# Patient Record
Sex: Female | Born: 1950
Health system: Southern US, Community
[De-identification: ages and names within clinical notes are randomized; demographics above are authoritative.]

## PROBLEM LIST (undated history)

## (undated) DIAGNOSIS — I10 Essential (primary) hypertension: Secondary | ICD-10-CM

## (undated) DIAGNOSIS — H269 Unspecified cataract: Secondary | ICD-10-CM

## (undated) DIAGNOSIS — E119 Type 2 diabetes mellitus without complications: Secondary | ICD-10-CM

## (undated) DIAGNOSIS — K5790 Diverticulosis of intestine, part unspecified, without perforation or abscess without bleeding: Secondary | ICD-10-CM

## (undated) DIAGNOSIS — G4730 Sleep apnea, unspecified: Secondary | ICD-10-CM

## (undated) DIAGNOSIS — F419 Anxiety disorder, unspecified: Secondary | ICD-10-CM

## (undated) DIAGNOSIS — M1712 Unilateral primary osteoarthritis, left knee: Secondary | ICD-10-CM

## (undated) DIAGNOSIS — F329 Major depressive disorder, single episode, unspecified: Secondary | ICD-10-CM

## (undated) DIAGNOSIS — F32A Depression, unspecified: Secondary | ICD-10-CM

## (undated) DIAGNOSIS — T7840XA Allergy, unspecified, initial encounter: Secondary | ICD-10-CM

## (undated) DIAGNOSIS — IMO0001 Reserved for inherently not codable concepts without codable children: Secondary | ICD-10-CM

## (undated) DIAGNOSIS — K219 Gastro-esophageal reflux disease without esophagitis: Secondary | ICD-10-CM

## (undated) HISTORY — DX: Allergy, unspecified, initial encounter: T78.40XA

## (undated) HISTORY — PX: JOINT REPLACEMENT: SHX530

## (undated) HISTORY — DX: Reserved for inherently not codable concepts without codable children: IMO0001

## (undated) HISTORY — DX: Unspecified cataract: H26.9

## (undated) HISTORY — DX: Anxiety disorder, unspecified: F41.9

## (undated) HISTORY — DX: Essential (primary) hypertension: I10

## (undated) HISTORY — DX: Diverticulosis of intestine, part unspecified, without perforation or abscess without bleeding: K57.90

## (undated) HISTORY — DX: Sleep apnea, unspecified: G47.30

---

## 2003-12-22 HISTORY — PX: BILATERAL SALPINGOOPHORECTOMY: SHX1223

## 2003-12-22 HISTORY — PX: CYSTOCELE REPAIR: SHX163

## 2003-12-22 HISTORY — PX: ABDOMINAL HYSTERECTOMY: SHX81

## 2005-08-27 ENCOUNTER — Ambulatory Visit: Payer: Self-pay | Admitting: Family Medicine

## 2005-10-21 LAB — HM COLONOSCOPY

## 2006-02-03 ENCOUNTER — Ambulatory Visit: Payer: Self-pay | Admitting: Family Medicine

## 2006-09-09 ENCOUNTER — Ambulatory Visit: Payer: Self-pay | Admitting: Family Medicine

## 2006-09-10 ENCOUNTER — Ambulatory Visit (HOSPITAL_COMMUNITY): Admission: RE | Admit: 2006-09-10 | Discharge: 2006-09-10 | Payer: Self-pay | Admitting: Family Medicine

## 2006-09-28 DIAGNOSIS — F339 Major depressive disorder, recurrent, unspecified: Secondary | ICD-10-CM

## 2006-09-28 HISTORY — DX: Major depressive disorder, recurrent, unspecified: F33.9

## 2006-10-06 ENCOUNTER — Ambulatory Visit: Payer: Self-pay | Admitting: Family Medicine

## 2006-12-29 ENCOUNTER — Encounter: Payer: Self-pay | Admitting: Family Medicine

## 2007-01-20 ENCOUNTER — Ambulatory Visit: Payer: Self-pay | Admitting: Family Medicine

## 2007-02-14 ENCOUNTER — Ambulatory Visit: Payer: Self-pay | Admitting: Family Medicine

## 2007-02-28 ENCOUNTER — Telehealth: Payer: Self-pay | Admitting: Family Medicine

## 2007-07-05 ENCOUNTER — Ambulatory Visit: Payer: Self-pay | Admitting: Family Medicine

## 2007-07-05 DIAGNOSIS — R002 Palpitations: Secondary | ICD-10-CM | POA: Insufficient documentation

## 2007-07-05 HISTORY — DX: Palpitations: R00.2

## 2007-07-06 ENCOUNTER — Encounter: Payer: Self-pay | Admitting: Family Medicine

## 2007-07-06 LAB — CONVERTED CEMR LAB
ALT: 18 units/L (ref 0–35)
AST: 16 units/L (ref 0–37)
Albumin: 4.4 g/dL (ref 3.5–5.2)
Alkaline Phosphatase: 61 units/L (ref 39–117)
BUN: 18 mg/dL (ref 6–23)
CO2: 25 meq/L (ref 19–32)
Calcium: 9.6 mg/dL (ref 8.4–10.5)
Chloride: 105 meq/L (ref 96–112)
Cholesterol, target level: 200 mg/dL
Cholesterol: 208 mg/dL — ABNORMAL HIGH (ref 0–200)
Creatinine, Ser: 0.95 mg/dL (ref 0.40–1.20)
Glucose, Bld: 94 mg/dL (ref 70–99)
HCT: 44 % (ref 36.0–46.0)
HDL goal, serum: 40 mg/dL
HDL: 58 mg/dL (ref >39)
Hemoglobin: 14.1 g/dL (ref 12.0–15.0)
LDL Cholesterol: 114 mg/dL — ABNORMAL HIGH (ref 0–99)
LDL Goal: 160 mg/dL
MCHC: 32 g/dL (ref 30.0–36.0)
MCV: 90.9 fL (ref 78.0–100.0)
Platelets: 273 10*3/uL (ref 150–400)
Potassium: 4.6 meq/L (ref 3.5–5.3)
RBC: 4.84 M/uL (ref 3.87–5.11)
RDW: 14.3 % — ABNORMAL HIGH (ref 11.5–14.0)
Sodium: 142 meq/L (ref 135–145)
TSH: 0.906 microintl units/mL (ref 0.350–5.50)
Total Bilirubin: 0.4 mg/dL (ref 0.3–1.2)
Total CHOL/HDL Ratio: 3.6
Total Protein: 7.2 g/dL (ref 6.0–8.3)
Triglycerides: 180 mg/dL — ABNORMAL HIGH (ref <150)
VLDL: 36 mg/dL (ref 0–40)
WBC: 6.7 10*3/uL (ref 4.0–10.5)

## 2007-07-14 ENCOUNTER — Encounter: Admission: RE | Admit: 2007-07-14 | Discharge: 2007-07-14 | Payer: Self-pay | Admitting: Family Medicine

## 2007-08-02 ENCOUNTER — Encounter: Admission: RE | Admit: 2007-08-02 | Discharge: 2007-08-02 | Payer: Self-pay | Admitting: Family Medicine

## 2007-09-19 ENCOUNTER — Ambulatory Visit: Payer: Self-pay | Admitting: Family Medicine

## 2007-10-04 ENCOUNTER — Ambulatory Visit: Payer: Self-pay | Admitting: Family Medicine

## 2008-02-16 ENCOUNTER — Ambulatory Visit: Payer: Self-pay | Admitting: Family Medicine

## 2008-02-16 LAB — CONVERTED CEMR LAB
Bilirubin Urine: NEGATIVE
Glucose, Urine, Semiquant: NEGATIVE
Ketones, urine, test strip: NEGATIVE
Nitrite: NEGATIVE
Protein, U semiquant: NEGATIVE
Specific Gravity, Urine: 1.025
Urobilinogen, UA: NEGATIVE
WBC Urine, dipstick: NEGATIVE
pH: 7

## 2008-02-17 ENCOUNTER — Encounter: Payer: Self-pay | Admitting: Family Medicine

## 2008-03-07 ENCOUNTER — Telehealth: Payer: Self-pay | Admitting: Family Medicine

## 2008-03-08 ENCOUNTER — Ambulatory Visit: Payer: Self-pay | Admitting: Family Medicine

## 2008-05-09 ENCOUNTER — Ambulatory Visit: Payer: Self-pay | Admitting: Family Medicine

## 2008-07-24 ENCOUNTER — Encounter: Payer: Self-pay | Admitting: Family Medicine

## 2008-07-24 ENCOUNTER — Telehealth: Payer: Self-pay | Admitting: Family Medicine

## 2008-07-24 DIAGNOSIS — E785 Hyperlipidemia, unspecified: Secondary | ICD-10-CM

## 2008-07-24 HISTORY — DX: Hyperlipidemia, unspecified: E78.5

## 2008-07-25 LAB — CONVERTED CEMR LAB
Cholesterol: 242 mg/dL — ABNORMAL HIGH (ref 0–200)
HDL: 61 mg/dL (ref >39)
LDL Cholesterol: 142 mg/dL — ABNORMAL HIGH (ref 0–99)
Total CHOL/HDL Ratio: 4
Triglycerides: 195 mg/dL — ABNORMAL HIGH (ref <150)
VLDL: 39 mg/dL (ref 0–40)

## 2008-08-06 ENCOUNTER — Ambulatory Visit: Payer: Self-pay | Admitting: Family Medicine

## 2008-08-20 ENCOUNTER — Encounter: Admission: RE | Admit: 2008-08-20 | Discharge: 2008-08-20 | Payer: Self-pay | Admitting: Family Medicine

## 2008-08-24 ENCOUNTER — Encounter: Admission: RE | Admit: 2008-08-24 | Discharge: 2008-08-24 | Payer: Self-pay | Admitting: Family Medicine

## 2008-09-25 ENCOUNTER — Ambulatory Visit: Payer: Self-pay | Admitting: Family Medicine

## 2008-10-01 ENCOUNTER — Telehealth: Payer: Self-pay | Admitting: Family Medicine

## 2008-10-11 ENCOUNTER — Telehealth: Payer: Self-pay | Admitting: Family Medicine

## 2009-01-03 ENCOUNTER — Ambulatory Visit: Payer: Self-pay | Admitting: Family Medicine

## 2009-02-04 ENCOUNTER — Ambulatory Visit: Payer: Self-pay | Admitting: Family Medicine

## 2009-02-12 ENCOUNTER — Telehealth: Payer: Self-pay | Admitting: Family Medicine

## 2009-06-25 ENCOUNTER — Ambulatory Visit: Payer: Self-pay | Admitting: Family Medicine

## 2009-06-26 ENCOUNTER — Telehealth: Payer: Self-pay | Admitting: Family Medicine

## 2009-06-27 ENCOUNTER — Encounter: Payer: Self-pay | Admitting: Family Medicine

## 2009-06-27 LAB — CONVERTED CEMR LAB
Clue Cells Wet Prep HPF POC: NONE SEEN
Trich, Wet Prep: NONE SEEN
Yeast Wet Prep HPF POC: NONE SEEN

## 2009-08-05 ENCOUNTER — Ambulatory Visit: Payer: Self-pay | Admitting: Family Medicine

## 2009-08-05 LAB — CONVERTED CEMR LAB
Bilirubin Urine: NEGATIVE
Glucose, Urine, Semiquant: NEGATIVE
Ketones, urine, test strip: NEGATIVE
Nitrite: NEGATIVE
Protein, U semiquant: NEGATIVE
Specific Gravity, Urine: 1.02
Urobilinogen, UA: 0.2
WBC Urine, dipstick: NEGATIVE
pH: 7

## 2009-08-06 ENCOUNTER — Encounter: Payer: Self-pay | Admitting: Family Medicine

## 2009-11-01 ENCOUNTER — Encounter: Admission: RE | Admit: 2009-11-01 | Discharge: 2009-11-01 | Payer: Self-pay | Admitting: Family Medicine

## 2009-12-11 ENCOUNTER — Ambulatory Visit: Payer: Self-pay | Admitting: Family Medicine

## 2009-12-11 ENCOUNTER — Encounter: Admission: RE | Admit: 2009-12-11 | Discharge: 2009-12-11 | Payer: Self-pay | Admitting: Family Medicine

## 2009-12-17 ENCOUNTER — Encounter: Payer: Self-pay | Admitting: Family Medicine

## 2010-01-12 ENCOUNTER — Encounter: Payer: Self-pay | Admitting: Family Medicine

## 2010-01-21 HISTORY — PX: SPINE SURGERY: SHX786

## 2010-01-24 ENCOUNTER — Telehealth: Payer: Self-pay | Admitting: Family Medicine

## 2010-05-29 ENCOUNTER — Ambulatory Visit: Payer: Self-pay | Admitting: Family

## 2010-06-24 ENCOUNTER — Telehealth: Payer: Self-pay

## 2010-06-27 ENCOUNTER — Telehealth (INDEPENDENT_AMBULATORY_CARE_PROVIDER_SITE_OTHER): Payer: Self-pay | Admitting: *Deleted

## 2010-11-05 ENCOUNTER — Ambulatory Visit: Payer: Self-pay | Admitting: Family Medicine

## 2010-11-05 DIAGNOSIS — N390 Urinary tract infection, site not specified: Secondary | ICD-10-CM | POA: Insufficient documentation

## 2010-11-14 ENCOUNTER — Encounter: Admission: RE | Admit: 2010-11-14 | Discharge: 2010-11-14 | Payer: Self-pay | Admitting: Family Medicine

## 2010-11-14 LAB — HM MAMMOGRAPHY: HM Mammogram: NEGATIVE

## 2010-12-23 ENCOUNTER — Ambulatory Visit
Admission: RE | Admit: 2010-12-23 | Discharge: 2010-12-23 | Payer: Self-pay | Source: Home / Self Care | Attending: Family Medicine | Admitting: Family Medicine

## 2010-12-23 DIAGNOSIS — J019 Acute sinusitis, unspecified: Secondary | ICD-10-CM | POA: Insufficient documentation

## 2011-01-11 ENCOUNTER — Encounter: Payer: Self-pay | Admitting: Family Medicine

## 2011-01-12 ENCOUNTER — Encounter: Payer: Self-pay | Admitting: Family Medicine

## 2011-01-20 NOTE — Assessment & Plan Note (Signed)
Summary: UTI, HRT   Vital Signs:  Patient profile:   60 year old female Height:      66 inches Weight:      210 pounds Pulse rate:   82 / minute BP sitting:   125 / 72  (right arm) Cuff size:   large  Vitals Entered By: Avon Gully CMA, Duncan Dull) (November 05, 2010 8:22 AM) CC: left kidney hurts x several days   Primary Care Provider:  Nani Gasser MD  CC:  left kidney hurts x several days.  History of Present Illness: left kidney hurts x several days. on the left side. Pain is intermittant. Feels better after drinks a pain.  Pain is a dull ache. yesterday pain was a 6/10. Today pain is 2/10.  Sometimes happens when gets urinary tract infection. No history of stones.  No blood in her urine. Change in positions don't bother her.l  Heal makes it feelt better. +urinary frequency and urgency.  No fever.    INterested in HRT. No family hx of breast cancer.   GEts her mammogram. Having hot flashes, hair changes, and vaginal drynesss. She has tried vaginal treatment alone but says it really doesn't treat her other sxs and would like to try oral tx.    Current Medications (verified): 1)  Paroxetine Hcl 20 Mg Tabs (Paroxetine Hcl) .... One and 1/2 By Mouth Daily 2)  Multivitamins  Tabs (Multiple Vitamin) .... Take 1 Tablet By Mouth Once A Day 3)  Caltrate 600+d 600-400 Mg-Unit Tabs (Calcium Carbonate-Vitamin D) .... One By Mouth Two Times A Day 4)  Flonase 50 Mcg/act  Susp (Fluticasone Propionate) .... 2 Sprays Per Nostril Daily 5)  Fish Oil 1000 Mg Caps (Omega-3 Fatty Acids) .... Take One Tablet By Mouth Once A Day 6)  Glucosamine-Chondroitin  Caps (Glucosamine-Chondroit-Vit C-Mn) .... Take One Capsule By Mouth Once A Day  Allergies (verified): 1)  ! Codeine  Comments:  Nurse/Medical Assistant: The patient's medications and allergies were reviewed with the patient and were updated in the Medication and Allergy Lists. Avon Gully CMA, Duncan Dull) (November 05, 2010 8:25  AM)  Past History:  Past Surgical History: Last updated: 05/29/2010 Cystocele bladder tack -2005  TAH/BSO -2005 by Dr. Kevin Fenton Lumbar laminectomy 2/11 (Dr. Manson Passey)  Family History: Last updated: 02/16/2008 Father - Alcoholism, depression, PAD Grandmother - Brain Ca, Mother - HTN,  Uncle-stroke Father died from blood clot from thrombosis of the leg.   GF died MI age 89.    Physical Exam  General:  Well-developed,well-nourished,in no acute distress; alert,appropriate and cooperative throughout examination Msk:  Lumbar flexion, extension and rotation right and left.  Nontender over the left flank or low back.  No CVA tenderness.    Impression & Recommendations:  Problem # 1:  UTI (ICD-599.0)  The following medications were removed from the medication list:    Ceftin 500 Mg Tabs (Cefuroxime axetil) ..... One tablet by mouth two times a day x 10 days Her updated medication list for this problem includes:    Bactrim Ds 800-160 Mg Tabs (Sulfamethoxazole-trimethoprim) .Marland Kitchen... Take 1 tablet by mouth two times a day for 3 days.  Encouraged to push clear liquids, get enough rest, and take acetaminophen as needed. To be seen in 10 days if no improvement, sooner if worse.  Orders: UA Dipstick w/o Micro (automated)  (81003)  Problem # 2:  HRT (ICD-V07.4) We will start with estrogen replacement since she has had a hystrectomy.  F/U in 2 months. We also discussed  bioidenticals which she is somewhat interested in.   Discussed inc risk of BrCa and will need to get her yearly mammogram.    Complete Medication List: 1)  Paroxetine Hcl 20 Mg Tabs (Paroxetine hcl) .... One and 1/2 by mouth daily 2)  Multivitamins Tabs (Multiple vitamin) .... Take 1 tablet by mouth once a day 3)  Caltrate 600+d 600-400 Mg-unit Tabs (Calcium carbonate-vitamin d) .... One by mouth two times a day 4)  Flonase 50 Mcg/act Susp (Fluticasone propionate) .... 2 sprays per nostril daily 5)  Fish Oil 1000 Mg Caps  (Omega-3 fatty acids) .... Take one tablet by mouth once a day 6)  Glucosamine-chondroitin Caps (Glucosamine-chondroit-vit c-mn) .... Take one capsule by mouth once a day 7)  Estrace 1 Mg Tabs (Estradiol) .... Take 1 tablet by mouth once a day 8)  Bactrim Ds 800-160 Mg Tabs (Sulfamethoxazole-trimethoprim) .... Take 1 tablet by mouth two times a day for 3 days.  Patient Instructions: 1)  Follow in about 2 months for HRT.   2)  Can read about bioidenticals.  Prescriptions: BACTRIM DS 800-160 MG TABS (SULFAMETHOXAZOLE-TRIMETHOPRIM) Take 1 tablet by mouth two times a day for 3 days.  #6 x 0   Entered and Authorized by:   Nani Gasser MD   Signed by:   Nani Gasser MD on 11/05/2010   Method used:   [facility]        [facility] [phone][facility] (retail[facility]       [address] [place], [place]  [address]       Ph: [phone] or [phone]       Fax: [phone]   RxID:   [id number] ESTRACE 1 MG TABS (ESTRADIOL) Take 1 tablet by mouth once a day  #30 x 2   Entered and Authorized by:   Nani Gasser MD   Signed by:   Nani Gasser MD on 11/05/2010   Method used:   [facility] to        [facility] [phone]* (retail)       [address] [place], [place]  [address]       Ph: [phone] or [phone]       Fax: [phone]   RxID:   [id number]    Orders Added: 1)  UA Dipstick w/o Micro (automated)  [81003] 2)  Est. Patient Level IV [61607]   Immunization History:  Influenza Immunization History:    Influenza:  historical (09/22/2010)   Immunization History:  Influenza Immunization History:    Influenza:  Historical (09/22/2010)  Laboratory Results   Urine Tests  Date/Time Received: 11/05/10 Date/Time Reported: 11/05/10         Immunization History:  Influenza Immunization History:    Influenza:  historical (09/22/2010)  Appended Document: UTI, HRT    Clinical Lists Changes  Observations: Added new  observation of WBC DIPSTK U: negative (11/05/2010 8:45) Added new observation of NITRITE URN: negative (11/05/2010 8:45) Added new observation of UROBILINOGEN: 0.2  (11/05/2010 8:45) Added new observation of PROTEIN, URN: negative  (11/05/2010 8:45) Added new observation of PH URINE: 7.0  (11/05/2010 8:45) Added new observation of BLOOD UR DIP: trace-intact  (11/05/2010 8:45) Added new observation of SPEC GR URIN: 1.020  (11/05/2010 8:45) Added new observation of KETONES URN: negative  (11/05/2010 8:45) Added new observation of BILIRUBIN UR: negative  (11/05/2010 8:45) Added new observation of GLUCOSE, URN: negative  (11/05/2010 8:45)  Added new observation of APPEARANCE U: Clear  (11/05/2010 8:45) Added new observation of UA COLOR: yellow  (11/05/2010 8:45)      Laboratory Results   Urine Tests  Date/Time Received: 11/05/10 Date/Time Reported: 11/05/10  Routine Urinalysis   Color: yellow Appearance: Clear Glucose: negative   (Normal Range: Negative) Bilirubin: negative   (Normal Range: Negative) Ketone: negative   (Normal Range: Negative) Spec. Gravity: 1.020   (Normal Range: 1.003-1.035) Blood: trace-intact   (Normal Range: Negative) pH: 7.0   (Normal Range: 5.0-8.0) Protein: negative   (Normal Range: Negative) Urobilinogen: 0.2   (Normal Range: 0-1) Nitrite: negative   (Normal Range: Negative) Leukocyte Esterace: negative   (Normal Range: Negative)

## 2011-01-20 NOTE — Assessment & Plan Note (Signed)
Summary: sinus pain/cough   Vital Signs:  Patient Profile:   60 Years Old Female Height:     66 inches Weight:      211 pounds O2 Sat:      95 % Temp:     98.0 degrees F oral Pulse rate:   73 / minute BP sitting:   127 / 81  (left arm) Cuff size:   large  Vitals Entered By: Harlene Salts (February 14, 2007 10:34 AM)               PCP:  Cipriano Bunker  Chief Complaint:  Cold & URI symptoms.  History of Present Illness: 1 wk ago head congestion, runny nose cough, dry- Vicks at night no SOB, no CP dizzy maxillary sinus pain- Dayquil, fevers and chills good by mouth intake cough keeps her up at night.  Acute Visit History:      The patient complains of chest pain, cough, fever, headache, nasal discharge, and sinus problems.  These symptoms began 6 days ago.  She denies abdominal pain, diarrhea, earache, musculoskeletal symptoms, nausea, sore throat, and vomiting.  Other comments include: CP with cough only, Dayquil not helping much, hx of tobacco abuse, SOB with cough at night, sinus pressure in both cheeks.        Her highest temperature has been subj.  The fever has been off and on.        The cough interferes with her sleep.  The character of the cough is described as nonproductive.  She has no history of COPD.  There is no history of wheezing, shortness of breath, or interference with oral intake associated with her cough.        She complains of sinus pressure and nasal congestion.  The patient has had a past history of sinusitis.  She denies previous sinus surgery or sinusitis in the last 2 months.        Prior Medications: PAROXETINE HCL 20 MG TABS (PAROXETINE HCL) one and 1/2 by mouth daily MULTIVITAMINS  TABS (MULTIPLE VITAMIN) Take 1 tablet by mouth once a day Current Allergies: ! CODEINE      Review of Systems      See HPI   Physical Exam  General:     alert and well-hydrated.  in mild distress Head:     normocephalic and atraumatic.  maxillary sinuses  are TTP Eyes:     conjunctiva clear, EOMI and non-painful Ears:     TMs appear normal Nose:     clear rhinorrhea Mouth:     good dentition and pharynx pink and moist.  o/p injected, no exudates Neck:     no masses.   Lungs:     dry hacking cough, spliting, no W/R/R; no accessory muscle use.   Heart:     Normal rate and regular rhythm. S1 and S2 normal without gallop, murmur, click, rub or other extra sounds. Skin:     color normal and no rashes.   Cervical Nodes:     anterior cervical LA    Impression & Recommendations:  Problem # 1:  SINUSITIS, ACUTE NOS (ICD-461.9) Likely viral.  Disucssed supportive care.  Will give Zithromax Rx to start later this wk if needed, after 10 days of illness to take if sinus pain and congestion does not improve.   Her updated medication list for this problem includes:    Zithromax Z-pak 250 Mg Tabs (Azithromycin) .Marland Kitchen... Take as directed    Tussionex Pennkinetic Er 8-10  Mg/70ml Lqcr (Chlorpheniramine-hydrocodone) .Marland Kitchen... 1 tsp by mouth q 12 hr as needed cough   Problem # 2:  SYMPTOM, COUGH (ICD-786.2) Pulse ox 95% with hx of smoking.  Normal lung exam.  Likely viral as per #1.   Orders: Pulse Oximetry (94760)   Medications Added to Medication List This Visit: 1)  Caltrate 600+d 600-400 Mg-unit Tabs (Calcium carbonate-vitamin d) .... One by mouth two times a day 2)  Zithromax Z-pak 250 Mg Tabs (Azithromycin) .... Take as directed 3)  Tussionex Pennkinetic Er 8-10 Mg/28ml Lqcr (Chlorpheniramine-hydrocodone) .Marland Kitchen.. 1 tsp by mouth q 12 hr as needed cough 4)  Ventolin Hfa 108 (90 Base) Mcg/act Aers (Albuterol sulfate) .... 2 puffs q 4 hrs prn   Patient Instructions: 1)  Use RX cough medicine -- may make you drowsy.  do not combine with Dayquil. 2)  Can use Tylenol or Ibuprofen for headache as needed. 3)  Use inhaler every 4 hrs during the day for 10 days. 4)  Fill Zithromax on Wed if not getting better. 5)  F/u with Dr Linford Arnold in not better by 2  wks.

## 2011-01-20 NOTE — Progress Notes (Signed)
Summary: REFERRAL  Phone Note Call from Patient Call back at [phone]   Reason for Call: Referral Summary of Call: PATIENT WOULD LIKE A REFERRAL TO AN ENT FOR HER EAR PROBLEMS. Initial call taken by: Drinda Butts,  February 28, 2007 10:50 AM  Follow-up for Phone Call        Unaware of ear problem. Please get details.  Follow-up by: Nani Gasser MD,  February 28, 2007 11:06 AM  Additional Follow-up for Phone Call Additional follow up Details #1::        Called pt. she states feels like her right ear won't open-pressure in it. Says she can hear fluid in it when she blows her nose. Left ear hurts also bu not as bad as the right ear. Has never had ear problems before, using Advil for the pain. [facility] LPN @ 29:56OZ Additional Follow-up by: Kathlene November,  February 28, 2007 11:14 AM   Additional Follow-up for Phone Call Additional follow up Details #2::    Recommend try Claritin D to help dry up fluid, if feels confident there is not infection. If after 1 week, no better will refer to ENT, but it sounds like eustachain tube dysfucntion and this can take 2-4 weeks to get better.  This can often happen after a cold or virus.   Follow-up by: Nani Gasser MD,  February 28, 2007 11:23 AM  Additional Follow-up for Phone Call Additional follow up Details #3:: Details for Additional Follow-up Action Taken: Called pt. gave MD instructions to her. Will try the Claritin D for a week and will call back in 1 week if no better. [facility] LPN @ 30:86VH Additional Follow-up by: Kathlene November,  February 28, 2007 11:37 AM

## 2011-01-20 NOTE — Assessment & Plan Note (Signed)
Summary: Gastritis   Vital Signs:  Patient Profile:   60 Years Old Female Height:     66 inches Weight:      210 pounds BMI:     34.02 Temp:     97.1 degrees F oral Pulse rate:   63 / minute BP sitting:   122 / 77  (left arm) Cuff size:   large  Vitals Entered By: Harlene Salts (January 20, 2007 2:58 PM)                  PCP:  Cipriano Bunker  Chief Complaint:  diarrhea and N/V.  History of Present Illness:       This is a 60 years old female who presents with Diarrhea.  The symptoms began 4 days ago.  Started with nausea and vomiting. Both improved bu tnow increased stomach acid.  Took something years ago.  Still eating bland food.  Now increasing fluids.  Want something for the acid.    Prior Medications: PAROXETINE HCL 20 MG TABS (PAROXETINE HCL) one and 1/2 by mouth daily MULTIVITAMINS  TABS (MULTIPLE VITAMIN) Take 1 tablet by mouth once a day Current Allergies: ! CODEINE        Impression & Recommendations:  Problem # 1:  GASTRITIS, ACUTE (ICD-535.00) Will Use PPI for 3 weeks if not improved call. Increase fluids.

## 2011-01-20 NOTE — Progress Notes (Signed)
Summary: Yeast infection  Phone Note Call from Patient Call back at Home Phone [phone]   Caller: Patient Call For: Nani Gasser MD Summary of Call: Pt calls and has been on antibiotic by you and has a "raging" yeast infection. Has been using OTC Monistat but no help actually getting worse. Would like to know if you would call in [facility] [address] [facility] [place] Initial call taken by: Kathlene November,  February 12, 2009 10:16 AM  Follow-up for Phone Call        Rx sent.  REmind her take 2-3 days to work after takes the dose of diflucan Follow-up by: Nani Gasser MD,  February 12, 2009 10:46 AM    New/Updated Medications: FLUCONAZOLE 150 MG TABS (FLUCONAZOLE) Take 1 tablet by mouth once a day x 1   Prescriptions: FLUCONAZOLE 150 MG TABS (FLUCONAZOLE) Take 1 tablet by mouth once a day x 1  #1 x 1   Entered and Authorized by:   Nani Gasser MD   Signed by:   Nani Gasser MD on 02/12/2009   Method used:   Electronically to        [facility] [phone]* (retail)       [address] [place], [place]  [address]       Ph: [phone] or [phone]       Fax: [phone]   RxID:   [id number]

## 2011-01-20 NOTE — Assessment & Plan Note (Signed)
Summary: FU ED Visit for Palpitations   Vital Signs:  Patient Profile:   60 Years Old Female Height:     66 inches Weight:      213 pounds Temp:     98.0 degrees F oral Pulse rate:   79 / minute BP sitting:   125 / 61  (left arm) Cuff size:   regular  Vitals Entered By: Kathlene November (September 19, 2007 1:39 PM)                 PCP:  Cipriano Bunker  Chief Complaint:  Followup from ED on Saturday. Had palpitations. States MD said it was firing but the muscle wasn't reacting and told to follow up with you on Monday.  History of Present Illness: Felt bad for about 3 days, but couldn't "put her finger on it". Then 2 days woke up and had a small cup of coffee.  Felt likd heart was beating so hard felt like it was in her throat.  Felt pulse and noticed occassional skipping. No pain, but bothersome. Called RN thorugh her insurance who recommend go to ED.  Went to ED and they placed her on a monitor and says could see occassional skips.  Nurse was Galen Daft.  Called them PVCs.  Yesterday felt better but still some symptoms.  Today feels much better.  Did take Vioxx for a year so is concerned about her heart. Reviewed ED records. Will scan in.   has noticed them before with caffeine intake but usually only lasted shortly.    Current Allergies: ! CODEINE   Family History:    Father - Alcoholism, depression    Grandmother - Brain Ca, Mother - HTN,     Uncle-stroke    Father died from blood clot from thrombosis of the leg.      GF died MI age 8.       Physical Exam  General:     Well-developed,well-nourished,in no acute distress; alert,appropriate and cooperative throughout examination Head:     Normocephalic and atraumatic without obvious abnormalities. No apparent alopecia or balding. Eyes:     No corneal or conjunctival inflammation noted. EOMI. Perrla. Lungs:     Normal respiratory effort, chest expands symmetrically. Lungs are clear to auscultation, no crackles or  wheezes. Heart:     Normal rate and regular rhythm. S1 and S2 normal without gallop, murmur, click, rub or other extra sounds. Pulses:     Radial 2+ Extremities:     No LE edema    Impression & Recommendations:  Problem # 1:  PALPITATIONS (ICD-785.1) Will set up for arrythmia monitor. When get results back will set up for stress test as well.   Encouraged her to stop all caffeine.   Can try vasovagel manuver if happens again.   Complete Medication List: 1)  Paroxetine Hcl 20 Mg Tabs (Paroxetine hcl) .... One and 1/2 by mouth daily 2)  Multivitamins Tabs (Multiple vitamin) .... Take 1 tablet by mouth once a day 3)  Caltrate 600+d 600-400 Mg-unit Tabs (Calcium carbonate-vitamin d) .... One by mouth two times a day     ]

## 2011-01-20 NOTE — Progress Notes (Signed)
Summary: Fever Blisters  Phone Note Call from Patient   Summary of Call: Pt has fever blisters on her lips and would like something called in. She has had them on/off since a child.  Initial call taken by: Nani Gasser MD,  March 07, 2008 10:22 AM    New/Updated Medications: ACYCLOVIR 400 MG  TABS (ACYCLOVIR) Take 1 tablet by mouth three times a day for 5 days.   Prescriptions: ACYCLOVIR 400 MG  TABS (ACYCLOVIR) Take 1 tablet by mouth three times a day for 5 days.  #15 x 2   Entered and Authorized by:   Nani Gasser MD   Signed by:   Nani Gasser MD on 03/07/2008   Method used:   Elect[facility] to ...       [facility]  [address]       [address] [place], [place]  [address]       Ph: [phone] or [phone]       Fax: [phone]   RxID:   [id number]

## 2011-01-20 NOTE — Assessment & Plan Note (Signed)
Summary: Allergic Conjunctivitis   Vital Signs:  Patient Profile:   60 Years Old Female Height:     66 inches Weight:      216 pounds Pulse rate:   58 / minute BP sitting:   127 / 63  (left arm) Cuff size:   regular  Vitals Entered By: Kathlene November (September 25, 2008 9:54 AM)                 PCP:  Nani Gasser MD  Chief Complaint:  red, itchy, watery eyes, and draining clear fluid.  History of Present Illness: Eye have been water, itcy. No pain. Says eyes feel sore now as she has been rubbing them so much. No vision change. NO fever or URI sxs. Has tried several OTC oral antihistamine medication. Tried the allergy eye medication by claritin. Tried changing makeup to see if helped. Wears glasses but not constant.     Current Allergies: ! CODEINE      Physical Exam  General:     Well-developed,well-nourished,in no acute distress; alert,appropriate and cooperative throughout examination Head:     Normocephalic and atraumatic without obvious abnormalities. No apparent alopecia or balding. Eyes:     Lids with mild edema. Sclera is injected. Watering.  PEERLA.     Impression & Recommendations:  Problem # 1:  ALLERGIC CONJUNCTIVITIS (ICD-372.14) Discussed dx.  If not getting better with Patanol then call the office and will refer to optho for further eval.   Complete Medication List: 1)  Paroxetine Hcl 20 Mg Tabs (Paroxetine hcl) .... One and 1/2 by mouth daily 2)  Multivitamins Tabs (Multiple vitamin) .... Take 1 tablet by mouth once a day 3)  Caltrate 600+d 600-400 Mg-unit Tabs (Calcium carbonate-vitamin d) .... One by mouth two times a day 4)  Optivar 0.05 % Soln (Azelastine hcl) .Marland Kitchen.. 1 drop in both eyes twice daily 5)  Flonase 50 Mcg/act Susp (Fluticasone propionate) .... 2 sprays per nostril daily 6)  Acyclovir 400 Mg Tabs (Acyclovir) .... Take 1 tablet by mouth three times a day for 5 days. 7)  Patanol 0.1 % Soln (Olopatadine hcl) .Marland Kitchen.. 1 gtt in each eye  two times a day    Prescriptions: PATANOL 0.1 % SOLN (OLOPATADINE HCL) 1 gtt in each eye two times a day  #1 bottle x 0   Entered and Authorized by:   Nani Gasser MD   Signed by:   Nani Gasser MD on 09/25/2008   Method used:   Electronically to        [facility] [phone]* (retail)       [address] [place], [place]  [address]       Ph: [phone] or [phone]       Fax: [phone]   RxID:   [id number]  ]  Influenza Immunization History:    Influenza # 1:  Historical (09/11/2008)

## 2011-01-20 NOTE — Assessment & Plan Note (Signed)
Summary: ? Bronchitis- jr--Rm 5   Vital Signs:  Patient profile:   60 year old female Height:      66 inches Weight:      209 pounds BMI:     33.86 Temp:     98.0 degrees F oral Pulse rate:   66 / minute Pulse rhythm:   regular Resp:     16 per minute BP sitting:   146 / 80  (right arm) Cuff size:   large  Vitals Entered By: Mervin Kung CMA (May 29, 2010 9:46 AM) CC: Room 5  Productive cough and head congestion x 1 week. Face and teeth hurt, reports fever at home 100. Is Patient Diabetic? No   Primary Care Provider:  Nani Gasser MD  CC:  Room 5  Productive cough and head congestion x 1 week. Face and teeth hurt and reports fever at home 100.Marland Kitchen  History of Present Illness: Ms Yvonne Long is a 60 year old female who presents today with complaint of chest congestion/cough productive of yellow sputum.  Symptoms started last saturday.  She had fever on Saturday night (100).  Also notes + sinus pressure, tooth pain.  White nasal discharge- started Wednesday.  Has + sick contacts at work.  She has tried Daquil/Nyquil with temporary improvement.    Allergies: 1)  ! Codeine  Past History:  Past Medical History: Last updated: 12/29/2006 G3 P2012  Hx Diverticulum  Family History: Last updated: 02/16/2008 Father - Alcoholism, depression, PAD Grandmother - Brain Ca, Mother - HTN,  Uncle-stroke Father died from blood clot from thrombosis of the leg.   GF died MI age 21.    Social History: Last updated: 09/28/2006 Lives with her husband, Ardine Bjork, who is in [employer].  They have moved frequently but she is originally from [place].  Quit smoking 1996.  Drinks one alchoholic drink per day. 1 caffeinated drink/day.   Denies drug use.  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  Enjoys yardwork & yoga.  Risk Factors: Smoking Status: quit (12/29/2006)  Past Surgical History: Cystocele bladder tack -2005  TAH/BSO -2005 by Dr.  Kevin Fenton Lumbar laminectomy 2/11 (Dr. Manson Passey)  Physical Exam  General:  Well-developed,well-nourished, tired appearing white female, alert,appropriate and cooperative throughout examination Head:  Normocephalic and atraumatic without obvious abnormalities. No apparent alopecia or balding.+ maxillary tenderness to palpation L>R Eyes:  PERRLA Ears:  Bilateral TM's pink with mild bulging noted Mouth:  Oral mucosa and oropharynx without lesions or exudates.  Teeth in good repair. Neck:  No deformities, masses, or tenderness noted. Lungs:  Normal respiratory effort, chest expands symmetrically. Lungs are clear to auscultation, no crackles or wheezes. Heart:  Normal rate and regular rhythm. S1 and S2 normal without gallop, murmur, click, rub or other extra sounds.   Impression & Recommendations:  Problem # 1:  SINUSITIS (ICD-473.9) Assessment New Suspect + sinusitis, mild bilateral OM and Bronchitis.  Will plan to treat with Ceftin x 10 days Her updated medication list for this problem includes:    Flonase 50 Mcg/act Susp (Fluticasone propionate) .Marland Kitchen... 2 sprays per nostril daily    Vicks Dayquil Cough 15 Mg/68ml Liqd (Dextromethorphan hbr) .Marland Kitchen... As needed.    Ceftin 500 Mg Tabs (Cefuroxime axetil) ..... One tablet by mouth two times a day x 10 days  Problem # 2:  BRONCHITIS (ICD-490) Assessment: New  Her updated medication list for this problem includes:    Vicks Dayquil Cough 15 Mg/55ml  Liqd (Dextromethorphan hbr) .Marland Kitchen... As needed.    Ceftin 500 Mg Tabs (Cefuroxime axetil) ..... One tablet by mouth two times a day x 10 days  Complete Medication List: 1)  Paroxetine Hcl 20 Mg Tabs (Paroxetine hcl) .... One and 1/2 by mouth daily 2)  Multivitamins Tabs (Multiple vitamin) .... Take 1 tablet by mouth once a day 3)  Caltrate 600+d 600-400 Mg-unit Tabs (Calcium carbonate-vitamin d) .... One by mouth two times a day 4)  Flonase 50 Mcg/act Susp (Fluticasone propionate) .... 2 sprays per nostril  daily 5)  Fish Oil 1000 Mg Caps (Omega-3 fatty acids) .... Take one tablet by mouth once a day 6)  Glucosamine-chondroitin Caps (Glucosamine-chondroit-vit c-mn) .... Take one capsule by mouth once a day 7)  Fluconazole 150 Mg Tabs (Fluconazole) .... Take 1 tablet by mouth once a day 8)  Vicks Dayquil Cough 15 Mg/45ml Liqd (Dextromethorphan hbr) .... As needed. 9)  Ceftin 500 Mg Tabs (Cefuroxime axetil) .... One tablet by mouth two times a day x 10 days  Patient Instructions: 1)   Call if you develop fever over 101, increasing sinus pressure, pain with eye movement, increased facial tenderness of swelling, or if you develop visual changes. 2)  Please follow up in 1 week. Prescriptions: CEFTIN 500 MG TABS (CEFUROXIME AXETIL) one tablet by mouth two times a day x 10 days  #20 x 0   Entered and Authorized by:   Lemont Fillers FNP   Signed by:   Lemont Fillers FNP on 05/29/2010   Method used:   [facility]        [facility] [phone][facility] (retail)       [address] [place], [place]  [address]       Ph: [phone] or [phone]       Fax: [phone]   RxID:   [id number]   Current Allergies (reviewed today): ! CODEINE

## 2011-01-20 NOTE — Assessment & Plan Note (Signed)
Summary: UTI   Vital Signs:  Patient profile:   60 year old female Height:      66 inches Weight:      219 pounds Pulse rate:   72 / minute BP sitting:   128 / 68  (left arm) Cuff size:   large  Vitals Entered By: Kathlene November (August 05, 2009 12:58 PM) CC: left sided back pain, frequency and urgency, oliguria   Primary Care Provider:  Nani Gasser MD  CC:  left sided back pain, frequency and urgency, and oliguria.  History of Present Illness: left sided back pain, frequency and urgency, oliguria.  Sxs started about 4 days ago.  No fever or nausea. Did have a UTI about 1 month ago that grew out e coli and enterococcus. She was worried it is back. No worsening or alleviatng signs. Does feel a little better today.   Current Medications (verified): 1)  Paroxetine Hcl 20 Mg Tabs (Paroxetine Hcl) .... One and 1/2 By Mouth Daily 2)  Multivitamins  Tabs (Multiple Vitamin) .... Take 1 Tablet By Mouth Once A Day 3)  Caltrate 600+d 600-400 Mg-Unit Tabs (Calcium Carbonate-Vitamin D) .... One By Mouth Two Times A Day 4)  Optivar 0.05 %  Soln (Azelastine Hcl) .Marland Kitchen.. 1 Drop in Both Eyes Twice Daily 5)  Flonase 50 Mcg/act  Susp (Fluticasone Propionate) .... 2 Sprays Per Nostril Daily 6)  Acyclovir 400 Mg  Tabs (Acyclovir) .... Take 1 Tablet By Mouth Three Times A Day For 5 Days.  Allergies (verified): 1)  ! Codeine  Comments:  Nurse/Medical Assistant: The patient's medications and allergies were reviewed with the patient and were updated in the Medication and Allergy Lists. Kathlene November (August 05, 2009 1:04 PM)   Impression & Recommendations:  Problem # 1:  UTI (ICD-599.0) Will send a cx since just had an injection less than 1 mo ago.  The following medications were removed from the medication list:    Macrodantin 50 Mg Caps (Nitrofurantoin macrocrystal) .Marland Kitchen... Take 1 tablet by mouth four times a day for 7 days Her updated medication list for this problem includes:    Cipro 500 Mg  Tabs (Ciprofloxacin hcl) .Marland Kitchen... Take 1 tablet by mouth two times a day for 3 days  Orders: UA Dipstick w/o Micro (automated)  (81003) T-Culture, Urine ([address])  Encouraged to push clear liquids, get enough rest, and take acetaminophen as needed. To be seen in 10 days if no improvement, sooner if worse.  Complete Medication List: 1)  Paroxetine Hcl 20 Mg Tabs (Paroxetine hcl) .... One and 1/2 by mouth daily 2)  Multivitamins Tabs (Multiple vitamin) .... Take 1 tablet by mouth once a day 3)  Caltrate 600+d 600-400 Mg-unit Tabs (Calcium carbonate-vitamin d) .... One by mouth two times a day 4)  Optivar 0.05 % Soln (Azelastine hcl) .Marland Kitchen.. 1 drop in both eyes twice daily 5)  Flonase 50 Mcg/act Susp (Fluticasone propionate) .... 2 sprays per nostril daily 6)  Acyclovir 400 Mg Tabs (Acyclovir) .... Take 1 tablet by mouth three times a day for 5 days. 7)  Cipro 500 Mg Tabs (Ciprofloxacin hcl) .... Take 1 tablet by mouth two times a day for 3 days Prescriptions: CIPRO 500 MG TABS (CIPROFLOXACIN HCL) Take 1 tablet by mouth two times a day for 3 days  #6 x 0   Entered and Authorized by:   Nani Gasser MD   Signed by:   Nani Gasser MD on 08/05/2009   Method used:   [facility]  [facility]        [facility]  [address] [phone]* (retail)       [address] [place], [place]  [address]       Ph: [phone] or [phone]       Fax: [phone]   RxID:   [id number]   Laboratory Results   Urine Tests  Date/Time Received: 08/05/2009 Date/Time Reported: 08/05/2009  Routine Urinalysis   Color: yellow Appearance: Clear Glucose: negative   (Normal Range: Negative) Bilirubin: negative   (Normal Range: Negative) Ketone: negative   (Normal Range: Negative) Spec. Gravity: 1.020   (Normal Range: 1.003-1.035) Blood: trace-intact   (Normal Range: Negative) pH: 7.0   (Normal Range: 5.0-8.0) Protein: negative   (Normal Range: Negative) Urobilinogen: 0.2   (Normal Range: 0-1) Nitrite:  negative   (Normal Range: Negative) Leukocyte Esterace: negative   (Normal Range: Negative)

## 2011-01-20 NOTE — Assessment & Plan Note (Signed)
Summary: allergy eyes   Vital Signs:  Patient Profile:   60 Years Old Female Height:     66 inches Weight:      210 pounds Temp:     97.4 degrees F oral Pulse rate:   70 / minute BP sitting:   118 / 67  (left arm) Cuff size:   regular  Vitals Entered By: Harlene Salts (October 04, 2007 8:04 AM)              Vision Screening: Left eye w/o correction: 20 / 25 Right Eye w/o correction: 20 / 25 Both eyes w/o correction:  20/ 20  Color vision testing: normal      Vision Entered By: Harlene Salts (October 04, 2007 8:25 AM)   Visit Type:  acute PCP:  Nani Gasser MD  Chief Complaint:  left eye red and draining.  History of Present Illness: 60 yo WF seen for itchy red L>R eye x 2 wks.  Watery and a little itchy, but not painful.  Clear drainage only.  Has concurrent allergic rhinitis, taking Claritin D.  Using Cortisone cream on her upper eyelid which burns.  Visine for allergies helps.  A little blurry vision from L eye.  Denies hx of glaucoma or foreign body.  No scratchy sensation with blinking.    Current Allergies: ! CODEINE     Review of Systems      See HPI   Physical Exam  General:     alert, well-developed, well-nourished, and well-hydrated.   Head:     normocephalic and atraumatic.   Eyes:     L upper and lower eyelid a little red, no edema.  conjunctiva mildly injected with cobblestoning.  Eyes both a little watery.  PERRLA, EOMI without pain.   Nose:     clear rhinorrhea with boggy turbinates Mouth:     good dentition and pharynx pink and moist.   Neck:     no masses.   Lungs:     Normal respiratory effort, chest expands symmetrically. Lungs are clear to auscultation, no crackles or wheezes. Heart:     Normal rate and regular rhythm. S1 and S2 normal without gallop, murmur, click, rub or other extra sounds. Skin:     color normal and no rashes.   Cervical Nodes:     No lymphadenopathy noted    Impression &  Recommendations:  Problem # 1:  CONJUNCTIVITIS, ALLERGIC (ICD-372.14) Treat with Optivar.  Cool compresses.  Equal bilat vision.  Claritin D for allergic rhinitis.  Complete Medication List: 1)  Paroxetine Hcl 20 Mg Tabs (Paroxetine hcl) .... One and 1/2 by mouth daily 2)  Multivitamins Tabs (Multiple vitamin) .... Take 1 tablet by mouth once a day 3)  Caltrate 600+d 600-400 Mg-unit Tabs (Calcium carbonate-vitamin d) .... One by mouth two times a day 4)  Optivar 0.05 % Soln (Azelastine hcl) .Marland Kitchen.. 1 drop in both eyes twice daily  Other Orders: Flu Vaccine 69yrs + (30865) Admin 1st Vaccine (78469)   Patient Instructions: 1)  Begin using Optivar 1 drop in each eye twice daily until allergy symptoms improve. 2)  Cool compresses. 3)  Claritin D as needed.    Prescriptions: OPTIVAR 0.05 %  SOLN (AZELASTINE HCL) 1 drop in both eyes twice daily  #1 bottle x 1   Entered and Authorized by:   Seymour Bars DO   Signed by:   Seymour Bars DO on 10/04/2007   Method used:   Electronically sent to .[facility]land Kitchen[facility]land Kitchen  [facility] [address]       [address] [place], [place]  [address]       Ph: [phone] or [phone]       Fax: [phone]   RxID:   [id number]  ]  Influenza Vaccine    Vaccine Type: Fluvax 3+    Site: left deltoid    Mfr: NOVARTIS    Dose: 0.5 ml    Route: IM    Given by: Harlene Salts    Exp. Date: 06/19/2008    Lot #: [id number]    VIS given: 07/14/07  Flu Vaccine Consent Questions    Do you have a history of severe allergic reactions to this vaccine? no    Any prior history of allergic reactions to egg and/or gelatin? no    Do you have a sensitivity to the preservative Thimersol? no    Do you have a past history of Guillan-Barre Syndrome? no    Do you currently have an acute febrile illness? no    Have you ever had a severe reaction to latex? no    Vaccine information given and explained to patient? yes    Are you currently pregnant? no  Appended  Document: allergy eyes Prescriptions: VERAMYST 27.5 MCG/SPRAY  SUSP (FLUTICASONE FUROATE) 2 sprays per nostril daily  #1 x 2   Entered by:   Harlene Salts   Authorized by:   Seymour Bars DO   Signed by:   Harlene Salts on 10/04/2007   Method used:   Electronically sent to ...       [facility] [address]       [address] [place], [place]  [address]       Ph: [phone] or [phone]       Fax: [phone]   RxID:   [id number]

## 2011-01-20 NOTE — Progress Notes (Signed)
Summary: yeast infection  Phone Note Call from Patient   Caller: Patient Summary of Call: Dr.Metheney    Call Back  [phone]  Patient left a message, she said she has a yeast inf and want to know if Monistat can be called in for her.     [facility] Initial call taken by: Vanessa Swaz[place],  January 24, 2010 9:23 AM  Follow-up for Phone Call        Monistat is OTC.  Doesn' t need a rx.  Follow-up by: Nani Gasser MD,  January 24, 2010 9:56 AM  Additional Follow-up for Phone Call Additional follow up Details #1::        pt would like a rx for diflucan not monistat.pls advise ok to fill med...........Marland KitchenFelecia Deloach CMA  January 24, 2010 3:46 PM     Additional Follow-up for Phone Call Additional follow up Details #2::    OK, sent rx.  Follow-up by: Nani Gasser MD,  January 24, 2010 4:00 PM  Additional Follow-up for Phone Call Additional follow up Details #3:: Details for Additional Follow-up Action Taken: left pt detail message rx sent to pharmacy...................Marland KitchenFelecia Deloach CMA  January 24, 2010 4:05 PM   New/Updated Medications: FLUCONAZOLE 150 MG TABS (FLUCONAZOLE) Take 1 tablet by mouth once a day Prescriptions: FLUCONAZOLE 150 MG TABS (FLUCONAZOLE) Take 1 tablet by mouth once a day  #1 x 0   Entered and Authorized by:   Nani Gasser MD   Signed by:   Nani Gasser MD on 01/24/2010   Method used:   [facility]ronically to        [facility] [phone]* (retail)       [address] [place], [place]  [address]       Ph: [phone] or [phone]       Fax: [phone]   RxID:   [id number]

## 2011-01-20 NOTE — Assessment & Plan Note (Signed)
Summary: Heel Pain, Sciatica   Vital Signs:  Patient profile:   60 year old female Height:      66 inches Weight:      222 pounds Pulse rate:   69 / minute BP sitting:   129 / 64  (left arm) Cuff size:   large  Vitals Entered By: Kathlene November (December 11, 2009 2:30 PM) CC: sciatic nerve pain in right leg. Also has heel pain bilaterally   Primary Care Provider:  Nani Gasser MD  CC:  sciatic nerve pain in right leg. Also has heel pain bilaterally.  History of Present Illness: sciatic nerve pain in right leg. Also has heel pain bilaterally.  Pain started about 2 weeks ago.  Has quit walking because her heels are hurting.  Nome low back pain as well. Has happened before and went to a chiropracter and it helped. Doing her yoga does make it feel better.  Heel pain is worse with walking.  Feels like a bruise on the back of her heel. Was walking up to 3 miles a day. Taking Tyenol and IBU for pain relief. Also has tried ice packs and heat.    Current Medications (verified): 1)  Paroxetine Hcl 20 Mg Tabs (Paroxetine Hcl) .... One and 1/2 By Mouth Daily 2)  Multivitamins  Tabs (Multiple Vitamin) .... Take 1 Tablet By Mouth Once A Day 3)  Caltrate 600+d 600-400 Mg-Unit Tabs (Calcium Carbonate-Vitamin D) .... One By Mouth Two Times A Day 4)  Flonase 50 Mcg/act  Susp (Fluticasone Propionate) .... 2 Sprays Per Nostril Daily 5)  Fish Oil 1000 Mg Caps (Omega-3 Fatty Acids) .... Take One Tablet By Mouth Once A Day 6)  Glucosamine-Chondroitin  Caps (Glucosamine-Chondroit-Vit C-Mn) .... Take One Capsule By Mouth Once A Day  Allergies (verified): 1)  ! Codeine  Comments:  Nurse/Medical Assistant: The patient's medications and allergies were reviewed with the patient and were updated in the Medication and Allergy Lists. Kathlene November (December 11, 2009 2:31 PM)  Physical Exam  General:  Well-developed,well-nourished,in no acute distress; alert,appropriate and cooperative throughout  examination Msk:  Normal flexion and extension. Tender over the lumbar spine and the right SI joint.  Hip, knee and ankle strength  5/5.  Ankle with NROM.  Nontender along hte achilles and normal calf squeeze. She is really tender beside the achilles near where attaches to the heel medially and laterally on both ankles.  Pulses:  DP and post tib 2+ bilat.  Extremities:  No LE edema.  Neurologic:  Patellar and achilles reflex 2+   Skin:  no rashes.   Psych:  Cognition and judgment appear intact. Alert and cooperative with normal attention span and concentration. No apparent delusions, illusions, hallucinations   Impression & Recommendations:  Problem # 1:  HEEL PAIN, BILATERAL (ICD-729.5) Assessment New Suspect spurs based on exam or possible neuroma.  Will get xray of the worst foot which is the right to see if spur, etc.  If so then will refer to podiatry for possible treatment and injections. In meantime continue to wear supportive shoe wear.  Orders: T-DG Heel Min 2 Views [phone])  Problem # 2:  SCIATICA (ICD-724.3) Assessment: New H.O. given on exercises for scitaitc. Can continue her NSAID, Heat will help as well. Avoid sitting or standing for prolonged periods. Call if not better in 1-2 weeks.  IN meantime call if gets worse. Encouraged her to see a chiropractor since this really seemed to help last time. If not better in  a couple of weeks then refer to PT for further treatment.   Complete Medication List: 1)  Paroxetine Hcl 20 Mg Tabs (Paroxetine hcl) .... One and 1/2 by mouth daily 2)  Multivitamins Tabs (Multiple vitamin) .... Take 1 tablet by mouth once a day 3)  Caltrate 600+d 600-400 Mg-unit Tabs (Calcium carbonate-vitamin d) .... One by mouth two times a day 4)  Flonase 50 Mcg/act Susp (Fluticasone propionate) .... 2 sprays per nostril daily 5)  Fish Oil 1000 Mg Caps (Omega-3 fatty acids) .... Take one tablet by mouth once a day 6)  Glucosamine-chondroitin Caps  (Glucosamine-chondroit-vit c-mn) .... Take one capsule by mouth once a day  Last Flu Vaccine:  Historical (09/11/2008 10:08:33 AM) Flu Vaccine Result Date:  11/25/2009 Flu Vaccine Result:  given Flu Vaccine Next Due:  1 yr Flex Sig Next Due:  Not Indicated Hemoccult Next Due:  Not Indicated

## 2011-01-20 NOTE — Assessment & Plan Note (Signed)
Summary: Sinusitis   Vital Signs:  Patient Profile:   60 Years Old Female Height:     66 inches Weight:      214 pounds Temp:     97.3 degrees F Pulse rate:   75 / minute BP sitting:   119 / 67  (left arm) Cuff size:   regular  Vitals Entered By: Kathlene November (January 03, 2009 1:05 PM)                 PCP:  Nani Gasser MD  Chief Complaint:  H/A x 3 days, teeth hurt, and sinus pressure.  History of Present Illness: Sinus pressure and HA.  Intermittant congestant.  HA for 3 day.  Pressure on theleft side. Raddiating into her teeth.  No nausea. No fever.  No sneezing or ST.  Happenes 2-3 times a year.  Pain is a 6/10. No light sensitivity.     Current Allergies: ! CODEINE      Physical Exam  General:     Well-developed,well-nourished,in no acute distress; alert,appropriate and cooperative throughout examination.  Squinting.  Head:     Normocephalic and atraumatic without obvious abnormalities. No apparent alopecia or balding. Eyes:     No corneal or conjunctival inflammation noted. EOMI. Perrla. Ears:     External ear exam shows no significant lesions or deformities.  Otoscopic examination reveals clear canals, tympanic membranes are intact bilaterally without bulging, retraction, inflammation or discharge. Hearing is grossly normal bilaterally. Nose:     External nasal examination shows no deformity or inflammation. Nasal mucosa are pink and moist without lesions or exudates. Mouth:     Oral mucosa and oropharynx without lesions or exudates.  Teeth in good repair. Neck:     No deformities, masses, or tenderness noted. Lungs:     Normal respiratory effort, chest expands symmetrically. Lungs are clear to auscultation, no crackles or wheezes. Heart:     Normal rate and regular rhythm. S1 and S2 normal without gallop, murmur, click, rub or other extra sounds.    Impression & Recommendations:  Problem # 1:  SINUSITIS- ACUTE-NOS (ICD-461.9)  Her updated  medication list for this problem includes:    Flonase 50 Mcg/act Susp (Fluticasone propionate) .Marland Kitchen... 2 sprays per nostril daily    Amoxicillin 500 Mg Cap (Amoxicillin) .Marland Kitchen... Take 1 capsule by mouth three times a day x 10 days Instructed on treatment. Call if symptoms persist or worsen.   Complete Medication List: 1)  Paroxetine Hcl 20 Mg Tabs (Paroxetine hcl) .... One and 1/2 by mouth daily 2)  Multivitamins Tabs (Multiple vitamin) .... Take 1 tablet by mouth once a day 3)  Caltrate 600+d 600-400 Mg-unit Tabs (Calcium carbonate-vitamin d) .... One by mouth two times a day 4)  Optivar 0.05 % Soln (Azelastine hcl) .Marland Kitchen.. 1 drop in both eyes twice daily 5)  Flonase 50 Mcg/act Susp (Fluticasone propionate) .... 2 sprays per nostril daily 6)  Acyclovir 400 Mg Tabs (Acyclovir) .... Take 1 tablet by mouth three times a day for 5 days. 7)  Patanol 0.1 % Soln (Olopatadine hcl) .Marland Kitchen.. 1 gtt in each eye two times a day 8)  Amoxicillin 500 Mg Cap (Amoxicillin) .... Take 1 capsule by mouth three times a day x 10 days    Prescriptions: AMOXICILLIN 500 MG CAP (AMOXICILLIN) Take 1 capsule by mouth three times a day X 10 days  #30 x 0   Entered and Authorized by:   Nani Gasser MD   Signed by:  Nani Gasser MD on 01/03/2009   Method used:   Electronically to        [facility] [phone]* (retail)       [address] [place], [place]  [address]       Ph: [phone] or [phone]       Fax: [phone]   RxID:   [id number]

## 2011-01-20 NOTE — Assessment & Plan Note (Signed)
Summary: UTI, Bilat Leg Pain.    Vital Signs:  Patient Profile:   60 Years Old Female Height:     66 inches Weight:      216.75 pounds Temp:     97.9 degrees F oral Pulse rate:   69 / minute Resp:     18 per minute BP sitting:   110 / 68  (left arm)  Pt. in pain?   yes    Location:   kidney    Intensity:   4    Type:       dull  Vitals Entered By: Harlene Salts (February 16, 2008 4:28 PM)                  PCP:  Nani Gasser MD  Chief Complaint:  pain in kidney .  History of Present Illness: Feels like not emptying bladder well for 3-4 days  .Then woke up with pain in right flank last night.  Put a hot pack on it and it felt better.  Then later today started day.  No dysuria.  Feels like bladder is full. no fever. No hematuria.  No known hx of stones.   Thinks she has PAD.  Legs ache in shins with walking.  Feels her muscles cramping.  Father had PAD.  Feels better when gets in hot bath.  Massage is better.  Not relieved with rest.  No varicose veins.  Does yoga.     Current Allergies: ! CODEINE   Family History:    Father - Alcoholism, depression, PAD    Grandmother - Brain Ca, Mother - HTN,     Uncle-stroke    Father died from blood clot from thrombosis of the leg.      GF died MI age 52.    Social History:    Reviewed history from 09/28/2006 and no changes required:       Lives with her husband, Ardine Bjork, who is in [employer].  They have moved frequently but she is originally from [place].  Quit smoking 1996.  Drinks one alchoholic drink per day. 1 caffeinated drink/day.   Denies drug use.  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  Enjoys yardwork & yoga.     Physical Exam  General:     Well-developed,well-nourished,in no acute distress; alert,appropriate and cooperative throughout examination Head:     Normocephalic and atraumatic without obvious abnormalities. No apparent alopecia or balding. Lungs:   Normal respiratory effort, chest expands symmetrically. Lungs are clear to auscultation, no crackles or wheezes. Heart:     Normal rate and regular rhythm. S1 and S2 normal without gallop, murmur, click, rub or other extra sounds. Extremities:     Legs with normal ROM at hips, knee, and ankles.  No tenderness in calves or upper thighs. Strenght 5/5 in hips, knees, and ankles. No LE edema.  Psych:     Cognition and judgment appear intact. Alert and cooperative with normal attention span and concentration. No apparent delusions, illusions, hallucinations    Impression & Recommendations:  Problem # 1:  DYSURIA (ICD-788.1) Will tx for UTI and sent culture.  Orders: Urinalysis-dipstick w/o micro (81003) T-Urine Culture, Bacteria ([address])  Her updated medication list for this problem includes:    Bactrim Ds 800-160 Mg Tabs (Sulfamethoxazole-trimethoprim) .Marland Kitchen... Take one (1) by mouth twice a day  Encouraged to push clear liquids, get enough rest, and take acetaminophen as needed. To be seen in 10  days if no improvement, sooner if worse.   Problem # 2:  LEG PAIN, BILATERAL (ICD-729.5) History not consistant with PAD.  Will rule out other causes of leg pain and cramping. No known back problems.  Pain is moslty over anterior shins.  Orders: T-CBC No Diff ([address]) T-Vitamin B12 (09811-91478) T-Folic Acid, Serum (29562-13086) T-Potassium  (57846-96295)   Complete Medication List: 1)  Paroxetine Hcl 20 Mg Tabs (Paroxetine hcl) .... One and 1/2 by mouth daily 2)  Multivitamins Tabs (Multiple vitamin) .... Take 1 tablet by mouth once a day 3)  Caltrate 600+d 600-400 Mg-unit Tabs (Calcium carbonate-vitamin d) .... One by mouth two times a day 4)  Optivar 0.05 % Soln (Azelastine hcl) .Marland Kitchen.. 1 drop in both eyes twice daily 5)  Veramyst 27.5 Mcg/spray Susp (Fluticasone furoate) .... 2 sprays per nostril daily 6)  Bactrim Ds 800-160 Mg Tabs (Sulfamethoxazole-trimethoprim) .... Take one (1) by  mouth twice a day     Prescriptions: BACTRIM DS 800-160 MG TABS (SULFAMETHOXAZOLE-TRIMETHOPRIM) Take one (1) by mouth twice a day  #6 x 0   Entered and Authorized by:   Nani Gasser MD   Signed by:   Nani Gasser MD on 02/16/2008   Method used:   [facility] ...       [facility]  [address] [id number]       [address] [place], [place]  [address]       Ph: [phone] or [phone]       Fax: [phone]   RxID:   [id number]  ] Laboratory Results   Urine Tests  Date/Time Recieved: February 16, 2008 4:39 PM  Date/Time Reported: February 16, 2008 4:39 PM   Routine Urinalysis   Color: yellow Appearance: Clear Glucose: negative   (Normal Range: Negative) Bilirubin: negative   (Normal Range: Negative) Ketone: negative   (Normal Range: Negative) Spec. Gravity: 1.025   (Normal Range: 1.003-1.035) Blood: trace-intact   (Normal Range: Negative) pH: 7.0   (Normal Range: 5.0-8.0) Protein: negative   (Normal Range: Negative) Urobilinogen: negative   (Normal Range: 0-1) Nitrite: negative   (Normal Range: Negative) Leukocyte Esterace: negative   (Normal Range: Negative)

## 2011-01-20 NOTE — Consult Note (Signed)
Summary: [facility]  [facility]   Imported By: Lanelle Bal 12/27/2009 08:33:05  _____________________________________________________________________  External Attachment:    Type:   Image     Comment:   External Document

## 2011-01-20 NOTE — Letter (Signed)
Summary: Meds & Instructions/[facility]  Meds & Instructions/[facility]   Imported By: Lanelle Bal 02/05/2010 10:19:58  _____________________________________________________________________  External Attachment:    Type:   Image     Comment:   External Document

## 2011-01-20 NOTE — Progress Notes (Signed)
Summary: Wanted lab order  Phone Note Call from Patient Call back at Work Phone [phone]   Caller: Patient Call For: Nani Gasser MD Summary of Call: Scheduled for an appointment today at 1pm but she is feeling better. She wanted her cholesterol checked and was wondering if you can just order the labs and fax it to [facility] without her having to come see you.  Initial call taken by: Jolyne Loa RN,  July 24, 2008 9:44 AM  Follow-up for Phone Call        OK, needs to be fasting.  Follow-up by: Nani Gasser MD,  July 24, 2008 10:19 AM  Additional Follow-up for Phone Call Additional follow up Details #1::        Patient informed. Lab requisition sent to [facility]. Additional Follow-up by: Jolyne Loa RN,  July 24, 2008 10:35 AM  New Problems: OTHER AND UNSPECIFIED HYPERLIPIDEMIA (ICD-272.4)   New Problems: OTHER AND UNSPECIFIED HYPERLIPIDEMIA (ICD-272.4)

## 2011-01-20 NOTE — Progress Notes (Signed)
  Phone Note From Other Clinic   Caller: Dr.McKinley at [facility] Call For: [facility] Summary of Call: Wanted to let you know he was treating pt for pretty bad case of allergic conjunctivitis and that pt is doing well. Just an FYI Initial call taken by: Kathlene November,  October 11, 2008 4:50 PM

## 2011-01-20 NOTE — Progress Notes (Signed)
Summary: Med. Authorization needed  Phone Note Refill Request Message from:  Patient on June 24, 2010 2:11 PM  Refills Requested: Medication #1:  PAROXETINE HCL 20 MG TABS one and 1/2 by mouth daily   Dosage confirmed as above?Dosage Confirmed patient states she needs a authorization for her pharmacy to refill this... [facility] [address] [facility] [place]   Initial call taken by: Michaelle Copas,  June 24, 2010 2:11 PM  Follow-up for Phone Call        was efilled today - KIK Follow-up by: Duard Brady LPN,  June 24, 1609 2:32 PM

## 2011-01-20 NOTE — Letter (Signed)
Summary: Lipid Letter  [facility]  [address], [address]   [place], [place] [address]   Phone: [phone]  Fax: [phone]    07/06/2007  Yvonne Long [address] [place], [place]  [address]  Dear Yvonne Long:  We have carefully reviewed your last lipid profile from 07/05/2007 and the results are noted below with a summary of recommendations for lipid management.    Cholesterol:      208     Goal: <200   HDL "good" Cholesterol:   58     Goal: >40   LDL "bad" Cholesterol:     114     Goal: <160   Triglycerides:      180     Goal: <150    Your thyroid, liver, kidneys, and blood salts are normal.  Your hemoglobin is normal, so you have no signs of anemia or low blood count.  Your lipid goals have not been met; we recommend improving on your TLC diet and Adjunctive Measures (see below) and make the following changes to your regimen.  To lower your triglyerdies I recommend starting a fish oil supplement (100mg  2 tabs two times a day).  WE should recheck your levles in 6 months to make sure they are improved.  Aslo diet and exercise will improve you cholesterol.     TLC Diet (Therapeutic Lifestyle Change): Total Fat should be no greater than 25-35% of total calories Carbohydrates should be 50-60% of total calories Protein should be approximately 15% of total calories Fiber should be at least 20-30 grams a day ***Increased fiber may help lower LDL Total Cholesterol should be < 200mg /day Consider adding plant stanol/sterols to diet (example: Benacol spread) ***A higher intake of unsaturated fat may reduce Triglycerides and Increase HDL    Adjunctive Measures (may lower LIPIDS and reduce risk of Heart Attack) include: Aerobic Exercise (20-30 minutes 3-4 times a week) Limit Alcohol Consumption Weight Reduction Folic Acid 1mg  a day by mouth Dietary Fiber 20-30 grams a day by mouth     Current Medications: 1)    Paroxetine Hcl 20 Mg Tabs (Paroxetine  hcl) .... One and 1/2 by mouth daily 2)    Multivitamins  Tabs (Multiple vitamin) .... Take 1 tablet by mouth once a day 3)    Caltrate 600+d 600-400 Mg-unit Tabs (Calcium carbonate-vitamin d) .... One by mouth two times a day  If you have any questions, please call. We appreciate being able to work with you.   Sincerely,    [facility] Nani Gasser MD

## 2011-01-20 NOTE — Progress Notes (Signed)
  Phone Note Outgoing Call   Action Taken: Phone Call Completed Details for Reason: Follow up call re refill request Summary of Call: Called patient to follow up with her regarding her email requesting refill on medication.  Left message on answering machine for patient to return my call.  Spoke with [facility] and confirmed that refill auth was sent on 06/22/10.   Initial call taken by: Fabienne Bruns,  June 27, 2010 9:38 AM

## 2011-01-20 NOTE — Progress Notes (Signed)
Summary: Lab results  Phone Note Call from Patient   Summary of Call: Pt called requesting lab results. LMOM Informing Pt that we have not yet recieved lab results and as soon as we get them we will call her.  Initial call taken by: Payton Spark CMA,  June 26, 2009 3:09 PM

## 2011-01-20 NOTE — Assessment & Plan Note (Signed)
Summary: Left Otalgia, Watery eye   Vital Signs:  Patient Profile:   60 Years Old Female Height:     66 inches Weight:      212 pounds Temp:     97.9 degrees F oral Pulse rate:   73 / minute BP sitting:   118 / 69  (left arm) Cuff size:   regular  Vitals Entered By: Kathlene November (August 06, 2008 9:35 AM)                 PCP:  Nani Gasser MD  Chief Complaint:  left ear pain, feels full, and hurting behind the ear- hurting since Saturday.Marland Kitchen  History of Present Illness: Pain and puffiness behind the left ear for 2 weeks. no fever. No drainage. No ST, feels a little scratchy.  Left eye waters alot. Has been using Optivar for allergy adn helps some. Has tried changing make-up, etc.  Left side of jaw is aching as well. Did start Claritin -D last night and thought this may have helped some. Did wash her ear with Peroxide and then put olive oil in it and says this soothed it as well but only temporarily. No worsening sxs.      Current Allergies: ! CODEINE      Physical Exam  General:     Well-developed,well-nourished,in no acute distress; alert,appropriate and cooperative throughout examination Head:     Normocephalic and atraumatic without obvious abnormalities. No apparent alopecia or balding. Eyes:     No corneal or conjunctival inflammation noted. EOMI. Perrla.  Ears:     External ear exam shows no significant lesions or deformities.  Otoscopic examination reveals clear canals, tympanic membranes are intact bilaterally without bulging, retraction, inflammation or discharge. Hearing is grossly normal bilaterally. Nose:     External nasal examination shows no deformity or inflammation. Nasal mucosa are pink and moist without lesions or exudates. Mouth:     Oral mucosa and oropharynx without lesions or exudates.  Teeth in good repair. Neck:     No deformities, masses, or tenderness noted. Mildy tender behind the left ear, no LN.  Lungs:     Normal respiratory  effort, chest expands symmetrically. Lungs are clear to auscultation, no crackles or wheezes. Heart:     Normal rate and regular rhythm. S1 and S2 normal without gallop, murmur, click, rub or other extra sounds. Skin:     no rashes.   Cervical Nodes:     No lymphadenopathy noted Psych:     Cognition and judgment appear intact. Alert and cooperative with normal attention span and concentration. No apparent delusions, illusions, hallucinations    Impression & Recommendations:  Problem # 1:  LOM (ICD-382.9) Since sxs greater than 2 weeks will tx. If not better then refer to ENT or CT scan for further eval of pain. Continue the antihistamine and decongestant that she started last night.  Her updated medication list for this problem includes:    Amoxicillin 500 Mg Cap (Amoxicillin) .Marland Kitchen... Take 1 capsule by mouth three times a day x 10 days   Problem # 2:  NASOLACRIMAL DUCT OBSTRUCTION (ICD-375.56) Doesn't sound like contact dermatitis. Discussed warm compresses and massage of the tearduct. If not helping can refer to ophtho.   Complete Medication List: 1)  Paroxetine Hcl 20 Mg Tabs (Paroxetine hcl) .... One and 1/2 by mouth daily 2)  Multivitamins Tabs (Multiple vitamin) .... Take 1 tablet by mouth once a day 3)  Caltrate 600+d 600-400 Mg-unit Tabs (Calcium carbonate-vitamin  d) .... One by mouth two times a day 4)  Optivar 0.05 % Soln (Azelastine hcl) .Marland Kitchen.. 1 drop in both eyes twice daily 5)  Flonase 50 Mcg/act Susp (Fluticasone propionate) .... 2 sprays per nostril daily 6)  Acyclovir 400 Mg Tabs (Acyclovir) .... Take 1 tablet by mouth three times a day for 5 days. 7)  Amoxicillin 500 Mg Cap (Amoxicillin) .... Take 1 capsule by mouth three times a day x 10 days   Complete Medication List: 1)  Paroxetine Hcl 20 Mg Tabs (Paroxetine hcl) .... One and 1/2 by mouth daily 2)  Multivitamins Tabs (Multiple vitamin) .... Take 1 tablet by mouth once a day 3)  Caltrate 600+d 600-400 Mg-unit Tabs  (Calcium carbonate-vitamin d) .... One by mouth two times a day 4)  Optivar 0.05 % Soln (Azelastine hcl) .Marland Kitchen.. 1 drop in both eyes twice daily 5)  Flonase 50 Mcg/act Susp (Fluticasone propionate) .... 2 sprays per nostril daily 6)  Acyclovir 400 Mg Tabs (Acyclovir) .... Take 1 tablet by mouth three times a day for 5 days. 7)  Amoxicillin 500 Mg Cap (Amoxicillin) .... Take 1 capsule by mouth three times a day x 10 days    Prescriptions: AMOXICILLIN 500 MG CAP (AMOXICILLIN) Take 1 capsule by mouth three times a day X 10 days  #30 x 0   Entered and Authorized by:   Nani Gasser MD   Signed by:   Nani Gasser MD on 08/06/2008   Method used:   [facility] ...       [facility]  [address]       [address] [place], [place]  [address]       Ph: [phone] or [phone]       Fax: [phone]   RxID:   [id number]  ]

## 2011-01-20 NOTE — Assessment & Plan Note (Signed)
Summary: Sinusitis   Vital Signs:  Patient Profile:   60 Years Old Female Height:     66 inches Weight:      216 pounds Temp:     97.2 degrees F oral Pulse rate:   64 / minute BP sitting:   124 / 71  (left arm) Cuff size:   regular  Vitals Entered By: Kathlene November (March 08, 2008 1:34 PM)                 PCP:  Nani Gasser MD  Chief Complaint:  H/A, ears hurt, head congestion, sweat and chills at night, dry cough x 1 week, and URI symptoms.  History of Present Illness:  URI Symptoms      This is a 60 year old woman who presents with URI symptoms.  The symptoms began 10 days ago.  The patient reports nasal congestion, dry cough, earache, and sick contacts, but denies sore throat.  The patient denies fever, dyspnea, and wheezing.  The patient also reports headache.  The patient denies the following risk factors for Strep sinusitis: unilateral facial pain.  Using nasal saline and it does seem to help soe of the rawness in her nose.     Current Allergies: ! CODEINE      Physical Exam  General:     Well-developed,well-nourished,in no acute distress; alert,appropriate and cooperative throughout examination Head:     Normocephalic and atraumatic without obvious abnormalities. No apparent alopecia or balding. Eyes:     No corneal or conjunctival inflammation noted. EOMI. Perrla.  Ears:     External ear exam shows no significant lesions or deformities.  Otoscopic examination reveals clear canals, tympanic membranes are intact bilaterally without bulging, retraction, inflammation or discharge. Hearing is grossly normal bilaterally. Nose:     External nasal examination shows no deformity or inflammation. Nasal mucosa are pink and moist without lesions or exudates. Mouth:     Oral mucosa and oropharynx without lesions or exudates.  Teeth in good repair. Neck:     No deformities, masses, or tenderness noted. Lungs:     Normal respiratory effort, chest expands  symmetrically. Lungs are clear to auscultation, no crackles or wheezes. Heart:     Normal rate and regular rhythm. S1 and S2 normal without gallop, murmur, click, rub or other extra sounds. Skin:     no rashes.   Cervical Nodes:     no anterior cervical adenopathy.   Psych:     Cognition and judgment appear intact. Alert and cooperative with normal attention span and concentration. No apparent delusions, illusions, hallucinations    Impression & Recommendations:  Problem # 1:  SINUSITIS (ICD-473.9)  Her updated medication list for this problem includes:    Veramyst 27.5 Mcg/spray Susp (Fluticasone furoate) .Marland Kitchen... 2 sprays per nostril daily    Amoxicillin 500 Mg Cap (Amoxicillin) .Marland Kitchen... Take 1 capsule by mouth three times a day x 10 days Take antibiotics for full duration. Discussed treatment options including indications for coronal CT scan of sinuses and ENT referral.   Complete Medication List: 1)  Paroxetine Hcl 20 Mg Tabs (Paroxetine hcl) .... One and 1/2 by mouth daily 2)  Multivitamins Tabs (Multiple vitamin) .... Take 1 tablet by mouth once a day 3)  Caltrate 600+d 600-400 Mg-unit Tabs (Calcium carbonate-vitamin d) .... One by mouth two times a day 4)  Optivar 0.05 % Soln (Azelastine hcl) .Marland Kitchen.. 1 drop in both eyes twice daily 5)  Veramyst 27.5 Mcg/spray Susp (Fluticasone furoate) .Marland KitchenMarland KitchenMarland Kitchen  2 sprays per nostril daily 6)  Acyclovir 400 Mg Tabs (Acyclovir) .... Take 1 tablet by mouth three times a day for 5 days. 7)  Amoxicillin 500 Mg Cap (Amoxicillin) .... Take 1 capsule by mouth three times a day x 10 days     Prescriptions: AMOXICILLIN 500 MG CAP (AMOXICILLIN) Take 1 capsule by mouth three times a day X 10 days  #30 x 0   Entered and Authorized by:   Nani Gasser MD   Signed by:   Nani Gasser MD on 03/08/2008   Method used:   [facility] ...       [facility]  [address]       [address] [place], [place]  [address]       Ph: [phone] or  [phone]       Fax: [phone]   RxID:   [id number]  ]

## 2011-01-20 NOTE — Assessment & Plan Note (Signed)
Summary: sinusitis   Vital Signs:  Patient Profile:   60 Years Old Female Height:     66 inches Weight:      214 pounds BMI:     34.67 O2 Sat:      97 % Temp:     97.7 degrees F oral Pulse rate:   75 / minute BP sitting:   132 / 65  (left arm) Cuff size:   regular  Vitals Entered By: Harlene Salts (May 09, 2008 1:32 PM)                 PCP:  Nani Gasser MD  Chief Complaint:  c/o left ear, face, and neck pain times ten days.  History of Present Illness: 60 yo WF presents 10 days of URI symptoms with head congestion and cough.  Started having increased L sided throat pain, pain behind L eye, L ear.  Hx of sinus infections.  It has been about 2 mos since last one (treated with Amoxicillin and got better).  Denies fever or chills.  Feels tired.  Cough has resolved.  Taking Tylenol and Sudafed which helps some.  Using Salt Water gargles and use sweet oil in ear.    Current Allergies: ! CODEINE  Past Medical History:    Reviewed history from 12/29/2006 and no changes required:       G3 P2012        Hx Diverticulum   Social History:    Reviewed history from 09/28/2006 and no changes required:       Lives with her husband, Ardine Bjork, who is in [employer].  They have moved frequently but she is originally from [place].  Quit smoking 1996.  Drinks one alchoholic drink per day. 1 caffeinated drink/day.   Denies drug use.  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  Enjoys yardwork & yoga.    Review of Systems      See HPI   Physical Exam  General:     alert, well-developed, well-nourished, and well-hydrated.   Head:     normocephalic and atraumatic.  L maxillary sinus is very TTP Eyes:     eyes lightly watery bilat, conjunctiva clear, EOMI Ears:     clear effusion both ears, no redness, good bony landmarks Nose:     clear/ yelllow nasal congestion Mouth:     good dentition and pharynx pink and moist.  throat  injected.  no exudates or vesicles. Neck:     supple and no masses.   Lungs:     Normal respiratory effort, chest expands symmetrically. Lungs are clear to auscultation, no crackles or wheezes. Heart:     Normal rate and regular rhythm. S1 and S2 normal without gallop, murmur, click, rub or other extra sounds. Skin:     color normal and no rashes.   Cervical Nodes:     L>R anterior cervical chain LA with tenderness    Impression & Recommendations:  Problem # 1:  SINUSITIS (ICD-473.9) Treat with 10 days of Augmentin in addition to supportive care and Flonase for prevention of recurrent sinus infections.  OK to use Tylenol, Mucinex and Sudafed in additon.  Call if not improved in 10 days. Her updated medication list for this problem includes:    Flonase 50 Mcg/act Susp (Fluticasone propionate) .Marland Kitchen... 2 sprays per nostril daily    Augmentin 875-125 Mg Tabs (Amoxicillin-pot clavulanate) .Marland Kitchen... 1 tab by mouth q 12 hrs x 10  days   Complete Medication List: 1)  Paroxetine Hcl 20 Mg Tabs (Paroxetine hcl) .... One and 1/2 by mouth daily 2)  Multivitamins Tabs (Multiple vitamin) .... Take 1 tablet by mouth once a day 3)  Caltrate 600+d 600-400 Mg-unit Tabs (Calcium carbonate-vitamin d) .... One by mouth two times a day 4)  Optivar 0.05 % Soln (Azelastine hcl) .Marland Kitchen.. 1 drop in both eyes twice daily 5)  Flonase 50 Mcg/act Susp (Fluticasone propionate) .... 2 sprays per nostril daily 6)  Acyclovir 400 Mg Tabs (Acyclovir) .... Take 1 tablet by mouth three times a day for 5 days. 7)  Augmentin 875-125 Mg Tabs (Amoxicillin-pot clavulanate) .Marland Kitchen.. 1 tab by mouth q 12 hrs x 10 days   Patient Instructions: 1)  Take 10 days of Augmentin for sinus infection. 2)  OK to use Tylenol or Advil for sore throat or sinus pain. 3)  Plenty of clear liquids, rest. 4)  Use Mucinex OTC as needed with Sudafed. 5)  Rx for Flonase to use for maintenance given.     Prescriptions: AUGMENTIN 875-125 MG  TABS  (AMOXICILLIN-POT CLAVULANATE) 1 tab by mouth q 12 hrs x 10 days  #20 x 0   Entered and Authorized by:   Seymour Bars DO   Signed by:   Seymour Bars DO on 05/09/2008   Method used:   [facility] ...       [facility]  [address] [phone][facility]       [address] [place], [place]  [address]       Ph: [phone] or [phone]       Fax: [phone]   RxID:   [id number] FLONASE 50 MCG/ACT  SUSP (FLUTICASONE PROPIONATE) 2 sprays per nostril daily  #1 bottle x 3   Entered and Authorized by:   Seymour Bars DO   Signed by:   Seymour Bars DO on 05/09/2008   Method used:   [facility] to ...       [facility]  [address]       [address] [place], [place]  [address]       Ph: [phone] or [phone]       Fax: [phone]   RxID:   [id number]  ]

## 2011-01-20 NOTE — Assessment & Plan Note (Signed)
Summary: Sinusitis, Vaginitis   Vital Signs:  Patient profile:   60 year old female Height:      66 inches Weight:      214 pounds BMI:     34.67 O2 Sat:      96 % on Room air Temp:     99.0 degrees F oral Pulse rate:   73 / minute BP sitting:   131 / 69  (left arm) Cuff size:   large  Vitals Entered By: Kathlene November (June 25, 2009 11:05 AM)  O2 Flow:  Room air CC: sinus congestion, teeth hurt, H/A, right earache. Also thinks she has a yeast infection Is Patient Diabetic? No   Primary Care Provider:  Nani Gasser MD  CC:  sinus congestion, teeth hurt, H/A, and right earache. Also thinks she has a yeast infection.  History of Present Illness: Sinus congestion, teeth hurt on the left, H/A, left earache. Fever to 101.  Has had sxs about 4 days ago. + sick contacts.  No N/V?D. Some loose stools.  Typically gets sinusitis on the left.  Has been using her nasal steroid spray and nasal saline spray.   Also thinks she has a yeast infection.  Vaginal and suprapubic pain. No itching or chane in discharge. Syas feel like a typical yeast infection. Has tried monistat and helped some but hasn't completely cleared up. Started about 3 days ago.  Denies any urinary sxs.   Current Medications (verified): 1)  Paroxetine Hcl 20 Mg Tabs (Paroxetine Hcl) .... One and 1/2 By Mouth Daily 2)  Multivitamins  Tabs (Multiple Vitamin) .... Take 1 Tablet By Mouth Once A Day 3)  Caltrate 600+d 600-400 Mg-Unit Tabs (Calcium Carbonate-Vitamin D) .... One By Mouth Two Times A Day 4)  Optivar 0.05 %  Soln (Azelastine Hcl) .Marland Kitchen.. 1 Drop in Both Eyes Twice Daily 5)  Flonase 50 Mcg/act  Susp (Fluticasone Propionate) .... 2 Sprays Per Nostril Daily 6)  Acyclovir 400 Mg  Tabs (Acyclovir) .... Take 1 Tablet By Mouth Three Times A Day For 5 Days. 7)  Patanol 0.1 % Soln (Olopatadine Hcl) .Marland Kitchen.. 1 Gtt in Each Eye Two Times A Day 8)  Veramyst 27.5 Mcg/spray Susp (Fluticasone Furoate) .... Two Sprays/nostrils Daily 9)   Levaquin 500 Mg Tabs (Levofloxacin) .... Take 1 Tablet By Mouth Once A Day For 10 Days 10)  Fluconazole 150 Mg Tabs (Fluconazole) .... Take 1 Tablet By Mouth Once A Day X 1  Allergies (verified): 1)  ! Codeine  Comments:  Nurse/Medical Assistant: The patient's medications and allergies were reviewed with the patient and were updated in the Medication and Allergy Lists. Kathlene November (June 25, 2009 11:06 AM)  Physical Exam  General:  Well-developed,well-nourished,in no acute distress; alert,appropriate and cooperative throughout examination Head:  Normocephalic and atraumatic without obvious abnormalities. No apparent alopecia or balding. Eyes:  No corneal or conjunctival inflammation noted. EOMI. Perrla. Ears:  External ear exam shows no significant lesions or deformities.  Otoscopic examination reveals clear canals, tympanic membranes are intact bilaterally without bulging, retraction, inflammation or discharge. Hearing is grossly normal bilaterally. Nose:  External nasal examination shows no deformity or inflammation. Nasal mucosa are pink and moist without lesions or exudates. Mouth:  Oral mucosa and oropharynx without lesions or exudates.  Teeth in good repair. Neck:  No deformities, masses, or tenderness noted. Chest Wall:  No deformities, masses, or tenderness noted. Lungs:  Normal respiratory effort, chest expands symmetrically. Lungs are clear to auscultation, no crackles  or wheezes. Heart:  Normal rate and regular rhythm. S1 and S2 normal without gallop, murmur, click, rub or other extra sounds. Skin:  no rashes.   Cervical Nodes:  No lymphadenopathy noted Psych:  Cognition and judgment appear intact. Alert and cooperative with normal attention span and concentration. No apparent delusions, illusions, hallucinations   Impression & Recommendations:  Problem # 1:  SINUSITIS- ACUTE-NOS (ICD-461.9)  The following medications were removed from the medication list:    Veramyst 27.5  Mcg/spray Susp (Fluticasone furoate) .Marland Kitchen..Marland Kitchen Two sprays/nostrils daily    Levaquin 500 Mg Tabs (Levofloxacin) .Marland Kitchen... Take 1 tablet by mouth once a day for 10 days Her updated medication list for this problem includes:    Flonase 50 Mcg/act Susp (Fluticasone propionate) .Marland Kitchen... 2 sprays per nostril daily    Zithromax Z-pak 250 Mg Tabs (Azithromycin) .Marland Kitchen... Take  as directed.  Instructed on treatment. Call if symptoms persist or worsen.   Problem # 2:  VAGINITIS (ICD-616.10) UA + for blood. Will send a culture. In meantime will treat for yeast infection. Will send wet prep. Will call with results as soon as gets back.  The following medications were removed from the medication list:    Levaquin 500 Mg Tabs (Levofloxacin) .Marland Kitchen... Take 1 tablet by mouth once a day for 10 days Her updated medication list for this problem includes:    Zithromax Z-pak 250 Mg Tabs (Azithromycin) .Marland Kitchen... Take  as directed.  Complete Medication List: 1)  Paroxetine Hcl 20 Mg Tabs (Paroxetine hcl) .... One and 1/2 by mouth daily 2)  Multivitamins Tabs (Multiple vitamin) .... Take 1 tablet by mouth once a day 3)  Caltrate 600+d 600-400 Mg-unit Tabs (Calcium carbonate-vitamin d) .... One by mouth two times a day 4)  Optivar 0.05 % Soln (Azelastine hcl) .Marland Kitchen.. 1 drop in both eyes twice daily 5)  Flonase 50 Mcg/act Susp (Fluticasone propionate) .... 2 sprays per nostril daily 6)  Acyclovir 400 Mg Tabs (Acyclovir) .... Take 1 tablet by mouth three times a day for 5 days. 7)  Patanol 0.1 % Soln (Olopatadine hcl) .Marland Kitchen.. 1 gtt in each eye two times a day 8)  Fluconazole 150 Mg Tabs (Fluconazole) .... Take 1 tablet by mouth once a day x 1 9)  Zithromax Z-pak 250 Mg Tabs (Azithromycin) .... Take  as directed.  Other Orders: UA Dipstick w/o Micro (automated)  (81003) T-Wet Prep for Christoper Allegra, Clue Cells ([id number]) T-Urine Culture (Spectrum Order) [id number]) Prescriptions: FLUCONAZOLE 150 MG TABS (FLUCONAZOLE) Take 1 tablet by  mouth once a day x 1  #1 x 0   Entered and Authorized by:   Nani Gasser MD   Signed by:   Nani Gasser MD on 06/25/2009   Method used:   [facility] to        [facility] [phone]* (retail)       [address] [place], [place]  [address]       Ph: [phone] or [phone]       Fax: [phone]   RxID:   [id number] PAROXETINE HCL 20 MG TABS (PAROXETINE HCL) one and 1/2 by mouth daily  #45 Tablet x 3   Entered and Authorized by:   Nani Gasser MD   Signed by:   Nani Gasser MD on 06/25/2009   Method used:   [facility]ically to        [facility] [phone]* (retail)       [phone]  [address] [place]  [address]       Ph: [phone] or [phone]       Fax: [phone]   RxID:   [id number] ZITHROMAX Z-PAK 250 MG TABS (AZITHROMYCIN) Take  as directed.  #1 x 0   Entered and Authorized by:   Nani Gasser MD   Signed by:   Nani Gasser MD on 06/25/2009   Method used:   Electronically to        [facility] [phone]* (retail)       [address] [place], [place]  [address]       Ph: [phone] or [phone]       Fax: [phone]   RxID:   [id number]

## 2011-01-20 NOTE — Progress Notes (Signed)
Summary: NURSE CALL RE EYES  Phone Note Call from Patient Call back at Work Phone [phone]   Caller: Patient Call For: Nani Gasser MD Reason for Call: Talk to Nurse Summary of Call: PT LEFT MSG ON VM FOR A RETURN CALL RE HER EYES. Initial call taken by: Sarina Ill,  October 01, 2008 1:09 PM  Follow-up for Phone Call        Spoke with pt and she says her eyes are some better after the drops but still watery, itchy, and feels like something in them. [facility] [address] [facility] [place]. If needs to see Opthamology would like to see Dr. Verdis Prime in [place] Follow-up by: Kathlene November,  October 01, 2008 1:16 PM  Additional Follow-up for Phone Call Additional follow up Details #1::        Refer ophtho Additional Follow-up by: Nani Gasser MD,  October 01, 2008 1:24 PM    Additional Follow-up for Phone Call Additional follow up Details #2::    Will refer to Optho Follow-up by: Kathlene November,  October 01, 2008 1:37 PM

## 2011-01-20 NOTE — Assessment & Plan Note (Signed)
Summary: sinusitis   Vital Signs:  Patient Profile:   60 Years Old Female Height:     66 inches Weight:      215 pounds Temp:     97.1 degrees F oral Pulse rate:   76 / minute BP sitting:   126 / 70  (left arm) Cuff size:   regular  Vitals Entered By: Kathlene November (February 04, 2009 1:55 PM)                 PCP:  Nani Gasser MD  Chief Complaint:  sinus pressure, nausea, off balance, left earache, denies cough, and feels"lousy".  History of Present Illness: sinus pressure, nausea, off balance, left earache, denies cough, feels"lousy".  Sxs for 8 days.  Never really went away. Got some better with amoxicilin in mid January for sinusitis,  but sxs  never really went away.  No fever.  [place] OTC decongestants, Claritin-D, salt water.     Current Allergies: ! CODEINE      Physical Exam  General:     Well-developed,well-nourished,in no acute distress; alert,appropriate and cooperative throughout examination Head:     Normocephalic and atraumatic without obvious abnormalities. No apparent alopecia or balding. Eyes:     No corneal or conjunctival inflammation noted. EOMI. Perrla.  Ears:     External ear exam shows no significant lesions or deformities.  Otoscopic examination reveals clear canals, tympanic membranes are intact bilaterally without bulging, retraction, inflammation or discharge. Hearing is grossly normal bilaterally. Nose:     External nasal examination shows no deformity or inflammation. Mouth:     Oral mucosa and oropharynx without lesions or exudates.  Teeth in good repair. Neck:     No deformities, masses, or tenderness noted. Lungs:     Normal respiratory effort, chest expands symmetrically. Lungs are clear to auscultation, no crackles or wheezes. Heart:     Normal rate and regular rhythm. S1 and S2 normal without gallop, murmur, click, rub or other extra sounds. Skin:     no rashes.   Cervical Nodes:     No lymphadenopathy noted Psych:    Cognition and judgment appear intact. Alert and cooperative with normal attention span and concentration. No apparent delusions, illusions, hallucinations    Impression & Recommendations:  Problem # 1:  SINUSITIS- ACUTE-NOS (ICD-461.9)  Her updated medication list for this problem includes:    Flonase 50 Mcg/act Susp (Fluticasone propionate) .Marland Kitchen... 2 sprays per nostril daily    Veramyst 27.5 Mcg/spray Susp (Fluticasone furoate) .Marland Kitchen..Marland Kitchen Two sprays/nostrils daily    Levaquin 500 Mg Tabs (Levofloxacin) .Marland Kitchen... Take 1 tablet by mouth once a day for 10 days Instructed on treatment. Call if symptoms persist or worsen.   Complete Medication List: 1)  Paroxetine Hcl 20 Mg Tabs (Paroxetine hcl) .... One and 1/2 by mouth daily 2)  Multivitamins Tabs (Multiple vitamin) .... Take 1 tablet by mouth once a day 3)  Caltrate 600+d 600-400 Mg-unit Tabs (Calcium carbonate-vitamin d) .... One by mouth two times a day 4)  Optivar 0.05 % Soln (Azelastine hcl) .Marland Kitchen.. 1 drop in both eyes twice daily 5)  Flonase 50 Mcg/act Susp (Fluticasone propionate) .... 2 sprays per nostril daily 6)  Acyclovir 400 Mg Tabs (Acyclovir) .... Take 1 tablet by mouth three times a day for 5 days. 7)  Patanol 0.1 % Soln (Olopatadine hcl) .Marland Kitchen.. 1 gtt in each eye two times a day 8)  Veramyst 27.5 Mcg/spray Susp (Fluticasone furoate) .... Two sprays/nostrils daily 9)  Levaquin  500 Mg Tabs (Levofloxacin) .... Take 1 tablet by mouth once a day for 10 days    Prescriptions: LEVAQUIN 500 MG TABS (LEVOFLOXACIN) Take 1 tablet by mouth once a day for 10 days  #10 x 0   Entered and Authorized by:   Nani Gasser MD   Signed by:   Nani Gasser MD on 02/04/2009   Method used:   Electronically to        [facility] [phone]* (retail)       [address] [place], [place]  [address]       Ph: [phone] or [phone]       Fax: [phone]   RxID:   [id number]

## 2011-01-20 NOTE — Assessment & Plan Note (Signed)
Summary: PALPATATIONS   Vital Signs:  Patient Profile:   60 Years Old Female Height:     66 inches Weight:      215 pounds Pulse rate:   58 / minute BP sitting:   122 / 70  (left arm) Cuff size:   regular  Vitals Entered By: Kathlene November (July 05, 2007 9:24 AM)               PCP:  Cipriano Bunker  Chief Complaint:  having heart palpitations for a few months. Also needs cholesterol checked.  History of Present Illness: 2 months of heart palps.  Last 30 seconds.  Happens every other day.  Feels like heart "skips a beat".  Usually notices at night before bedtime (when relaxing).  Occ SOB. No chilks, sweats, or cough.  Some sternal CP and Back pain.  CP is described at sharp muscle cramping.  Had similar feeling when on Vioxx several years ago.  Stressed on some family members (mother ill).  Works from home.  Overall though feels her works stress has decreased (since starting to work form home).  Grandparents with heart dz.  10-15 pound weight gain in the last 6 months.  Last cholesterol check in 2005.   Current Allergies: ! CODEINE   Family History:    Father - Alcoholism, depression     Grandmother - Brain Ca, Mother - HTN,     Uncle-stroke    Father died from blood clot?  Social History:    Reviewed history from 09/28/2006 and no changes required:       Lives with her husband, Ardine Bjork, who is in [employer].  They have moved frequently but she is originally from [place].  Quit smoking 1996.  Drinks one alchoholic drink per day. 1 caffeinated drink/day.   Denies drug use.  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  Enjoys yardwork & yoga.     Physical Exam  General:     Well-developed,well-nourished,in no acute distress; alert,appropriate and cooperative throughout examination Head:     Normocephalic and atraumatic without obvious abnormalities. No apparent alopecia or balding. Eyes:     No corneal or conjunctival inflammation  noted. EOMI. Perrla.  Neck:     No deformities, masses, or tenderness noted.No TM Chest Wall:     No deformities, masses, or tenderness noted. Lungs:     Normal respiratory effort, chest expands symmetrically. Lungs are clear to auscultation, no crackles or wheezes. Heart:     Normal rate and regular rhythm. S1 and S2 normal without gallop, murmur, click, rub or other extra sounds sitting or lying.  Abdomen:     soft and normal bowel sounds.   Pulses:     Radial 2+ Extremities:     No LE edema    Impression & Recommendations:  Problem # 1:  PALPITATIONS (ICD-785.1) Unclear etiology.  Most likely PVCs.  EKg is normal excpet for bradycardia and inverted p wave in Lead III.  NO ST-T waves changes. Will get screening labs to rule out anemia and endocrine causes. If all normal consider setting up for event monitor from [facility] here in [place].   Orders: T-Comprehensive Metabolic Panel [id number]) T-Lipid Profile [id number] T-TSH [id number]) T-CBC No Diff ([id number]) EKG w/ Interpretation (93000)   Other Orders: T-Mammography, Diagnostic (bilateral) ([id number])

## 2011-01-21 ENCOUNTER — Telehealth: Payer: Self-pay | Admitting: Family Medicine

## 2011-01-22 NOTE — Assessment & Plan Note (Signed)
Summary: Sinusitis, vertigo   Vital Signs:  Patient profile:   60 year old female Height:      66 inches Weight:      216 pounds Pulse rate:   91 / minute BP sitting:   113 / 67  (right arm) Cuff size:   large  Vitals Entered By: Avon Gully CMA, (AAMA) (December 23, 2010 3:47 PM) CC: HA,dizzines,sinus pressure x several days   Primary Care Provider:  Nani Gasser MD  CC:  HA, dizzines, and sinus pressure x several days.  History of Present Illness: HA,dizzines,sinus pressure x several days. Sore in her nose. Sx sfor 3-4 days. No fever. Feels like her eyes hurt and losing her balanc.e Her ears feel full but not painful.  PUtting heat behind her ears and it feels good.  No Gi S.E. Using nasal saline. Fronatl HA is intense.  OTC pain relievers.    Current Medications (verified): 1)  Paroxetine Hcl 20 Mg Tabs (Paroxetine Hcl) .... One and 1/2 By Mouth Daily 2)  Multivitamins  Tabs (Multiple Vitamin) .... Take 1 Tablet By Mouth Once A Day 3)  Caltrate 600+d 600-400 Mg-Unit Tabs (Calcium Carbonate-Vitamin D) .... One By Mouth Two Times A Day 4)  Fish Oil 1000 Mg Caps (Omega-3 Fatty Acids) .... Take One Tablet By Mouth Once A Day 5)  Glucosamine-Chondroitin  Caps (Glucosamine-Chondroit-Vit C-Mn) .... Take One Capsule By Mouth Once A Day 6)  Estrace 1 Mg Tabs (Estradiol) .... Take 1 Tablet By Mouth Once A Day  Allergies (verified): 1)  ! Codeine  Comments:  Nurse/Medical Assistant: The patient's medications and allergies were reviewed with the patient and were updated in the Medication and Allergy Lists. Avon Gully CMA, Duncan Dull) (December 23, 2010 3:48 PM)  Physical Exam  General:  Well-developed,well-nourished,in no acute distress; alert,appropriate and cooperative throughout examination Head:  Normocephalic and atraumatic without obvious abnormalities. No apparent alopecia or balding. Eyes:  No corneal or conjunctival inflammation noted. EOMI. Perrla.  Ears:   External ear exam shows no significant lesions or deformities.  Otoscopic examination reveals clear canals, tympanic membranes are intact bilaterally without bulging, retraction, inflammation or discharge. Hearing is grossly normal bilaterally. Nose:  External nasal examination shows no deformity or inflammation. Nasal mucosa are pink and moist without lesions or exudates. Mouth:  Oral mucosa and oropharynx without lesions or exudates.  Teeth in good repair. Neck:  No deformities, masses, or tenderness noted. Lungs:  Normal respiratory effort, chest expands symmetrically. Lungs are clear to auscultation, no crackles or wheezes. Heart:  Normal rate and regular rhythm. S1 and S2 normal without gallop, murmur, click, rub or other extra sounds. Skin:  no rashes.   Cervical Nodes:  No lymphadenopathy noted Psych:  Cognition and judgment appear intact. Alert and cooperative with normal attention span and concentration. No apparent delusions, illusions, hallucinations   Impression & Recommendations:  Problem # 1:  SINUSITIS - ACUTE-NOS (ICD-461.9) I suspect her vertigo is related to her ears. Consider viral vs bacterial. Explained the difference to pt. Will go ahead and treat. I fnotr better in one week need to f/u in the office or if sinuese resolved but vertigo is still present needs to f/u.  The following medications were removed from the medication list:    Flonase 50 Mcg/act Susp (Fluticasone propionate) .Marland Kitchen... 2 sprays per nostril daily    Bactrim Ds 800-160 Mg Tabs (Sulfamethoxazole-trimethoprim) .Marland Kitchen... Take 1 tablet by mouth two times a day for 3 days. Her updated medication  list for this problem includes:    Amoxicillin 875 Mg Tabs (Amoxicillin) .Marland Kitchen... Take 1 tablet by mouth two times a day for 10 days  Complete Medication List: 1)  Paroxetine Hcl 20 Mg Tabs (Paroxetine hcl) .... One and 1/2 by mouth daily 2)  Multivitamins Tabs (Multiple vitamin) .... Take 1 tablet by mouth once a day 3)   Caltrate 600+d 600-400 Mg-unit Tabs (Calcium carbonate-vitamin d) .... One by mouth two times a day 4)  Fish Oil 1000 Mg Caps (Omega-3 fatty acids) .... Take one tablet by mouth once a day 5)  Glucosamine-chondroitin Caps (Glucosamine-chondroit-vit c-mn) .... Take one capsule by mouth once a day 6)  Estrace 1 Mg Tabs (Estradiol) .... Take 1 tablet by mouth once a day 7)  Amoxicillin 875 Mg Tabs (Amoxicillin) .... Take 1 tablet by mouth two times a day for 10 days  Patient Instructions: 1)  Call if not better in one week.  Prescriptions: AMOXICILLIN 875 MG TABS (AMOXICILLIN) Take 1 tablet by mouth two times a day for 10 days  #20 x 0   Entered and Authorized by:   Nani Gasser MD   Signed by:   Nani Gasser MD on 12/23/2010   Method used:   [facility]ically to        [facility] [phone][facility] (retail)       [address] [place], [place]  [address]       Ph: [phone] or [phone]       Fax: [phone]   RxID:   [id number]    Orders Added: 1)  Est. Patient Level III [36644]

## 2011-01-28 NOTE — Progress Notes (Signed)
Summary: [facility]-Diflucan rx  Phone Note Call from Patient Call back at [phone]   Caller: Patient Call For: Nani Gasser MD Summary of Call: pt has yeast infection and would like rx for diflucan sent to pharm.  Review of her chart shows she has received before without OV.  Pt was seen on 1/3 and given 10 day rx for Amoxicillin. Please advise if pt needs OV adn I will call her. Initial call taken by: Francee Piccolo CMA Duncan Dull),  January 21, 2011 2:56 PM  Follow-up for Phone Call        That OK will will send in rx. if no resolution of sxs then needs OV.   Additional Follow-up for Phone Call Additional follow up Details #1::        spoke to pt and notified of above.  pt states she has follow up appt in about two weeks. Additional Follow-up by: Francee Piccolo CMA Duncan Dull),  January 21, 2011 3:32 PM    New/Updated Medications: DIFLUCAN 150 MG TABS (FLUCONAZOLE) Take 1 tablet by mouth once a day x 1 Prescriptions: DIFLUCAN 150 MG TABS (FLUCONAZOLE) Take 1 tablet by mouth once a day x 1  #2 x 0   Entered and Authorized by:   Nani Gasser MD   Signed by:   Nani Gasser MD on 01/21/2011   Method used:   Electronically to        [facility] [phone]* (retail)       [address] [place], [place]  [address]       Ph: [phone] or [phone]       Fax: [phone]   RxID:   [id number]

## 2011-02-04 ENCOUNTER — Telehealth: Payer: Self-pay | Admitting: Family Medicine

## 2011-02-11 NOTE — Progress Notes (Signed)
  Phone Note Refill Request Message from:  Patient on February 04, 2011 11:38 AM  Refills Requested: Medication #1:  ESTRACE 1 MG TABS Take 1 tablet by mouth once a day Initial call taken by: Avon Gully CMA, Duncan Dull),  February 04, 2011 11:39 AM    Prescriptions: ESTRACE 1 MG TABS (ESTRADIOL) Take 1 tablet by mouth once a day  #30 x 2   Entered by:   Avon Gully CMA, (AAMA)   Authorized by:   Nani Gasser MD   Signed by:   Avon Gully CMA, (AAMA) on 02/04/2011   Method used:   Electronically to        [facility] [phone]* (retail)       [address] [place], [place]  [address]       Ph: [phone] or [phone]       Fax: [phone]   RxID:   [id number]

## 2011-04-09 ENCOUNTER — Other Ambulatory Visit: Payer: Self-pay | Admitting: *Deleted

## 2011-04-09 MED ORDER — ESTRADIOL 1 MG PO TABS: 1.0000 mg | ORAL_TABLET | Freq: Every day | ORAL | Status: DC

## 2011-04-16 ENCOUNTER — Other Ambulatory Visit: Payer: Self-pay | Admitting: Family Medicine

## 2011-04-16 ENCOUNTER — Encounter: Payer: Self-pay | Admitting: Family Medicine

## 2011-04-16 ENCOUNTER — Ambulatory Visit (INDEPENDENT_AMBULATORY_CARE_PROVIDER_SITE_OTHER): Payer: Private Health Insurance - Indemnity | Admitting: Family Medicine

## 2011-04-16 ENCOUNTER — Telehealth: Payer: Self-pay | Admitting: *Deleted

## 2011-04-16 DIAGNOSIS — D485 Neoplasm of uncertain behavior of skin: Secondary | ICD-10-CM

## 2011-04-16 DIAGNOSIS — L858 Other specified epidermal thickening: Secondary | ICD-10-CM

## 2011-04-16 MED ORDER — PAROXETINE HCL 20 MG PO TABS: ORAL_TABLET | ORAL | Status: DC

## 2011-04-16 MED ORDER — ESTRADIOL 2 MG PO TABS: 2.0000 mg | ORAL_TABLET | Freq: Every day | ORAL | Status: DC

## 2011-04-16 NOTE — Telephone Encounter (Signed)
Call from pt asking if Dr. Linford Arnold refilled her Estradiol.  Pt is taking 2mg  daily instead of 1mg .  I spoke to Dr. Linford Arnold and this was discussed in her visit, but not the need of a refill.  Per Dr. Linford Arnold OK to refill at 2mg .  RX sent to pharm.  I spoke to pharmacist and he will delete the 1mg  rx from pt profile.  Advised pt to check with pharmacy later for medications.

## 2011-04-16 NOTE — Progress Notes (Signed)
  Subjective:    Patient ID: Yvonne Long, female    DOB: 1951/12/08, 60 y.o.   MRN: [id number]  HPI Ran out of paxil about 3 days ago an has been tearful. Seh would like to restart it.  She didn't even go to work today because she is so teaful. She orginally scheduled appt for tomorrow.     Review of Systems     Objective:   Physical Exam   0.6  X 0.6 cm center raised pink lesion with a white center core.  On the dorsum of the  right hand.    Assessment & Plan:  Skin lesion - suspect Keratocanthoma.  We will call with the bx reults.   Area cleaned with with iodine and alcohol. Area anesthetized with lidocaine 1% with epi.  Shave bx performed with a double edge blade. Aluminum chloride use to achieve hemostasis.  Bandaid with triple abx ointment placed.

## 2011-04-17 ENCOUNTER — Ambulatory Visit: Payer: Self-pay | Admitting: Family Medicine

## 2011-04-20 ENCOUNTER — Telehealth: Payer: Self-pay | Admitting: Family Medicine

## 2011-04-20 DIAGNOSIS — C4492 Squamous cell carcinoma of skin, unspecified: Secondary | ICD-10-CM

## 2011-04-20 NOTE — Telephone Encounter (Signed)
Call pt: Bx shows squamous cll skin cancer. Keratocanthoma like lesion. Will refer to derm for further tx.

## 2011-04-21 NOTE — Telephone Encounter (Signed)
Pt.notified

## 2011-04-30 ENCOUNTER — Other Ambulatory Visit: Payer: Self-pay | Admitting: Family Medicine

## 2011-04-30 DIAGNOSIS — B373 Candidiasis of vulva and vagina: Secondary | ICD-10-CM

## 2011-04-30 DIAGNOSIS — B3731 Acute candidiasis of vulva and vagina: Secondary | ICD-10-CM

## 2011-04-30 MED ORDER — FLUCONAZOLE 100 MG PO TABS: 100.0000 mg | ORAL_TABLET | Freq: Once | ORAL | Status: AC

## 2011-04-30 NOTE — Telephone Encounter (Signed)
OK to send in dfilucan 150mg  x 1. No RF. If sxs not resolved then will need appt for wet prep.

## 2011-04-30 NOTE — Telephone Encounter (Signed)
Notified patient that  Will call Diflucan 150mg  but next occurrence will need wet prep. Jarvis Newcomer, LPN [facility]

## 2011-04-30 NOTE — Telephone Encounter (Signed)
Pt would like diflucan.  Pt complaining of yeast.  Itching, pain and discharge.  Has used Monistat X 4 days without relief.  NKDA.  [facility]  Complete hyst and no recent pap dates.   Last OV 04-16-11.  Please advise. Plan:  Routed to Dr. Marlyne Beards, LPN [facility]

## 2011-06-10 ENCOUNTER — Other Ambulatory Visit: Payer: Self-pay | Admitting: Family Medicine

## 2011-08-10 ENCOUNTER — Other Ambulatory Visit: Payer: Self-pay | Admitting: Family Medicine

## 2011-11-15 ENCOUNTER — Other Ambulatory Visit: Payer: Self-pay | Admitting: Family Medicine

## 2011-12-10 ENCOUNTER — Other Ambulatory Visit: Payer: Self-pay | Admitting: *Deleted

## 2011-12-10 MED ORDER — ESTRADIOL 2 MG PO TABS: 2.0000 mg | ORAL_TABLET | Freq: Every day | ORAL | Status: DC

## 2011-12-18 ENCOUNTER — Other Ambulatory Visit: Payer: Self-pay | Admitting: *Deleted

## 2011-12-18 MED ORDER — PAROXETINE HCL 20 MG PO TABS: 20.0000 mg | ORAL_TABLET | Freq: Every day | ORAL | Status: DC

## 2011-12-22 HISTORY — PX: CHOLECYSTECTOMY: SHX55

## 2012-01-30 ENCOUNTER — Other Ambulatory Visit: Payer: Self-pay | Admitting: Family Medicine

## 2012-02-01 NOTE — Telephone Encounter (Signed)
Needs appointment

## 2012-02-29 ENCOUNTER — Other Ambulatory Visit: Payer: Self-pay | Admitting: Family Medicine

## 2012-02-29 NOTE — Telephone Encounter (Signed)
Must schedule appoinment

## 2012-03-03 ENCOUNTER — Other Ambulatory Visit: Payer: Self-pay | Admitting: Family Medicine

## 2012-08-13 ENCOUNTER — Other Ambulatory Visit: Payer: Self-pay | Admitting: Family Medicine

## 2012-08-26 ENCOUNTER — Ambulatory Visit (INDEPENDENT_AMBULATORY_CARE_PROVIDER_SITE_OTHER): Payer: BC Managed Care – PPO | Admitting: Family Medicine

## 2012-08-26 ENCOUNTER — Encounter: Payer: Self-pay | Admitting: Family Medicine

## 2012-08-26 VITALS — BP 126/71 | HR 64 | Wt 206.0 lb

## 2012-08-26 DIAGNOSIS — N390 Urinary tract infection, site not specified: Secondary | ICD-10-CM

## 2012-08-26 DIAGNOSIS — F3289 Other specified depressive episodes: Secondary | ICD-10-CM

## 2012-08-26 DIAGNOSIS — F329 Major depressive disorder, single episode, unspecified: Secondary | ICD-10-CM

## 2012-08-26 DIAGNOSIS — Z1231 Encounter for screening mammogram for malignant neoplasm of breast: Secondary | ICD-10-CM

## 2012-08-26 DIAGNOSIS — R319 Hematuria, unspecified: Secondary | ICD-10-CM

## 2012-08-26 DIAGNOSIS — F32A Depression, unspecified: Secondary | ICD-10-CM

## 2012-08-26 DIAGNOSIS — Z1211 Encounter for screening for malignant neoplasm of colon: Secondary | ICD-10-CM

## 2012-08-26 DIAGNOSIS — E785 Hyperlipidemia, unspecified: Secondary | ICD-10-CM

## 2012-08-26 DIAGNOSIS — Z79890 Hormone replacement therapy (postmenopausal): Secondary | ICD-10-CM

## 2012-08-26 LAB — POCT URINALYSIS DIPSTICK
Bilirubin, UA: NEGATIVE
Glucose, UA: NEGATIVE
Ketones, UA: NEGATIVE
Leukocytes, UA: NEGATIVE
Nitrite, UA: NEGATIVE
Protein, UA: NEGATIVE
Spec Grav, UA: >=1.03
Urobilinogen, UA: 0.2
pH, UA: 5.5

## 2012-08-26 MED ORDER — PAROXETINE HCL 40 MG PO TABS: 40.0000 mg | ORAL_TABLET | Freq: Every day | ORAL | Status: DC

## 2012-08-26 MED ORDER — ESTRADIOL 2 MG PO TABS: 2.0000 mg | ORAL_TABLET | Freq: Every day | ORAL | Status: DC

## 2012-08-26 NOTE — Progress Notes (Signed)
  Subjective:    Patient ID: Tomeeka Plaugher, female    DOB: February 03, 1951, 61 y.o.   MRN: [id number]  HPI Urinary sxs since June on and off.  More urgency but then only goes a small amout. No blood or fever or back pain.  HRT - needs refill on her estradiol. Doing well on this.  She primarily uses it for hot flashes.  Depression - Needs to incrase her paxil. Sleeping well.  Went back to work and hasn't really been exercising.    Review of Systems     Objective:   Physical Exam  Constitutional: She is oriented to person, place, and time. She appears well-developed and well-nourished.  HENT:  Head: Normocephalic and atraumatic.  Cardiovascular: Normal rate, regular rhythm and normal heart sounds.   Pulmonary/Chest: Effort normal and breath sounds normal.  Neurological: She is alert and oriented to person, place, and time.  Skin: Skin is warm and dry.  Psychiatric: She has a normal mood and affect. Her behavior is normal.          Assessment & Plan:  Urinary frequency - will check urinalysis for possible UTI. Urinalysis was positive for blood. We will send a culture. She's had symptoms on and off for the last 2 months. If the culture is negative then consider repeating urinalysis to make sure that the hematuria has cleared.  HRT-I. did refill her estradiol but discussed putting the tabs in half and trying to go to 1 mg a day.  Depression - PHQ-9 score of 7.We'll increase her Paxil 40 mg. When she feels like she is more stable then I did encourage her to try going back on her dose. She's been more careful. I did encourage more regular exercise as well.  Hyperlipidemia-due for CMP and fasting lipid panel.  Due for screening colonoscopy as well as screening mammogram. Orders placed for both.

## 2012-08-28 LAB — URINE CULTURE: Colony Count: 5000

## 2012-08-30 ENCOUNTER — Other Ambulatory Visit (INDEPENDENT_AMBULATORY_CARE_PROVIDER_SITE_OTHER): Payer: BC Managed Care – PPO | Admitting: Family Medicine

## 2012-08-30 ENCOUNTER — Other Ambulatory Visit: Payer: Self-pay | Admitting: Family Medicine

## 2012-08-30 DIAGNOSIS — R319 Hematuria, unspecified: Secondary | ICD-10-CM

## 2012-08-30 DIAGNOSIS — Z1231 Encounter for screening mammogram for malignant neoplasm of breast: Secondary | ICD-10-CM

## 2012-08-30 LAB — POCT URINALYSIS DIPSTICK
Bilirubin, UA: NEGATIVE
Glucose, UA: NEGATIVE
Ketones, UA: NEGATIVE
Leukocytes, UA: NEGATIVE
Nitrite, UA: NEGATIVE
Protein, UA: NEGATIVE
Spec Grav, UA: >=1.03
Urobilinogen, UA: 0.2
pH, UA: 5.5

## 2012-08-30 NOTE — Progress Notes (Signed)
  Subjective:    Patient ID: Yvonne Long, female    DOB: September 06, 1951, 61 y.o.   MRN: [id number]  HPI She is here for repeat urinalysis. She was having some urinary frequency so we did urinalysis that showed moderate blood and send it for culture. The culture was negative so asked to come back to make sure that the hematuria had cleared.   Review of Systems     Objective:   Physical Exam        Assessment & Plan:  Hematuria-her urinalysis is still positive for moderate blood today. Will start further workup for hematuria.

## 2012-08-30 NOTE — Progress Notes (Signed)
Patient is here for a recollection for a U/A. Urine sample is in the refrigerator if needed.

## 2012-08-31 ENCOUNTER — Telehealth: Payer: Self-pay | Admitting: *Deleted

## 2012-09-01 LAB — URINALYSIS, MICROSCOPIC ONLY: Casts: NONE SEEN

## 2012-09-01 LAB — URINALYSIS, ROUTINE W REFLEX MICROSCOPIC
Bilirubin Urine: NEGATIVE
Glucose, UA: NEGATIVE mg/dL
Ketones, ur: NEGATIVE mg/dL
Leukocytes, UA: NEGATIVE
Nitrite: NEGATIVE
Protein, ur: NEGATIVE mg/dL
Specific Gravity, Urine: 1.025 (ref 1.005–1.030)
Urobilinogen, UA: 0.2 mg/dL (ref 0.0–1.0)
pH: 5 (ref 5.0–8.0)

## 2012-09-01 NOTE — Telephone Encounter (Signed)
error 

## 2012-09-02 ENCOUNTER — Encounter: Payer: Self-pay | Admitting: Gastroenterology

## 2012-09-13 ENCOUNTER — Ambulatory Visit (INDEPENDENT_AMBULATORY_CARE_PROVIDER_SITE_OTHER): Payer: BC Managed Care – PPO

## 2012-09-13 DIAGNOSIS — R928 Other abnormal and inconclusive findings on diagnostic imaging of breast: Secondary | ICD-10-CM

## 2012-09-13 DIAGNOSIS — Z1231 Encounter for screening mammogram for malignant neoplasm of breast: Secondary | ICD-10-CM

## 2012-09-15 ENCOUNTER — Other Ambulatory Visit: Payer: Self-pay | Admitting: Family Medicine

## 2012-09-15 DIAGNOSIS — R928 Other abnormal and inconclusive findings on diagnostic imaging of breast: Secondary | ICD-10-CM

## 2012-09-16 ENCOUNTER — Ambulatory Visit
Admission: RE | Admit: 2012-09-16 | Discharge: 2012-09-16 | Disposition: A | Discharge status: Home / Self Care | Payer: BC Managed Care – PPO | Source: Ambulatory Visit | Attending: Family Medicine | Admitting: Family Medicine

## 2012-09-16 DIAGNOSIS — R928 Other abnormal and inconclusive findings on diagnostic imaging of breast: Secondary | ICD-10-CM

## 2012-10-10 ENCOUNTER — Encounter: Payer: BC Managed Care – PPO | Admitting: Gastroenterology

## 2012-10-14 LAB — LIPID PANEL
Cholesterol: 200 mg/dL (ref 0–200)
HDL: 63 mg/dL (ref 35–70)
LDL Cholesterol: 88 mg/dL
Triglycerides: 247 mg/dL — AB (ref 40–160)

## 2012-10-14 LAB — TSH: TSH: 0.94 u[IU]/mL (ref 0.41–5.90)

## 2012-10-14 LAB — CBC AND DIFFERENTIAL: Hemoglobin: 12.9 g/dL (ref 12.0–16.0)

## 2012-12-08 ENCOUNTER — Encounter: Payer: Self-pay | Admitting: Family Medicine

## 2012-12-08 ENCOUNTER — Ambulatory Visit (INDEPENDENT_AMBULATORY_CARE_PROVIDER_SITE_OTHER): Payer: BC Managed Care – PPO | Admitting: Family Medicine

## 2012-12-08 VITALS — BP 132/82 | HR 74 | Ht 66.0 in | Wt 212.0 lb

## 2012-12-08 DIAGNOSIS — R002 Palpitations: Secondary | ICD-10-CM

## 2012-12-08 DIAGNOSIS — E781 Pure hyperglyceridemia: Secondary | ICD-10-CM

## 2012-12-08 DIAGNOSIS — R0789 Other chest pain: Secondary | ICD-10-CM

## 2012-12-08 NOTE — Patient Instructions (Addendum)
Will call with lab results. If symptoms persist esp when off of work next week then let me know and we will schedule for heart monitor.

## 2012-12-08 NOTE — Progress Notes (Signed)
Subjective:    Patient ID: Yvonne Long, female    DOB: August 04, 1951, 61 y.o.   MRN: [id number]  HPI Some intermittant pressure over her upper chest for 3-4 days.  Feels like her heart is skipping. Last a few seconds. Happens when she feels stressed.  No change in caffeine intake.  About 1 cup of coffee a day.  No diaphoresis or SOB.  Has felt more fatigued. No fever or cough. Exercise does not aggravate or cause her symptoms. No alleviating symptoms. Discussed away on its own after a few seconds. She has no prior history of cardiac disease. She does have some family history of heart disease. Mostly in uncles.  Brought in labs from work.  Told TG are high.  Does do yoga. Says was doing well with exercise until her new job. She does take 1 fish oil tab daily and   Review of Systems BP 132/82  Pulse 74  Ht 5\' 6"  (1.676 m)  Wt 212 lb (96.163 kg)  BMI 34.22 kg/m2    Allergies  Allergen Reactions  . Codeine     Past Medical History  Diagnosis Date  . Diverticulum     Past Surgical History  Procedure Date  . Cystocele repair 2005    with bladder tack  . Abdominal hysterectomy 2005    Dr. Waylan Rocher  . Bilateral salpingoophorectomy 2005    Dr. Kevin Fenton  . Spine surgery 2/11    Lumbar Laminectomy-Dr. Manson Passey  . Cholecystectomy 2013    History   Social History  . Marital Status: Married    Spouse Name: Bruce    Number of Children: 2  . Years of Education: N/A   Occupational History  . PROGRAM MANAGER    Social History Main Topics  . Smoking status: Former Smoker    Quit date: 12/21/1994  . Smokeless tobacco: Not on file  . Alcohol Use: Yes     Comment: 1 alcoholic drink per day  . Drug Use: No  . Sexually Active:    Other Topics Concern  . Not on file   Social History Narrative   1 caffeinated drink/dayEnjoys yard work and yoga    Family History  Problem Relation Age of Onset  . Hypertension Mother   . Alcohol abuse Father   . Depression Father   .  Peripheral vascular disease Father   . Thrombosis Father     deceased from thrombosis of leg.  . Cancer Other     Brain cancer  . Stroke Other   . Heart attack Other     Outpatient Encounter Prescriptions as of 12/08/2012  Medication Sig Dispense Refill  . aspirin 81 MG tablet Take 81 mg by mouth daily.      . Calcium Carbonate-Vitamin D (CALTRATE 600+D) 600-400 MG-UNIT per tablet Take 1 tablet by mouth daily.        Marland Kitchen estradiol (ESTRACE) 2 MG tablet Take 1 tablet (2 mg total) by mouth daily.  90 tablet  1  . Omega-3 Fatty Acids (FISH OIL PO) Take 1 capsule by mouth daily.        Marland Kitchen PARoxetine (PAXIL) 40 MG tablet Take 1 tablet (40 mg total) by mouth daily.  90 tablet  1          Objective:   Physical Exam  Constitutional: She is oriented to person, place, and time. She appears well-developed and well-nourished.  HENT:  Head: Normocephalic and atraumatic.  Right Ear: External ear normal.  Left Ear: External  ear normal.  Nose: Nose normal.  Mouth/Throat: Oropharynx is clear and moist.       TMs and canals are clear.   Eyes: Conjunctivae normal and EOM are normal. Pupils are equal, round, and reactive to light.  Neck: Neck supple. No thyromegaly present.  Cardiovascular: Normal rate, regular rhythm and normal heart sounds.   Pulmonary/Chest: Effort normal and breath sounds normal. She has no wheezes.  Lymphadenopathy:    She has no cervical adenopathy.  Neurological: She is alert and oriented to person, place, and time.  Skin: Skin is warm and dry.  Psychiatric: She has a normal mood and affect.          Assessment & Plan:  Atypical chest pain.- Taking a baby asa daily. No prior hx of heart problems.  + fam hx of heart disease.  Will get EKG today.  EKG shows rate of 64 beats per minute, normal sinus rhythm, no acute changes, normal axis. She does have low voltage QRS in the lateral leads. Will also add d-dimer to blood work. I strong suspect this may be stress related  because it does happen when she feels anxious or stressed at work. 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 She still having symptoms even while on vacation and asked her please let me know and we will consider setting her up for a cardiac monitor and maybe even a treadmill stress test.  Hypertriglyceridemia - Recommend weight loss, exercise andlow fat dit. Increase fish oil to 4 a day.

## 2012-12-09 LAB — COMPLETE METABOLIC PANEL WITH GFR
ALT: 10 U/L (ref 0–35)
AST: 11 U/L (ref 0–37)
Albumin: 3.7 g/dL (ref 3.5–5.2)
Alkaline Phosphatase: 42 U/L (ref 39–117)
BUN: 12 mg/dL (ref 6–23)
CO2: 25 mEq/L (ref 19–32)
Calcium: 9.2 mg/dL (ref 8.4–10.5)
Chloride: 102 mEq/L (ref 96–112)
Creat: 0.86 mg/dL (ref 0.50–1.10)
GFR, Est African American: 85 mL/min
GFR, Est Non African American: 74 mL/min
Glucose, Bld: 85 mg/dL (ref 70–99)
Potassium: 4.4 mEq/L (ref 3.5–5.3)
Sodium: 139 mEq/L (ref 135–145)
Total Bilirubin: 0.3 mg/dL (ref 0.3–1.2)
Total Protein: 6.5 g/dL (ref 6.0–8.3)

## 2012-12-09 LAB — TROPONIN I: Troponin I: 0.01 ng/mL (ref <0.06)

## 2012-12-09 LAB — CBC WITH DIFFERENTIAL/PLATELET
Basophils Absolute: 0 10*3/uL (ref 0.0–0.1)
Basophils Relative: 1 % (ref 0–1)
Eosinophils Absolute: 0.2 10*3/uL (ref 0.0–0.7)
Eosinophils Relative: 2 % (ref 0–5)
HCT: 39 % (ref 36.0–46.0)
Hemoglobin: 13.1 g/dL (ref 12.0–15.0)
Lymphocytes Relative: 35 % (ref 12–46)
Lymphs Abs: 3 10*3/uL (ref 0.7–4.0)
MCH: 29 pg (ref 26.0–34.0)
MCHC: 33.6 g/dL (ref 30.0–36.0)
MCV: 86.3 fL (ref 78.0–100.0)
Monocytes Absolute: 0.5 10*3/uL (ref 0.1–1.0)
Monocytes Relative: 5 % (ref 3–12)
Neutro Abs: 4.8 10*3/uL (ref 1.7–7.7)
Neutrophils Relative %: 57 % (ref 43–77)
Platelets: 294 10*3/uL (ref 150–400)
RBC: 4.52 MIL/uL (ref 3.87–5.11)
RDW: 13.9 % (ref 11.5–15.5)
WBC: 8.5 10*3/uL (ref 4.0–10.5)

## 2012-12-09 LAB — TSH: TSH: 1.127 u[IU]/mL (ref 0.350–4.500)

## 2012-12-09 LAB — D-DIMER, QUANTITATIVE: D-Dimer, Quant: 0.27 ug/mL-FEU (ref 0.00–0.48)

## 2012-12-09 LAB — CK TOTAL AND CKMB (NOT AT ARMC)
CK, MB: 1.8 ng/mL (ref 0.3–4.0)
Total CK: 46 U/L (ref 7–177)

## 2012-12-22 ENCOUNTER — Encounter: Payer: Self-pay | Admitting: *Deleted

## 2013-02-21 ENCOUNTER — Ambulatory Visit (INDEPENDENT_AMBULATORY_CARE_PROVIDER_SITE_OTHER): Payer: Managed Care, Other (non HMO) | Admitting: Family Medicine

## 2013-02-21 ENCOUNTER — Encounter: Payer: Self-pay | Admitting: Family Medicine

## 2013-02-21 VITALS — BP 125/56 | HR 61 | Temp 97.8°F | Wt 213.0 lb

## 2013-02-21 DIAGNOSIS — B9689 Other specified bacterial agents as the cause of diseases classified elsewhere: Secondary | ICD-10-CM

## 2013-02-21 DIAGNOSIS — J329 Chronic sinusitis, unspecified: Secondary | ICD-10-CM

## 2013-02-21 DIAGNOSIS — A499 Bacterial infection, unspecified: Secondary | ICD-10-CM

## 2013-02-21 MED ORDER — AMOXICILLIN-POT CLAVULANATE 500-125 MG PO TABS: ORAL_TABLET | ORAL | Status: AC

## 2013-02-21 NOTE — Progress Notes (Signed)
CC: Yvonne Long is a 62 y.o. female is here for Sinusitis and check ears   Subjective: HPI:  Patient complains of facial pressure, ear pressure, and dizziness for one week. Pressure symptoms are described as moderate in severity, dizziness is mild in severity. Dizziness is worsened with leaning forward or with sudden movements of the head. Facial pressures worse with leaning forward. Ear pressures relieved almost 100% with applying heat to the lateral sides of her neck. Nothing else makes symptoms better or worse. She has been trying nasal saline washes without much improvement of her symptoms. Symptoms seem to be getting worse on a daily basis. She feels mild fatigue but denies fevers or chills. She denies headaches other than above, motor or sensory disturbances, hearing loss, roaring of the ears, double vision, cough, shortness of breath, nor dysphagia. She does mention occasional ringing in ears at night And a thick white nasal discharge since the above symptoms started.   Review Of Systems Outlined In HPI  Past Medical History  Diagnosis Date  . Diverticulum      Family History  Problem Relation Age of Onset  . Hypertension Mother   . Alcohol abuse Father   . Depression Father   . Peripheral vascular disease Father   . Thrombosis Father     deceased from thrombosis of leg.  . Cancer Other     Brain cancer  . Stroke Other   . Heart attack Other      History  Substance Use Topics  . Smoking status: Former Smoker    Quit date: 12/21/1994  . Smokeless tobacco: Not on file  . Alcohol Use: Yes     Comment: 1 alcoholic drink per day     Objective: Filed Vitals:   02/21/13 1631  BP: 125/56  Pulse: 61  Temp: 97.8 F (36.6 C)    General: Alert and Oriented, No Acute Distress HEENT: Pupils equal, round, reactive to light. Conjunctivae clear.  External ears unremarkable, canals clear with intact TMs with appropriate landmarks. Both middle ears appear to have mild  condensation. Pink inferior turbinates.  Moist mucous membranes, pharynx without inflammation nor lesions.  Neck supple without palpable lymphadenopathy nor abnormal masses. Maxillary sinus tenderness to percussion bilaterally. Neuro: Air conduction is greater than bone conduction bilaterally, cranial nerves II through XII grossly intact Lungs: Clear to auscultation bilaterally, no wheezing/ronchi/rales.  Comfortable work of breathing. Good air movement. Cardiac: Regular rate and rhythm. Normal S1/S2.  No murmurs, rubs, nor gallops.  No carotid bruits Extremities: No peripheral edema.  Strong peripheral pulses.  Mental Status: No depression, anxiety, nor agitation. Skin: Warm and dry.  Assessment & Plan: Yvonne Long was seen today for sinusitis and check ears.  Diagnoses and associated orders for this visit:  Bacterial sinusitis - amoxicillin-clavulanate (AUGMENTIN) 500-125 MG per tablet; Take one by mouth every 8 hours for ten total days.  Bacterial sinusitis: Discussed diagnosis with patient encouraged her to start regimen of Augmentin continue nasal saline washes and consider Alka-Seltzer cold and sinus as needed.Signs and symptoms requring emergent/urgent reevaluation were discussed with the patient.   Return if symptoms worsen or fail to improve.

## 2013-08-22 ENCOUNTER — Other Ambulatory Visit: Payer: Self-pay | Admitting: Family Medicine

## 2013-09-25 ENCOUNTER — Other Ambulatory Visit: Payer: Self-pay | Admitting: Family Medicine

## 2013-11-07 ENCOUNTER — Other Ambulatory Visit: Payer: Self-pay | Admitting: Family Medicine

## 2013-11-07 DIAGNOSIS — Z1231 Encounter for screening mammogram for malignant neoplasm of breast: Secondary | ICD-10-CM

## 2013-11-24 ENCOUNTER — Ambulatory Visit (HOSPITAL_BASED_OUTPATIENT_CLINIC_OR_DEPARTMENT_OTHER)
Admission: RE | Admit: 2013-11-24 | Discharge: 2013-11-24 | Disposition: A | Discharge status: Home / Self Care | Payer: Managed Care, Other (non HMO) | Source: Ambulatory Visit | Attending: Family Medicine | Admitting: Family Medicine

## 2013-11-24 DIAGNOSIS — Z1231 Encounter for screening mammogram for malignant neoplasm of breast: Secondary | ICD-10-CM

## 2013-12-15 ENCOUNTER — Encounter: Payer: Self-pay | Admitting: Family Medicine

## 2013-12-15 ENCOUNTER — Ambulatory Visit (INDEPENDENT_AMBULATORY_CARE_PROVIDER_SITE_OTHER): Payer: Managed Care, Other (non HMO) | Admitting: Family Medicine

## 2013-12-15 VITALS — BP 128/72 | HR 72 | Wt 215.0 lb

## 2013-12-15 DIAGNOSIS — L659 Nonscarring hair loss, unspecified: Secondary | ICD-10-CM

## 2013-12-15 DIAGNOSIS — L219 Seborrheic dermatitis, unspecified: Secondary | ICD-10-CM

## 2013-12-15 LAB — TSH: TSH: 1.043 u[IU]/mL (ref 0.350–4.500)

## 2013-12-15 MED ORDER — KETOCONAZOLE 2 % EX SHAM: 1.0000 "application " | MEDICATED_SHAMPOO | CUTANEOUS | Status: DC

## 2013-12-15 NOTE — Progress Notes (Signed)
CC: Yvonne Long is a 62 y.o. female is here for wants thyroid check   Subjective: HPI:  Complains of hair loss that is affecting the scalp only that has been present for the last 2-3 weeks seems to be worsening on a weekly basis. She finds it has been equally spread throughout the scalp and nonfocal. She denies hair loss anywhere else on her body. She is specifically concerned that she may have a thyroid abnormality and is requesting testing. She reports difficulty with losing weight but denies any fatigue, anxiety, skin changes, diarrhea, constipation, nor rapid heartbeat.  She reports mild itching of the scalp with some mild scaling localized mostly to the front of the scalp.  She dyes her hair but denies any other interventions to her scalp recently or remotely.   Review Of Systems Outlined In HPI  Past Medical History  Diagnosis Date  . Diverticulum      Family History  Problem Relation Age of Onset  . Hypertension Mother   . Alcohol abuse Father   . Depression Father   . Peripheral vascular disease Father   . Thrombosis Father     deceased from thrombosis of leg.  . Cancer Other     Brain cancer  . Stroke Other   . Heart attack Other      History  Substance Use Topics  . Smoking status: Former Smoker    Quit date: 12/21/1994  . Smokeless tobacco: Not on file  . Alcohol Use: Yes     Comment: 1 alcoholic drink per day     Objective: Filed Vitals:   12/15/13 1034  BP: 128/72  Pulse: 72    General: Alert and Oriented, No Acute Distress HEENT: Pupils equal, round, reactive to light. Conjunctivae clear.   moist he does membranes without palpable thyromegaly. Scalp has mild hyperkeratosis mostly anterior with excoriations. Pull Test is negative hair thinning is homogenous. Lungs: clear comfortable work of breathing  Cardiac: Regular rate and rhythm.  Extremities: No peripheral edema.  Strong peripheral pulses.  Mental Status: No depression, anxiety, nor  agitation. Skin: Warm and dry.  Assessment & Plan: Yvonne Long was seen today for wants thyroid check.  Diagnoses and associated orders for this visit:  Hair loss - TSH  Seborrheic dermatitis - Discontinue: ketoconazole (NIZORAL) 2 % shampoo; Apply 1 application topically 2 (two) times a week. For up to 8 weeks. - ketoconazole (NIZORAL) 2 % shampoo; Apply 1 application topically 2 (two) times a week. For up to 8 weeks.    Suspect itching is due to a mild seborrheic dermatitis however I cannot account for her hair loss being caused by this. Start ketoconazole. Will check thyroid per her request.  Return if symptoms worsen or fail to improve.

## 2014-02-21 ENCOUNTER — Other Ambulatory Visit: Payer: Self-pay | Admitting: *Deleted

## 2014-02-21 NOTE — Telephone Encounter (Signed)
Called and informed pt that she will need to make an appt. She was told that rx will be sent however she will be given enough to get her through until her appt. Pt voiced understanding and stated that she will call back to schedule an appt.Elouise Munroe  Called pt back to ask which local pharmacy would she like for me to send the rx to she stated that she made an appt for tomorrow and to just hold ordering the rx for now.Audelia Hives Blue

## 2014-02-22 ENCOUNTER — Encounter: Payer: Self-pay | Admitting: Family Medicine

## 2014-02-22 ENCOUNTER — Ambulatory Visit (INDEPENDENT_AMBULATORY_CARE_PROVIDER_SITE_OTHER): Payer: Managed Care, Other (non HMO) | Admitting: Family Medicine

## 2014-02-22 VITALS — BP 134/69 | HR 66 | Temp 97.7°F | Ht 66.0 in | Wt 216.0 lb

## 2014-02-22 DIAGNOSIS — E785 Hyperlipidemia, unspecified: Secondary | ICD-10-CM

## 2014-02-22 DIAGNOSIS — F418 Other specified anxiety disorders: Secondary | ICD-10-CM

## 2014-02-22 DIAGNOSIS — F341 Dysthymic disorder: Secondary | ICD-10-CM

## 2014-02-22 MED ORDER — PAROXETINE HCL 40 MG PO TABS: ORAL_TABLET | ORAL | Status: DC

## 2014-02-22 MED ORDER — PAROXETINE HCL 20 MG PO TABS: 20.0000 mg | ORAL_TABLET | Freq: Every day | ORAL | Status: DC

## 2014-02-22 NOTE — Progress Notes (Signed)
   Subjective:    Patient ID: Yvonne Long, female    DOB: 12-Apr-1951, 63 y.o.   MRN: [id number]  HPI Depression/Anxiety - Well controlled. Says when she tries to go down on her dose she doesn't feel well and gets really tearful. She has been taking 20mg  for the last 6 months and has been doing really well Ms. She got to 40 for a brief period time. She does not want to discontinue it. She really would like to it permanently.  Hyperlipidemia-last cholesterol was in 2012 and well. It was abnormal at that time and she is due to repeat this.   Review of Systems     Objective:   Physical Exam  Constitutional: She is oriented to person, place, and time. She appears well-developed and well-nourished.  HENT:  Head: Normocephalic and atraumatic.  Cardiovascular: Normal rate, regular rhythm and normal heart sounds.   Pulmonary/Chest: Effort normal and breath sounds normal.  Neurological: She is alert and oriented to person, place, and time.  Skin: Skin is warm and dry.  Psychiatric: She has a normal mood and affect. Her behavior is normal.          Assessment & Plan:  Depression/Anxiety - GAD- 7 score of 0. Well controlled. Continue current regimen. Followup in one year. Mail order prescription presented today as she has not per Yet.  Hyperlipidemia-due to repeat today. We'll call the results once available. Continue work on diet exercise and weight loss.

## 2014-05-05 ENCOUNTER — Emergency Department
Admission: EM | Admit: 2014-05-05 | Discharge: 2014-05-05 | Disposition: A | Discharge status: Home / Self Care | Payer: Managed Care, Other (non HMO) | Source: Home / Self Care | Attending: Family Medicine | Admitting: Family Medicine

## 2014-05-05 ENCOUNTER — Encounter: Payer: Self-pay | Admitting: Emergency Medicine

## 2014-05-05 DIAGNOSIS — IMO0002 Reserved for concepts with insufficient information to code with codable children: Secondary | ICD-10-CM

## 2014-05-05 DIAGNOSIS — S239XXA Sprain of unspecified parts of thorax, initial encounter: Secondary | ICD-10-CM

## 2014-05-05 MED ORDER — CYCLOBENZAPRINE HCL 10 MG PO TABS
10.0000 mg | ORAL_TABLET | Freq: Three times a day (TID) | ORAL | Status: DC | PRN
Start: 2014-05-05 — End: 2015-07-17

## 2014-05-05 MED ORDER — KETOROLAC TROMETHAMINE 30 MG/ML IJ SOLN
30.0000 mg | Freq: Once | INTRAMUSCULAR | Status: AC
  Administered 2014-05-05: 30 mg via INTRAMUSCULAR

## 2014-05-05 MED ORDER — PREDNISONE 50 MG PO TABS: ORAL_TABLET | ORAL | Status: DC

## 2014-05-05 NOTE — ED Notes (Signed)
Angie complains of back pain for 10 days. She reports the pain as a tightness and aching pain that is a 6/10 on the pain scale. She has taken ibuprofen and applied heat and cold to area with some relief.

## 2014-05-05 NOTE — ED Provider Notes (Signed)
CSN: [id number]     Arrival date & time 05/05/14  0912 History   First MD Initiated Contact with Patient 05/05/14 0914     Chief Complaint  Patient presents with  . Back Pain    HPI  The patient presents today with back pain. Location: upper back  Timing: present for the past 2 weeks. Pt works as a Air traffic controller, Training and development officer. Believes she pulled her back about 2 weeks ago.  Description: R sided upper back pain. Worse with R arm movement. No radicular sxs.  Worse with: above Better with: above  Trauma: no Bladder/bowel incontinence: no Weakness: no Fever/chills: no Night pain:no Unexplained weight loss: no Cancer/immunosuppression: no PMH of osteoporosis or chronic steroid use:  no   Past Medical History  Diagnosis Date  . Diverticulum    Past Surgical History  Procedure Laterality Date  . Cystocele repair  2005    with bladder tack  . Abdominal hysterectomy  2005    Dr. Toy Cookey  . Bilateral salpingoophorectomy  2005    Dr. Noralee Stain  . Spine surgery  2/11    Lumbar Laminectomy-Dr. Owens Shark  . Cholecystectomy  2013   Family History  Problem Relation Age of Onset  . Hypertension Mother   . Alcohol abuse Father   . Depression Father   . Peripheral vascular disease Father   . Thrombosis Father     deceased from thrombosis of leg.  . Cancer Other     Brain cancer  . Stroke Other   . Heart attack Other    History  Substance Use Topics  . Smoking status: Former Smoker    Quit date: 12/21/1994  . Smokeless tobacco: Not on file  . Alcohol Use: Yes     Comment: 1 alcoholic drink per day   OB History   Grav Para Term Preterm Abortions TAB SAB Ect Mult Living                 Review of Systems  All other systems reviewed and are negative.   Allergies  Codeine  Home Medications   Prior to Admission medications   Medication Sig Start Date End Date Taking? Authorizing Provider  aspirin 81 MG tablet Take 81 mg by mouth daily.   Yes Historical Provider, MD    Calcium Carbonate-Vitamin D (CALTRATE 600+D) 600-400 MG-UNIT per tablet Take 1 tablet by mouth daily.     Yes Historical Provider, MD  estradiol (ESTRACE) 2 MG tablet Take 1 tablet (2 mg total) by mouth daily. 08/26/12  Yes Hali Marry, MD  Omega-3 Fatty Acids (FISH OIL PO) Take 1 capsule by mouth daily.     Yes Historical Provider, MD  PARoxetine (PAXIL) 20 MG tablet Take 1 tablet (20 mg total) by mouth daily. TAKE ONE TABLET BY MOUTH EVERY DAY 02/22/14  Yes Hali Marry, MD  VITAMIN D, CHOLECALCIFEROL, PO Take by mouth.   Yes Historical Provider, MD   BP 137/77  Pulse 63  Temp(Src) 98.1 F (36.7 C) (Oral)  Ht 5\' 6"  (1.676 m)  Wt 210 lb (95.255 kg)  BMI 33.91 kg/m2  SpO2 97% Physical Exam  Constitutional: She appears well-developed and well-nourished.  HENT:  Head: Normocephalic and atraumatic.  Eyes: Conjunctivae are normal. Pupils are equal, round, and reactive to light.  Neck: Normal range of motion. Neck supple.  Cardiovascular: Normal rate and regular rhythm.   Pulmonary/Chest: Effort normal and breath sounds normal.  Abdominal: Soft.  Musculoskeletal:  Back:  + TTP over affected area  Mild pain with movement.    Neurological: She is alert.  Skin: Skin is warm.    ED Course  Procedures (including critical care time) Labs Review Labs Reviewed - No data to display  Imaging Review No results found.   MDM   1. Thoracic sprain and strain    Suspect MSK etiology of sxs.  Discussed imaging. Pt deferred.  Toradol 30mg  IM x1 at pt's request Will place on short course of prednisone and flexeril.  Discussed general and MSK red flags.  Follow up as needed.     The patient and/or caregiver has been counseled thoroughly with regard to treatment plan and/or medications prescribed including dosage, schedule, interactions, rationale for use, and possible side effects and they verbalize understanding. Diagnoses and expected course of recovery discussed  and will return if not improved as expected or if the condition worsens. Patient and/or caregiver verbalized understanding.         Shanda Howells, MD 05/05/14 [phone]

## 2014-12-04 ENCOUNTER — Ambulatory Visit (INDEPENDENT_AMBULATORY_CARE_PROVIDER_SITE_OTHER): Payer: Managed Care, Other (non HMO) | Admitting: Family Medicine

## 2014-12-04 ENCOUNTER — Encounter: Payer: Self-pay | Admitting: Family Medicine

## 2014-12-04 ENCOUNTER — Telehealth: Payer: Self-pay | Admitting: *Deleted

## 2014-12-04 VITALS — BP 149/77 | HR 70 | Temp 98.3°F

## 2014-12-04 DIAGNOSIS — J069 Acute upper respiratory infection, unspecified: Secondary | ICD-10-CM

## 2014-12-04 DIAGNOSIS — H6501 Acute serous otitis media, right ear: Secondary | ICD-10-CM

## 2014-12-04 DIAGNOSIS — R509 Fever, unspecified: Secondary | ICD-10-CM

## 2014-12-04 LAB — POC INFLUENZA A&B (BINAX/QUICKVUE)
Influenza A, POC: NEGATIVE
Influenza B, POC: NEGATIVE

## 2014-12-04 MED ORDER — AMOXICILLIN-POT CLAVULANATE 875-125 MG PO TABS: 1.0000 | ORAL_TABLET | Freq: Two times a day (BID) | ORAL | Status: DC

## 2014-12-04 NOTE — Progress Notes (Signed)
   Subjective:    Patient ID: Yvonne Long, female    DOB: 11-Feb-1951, 63 y.o.   MRN: [id number]  HPI 4 days ago got chills.  Then next day felt really achey.  Last night had stressed. Temp has been 98.  Left ear is swollen. Can't hear anything of the left.  No cough, sneezing or ST.  No GI sxs.     Review of Systems     Objective:   Physical Exam  Constitutional: She is oriented to person, place, and time. She appears well-developed and well-nourished.  HENT:  Head: Normocephalic and atraumatic.  Right Ear: External ear normal.  Left Ear: External ear normal.  Nose: Nose normal.  Mouth/Throat: Oropharynx is clear and moist.  TMs and canals are clear. There is some fluid behind the right TM. Left left canal was irrigated bc of cerumen impaction and the TM is clear.   Eyes: Conjunctivae and EOM are normal. Pupils are equal, round, and reactive to light.  Neck: Neck supple. No thyromegaly present.  Cardiovascular: Normal rate, regular rhythm and normal heart sounds.   Pulmonary/Chest: Effort normal and breath sounds normal. She has no wheezes.  Lymphadenopathy:    She has no cervical adenopathy.  Neurological: She is alert and oriented to person, place, and time.  Skin: Skin is warm and dry.  Psychiatric: She has a normal mood and affect.          Assessment & Plan:  Upper respiratory infection with possible early acute otitis media on the right. She does have some fluid bubbles behind the membrane but no erythema. Going to go ahead and give her prescription for Augmentin to fill if she feels like she's getting worse or just not improving in the next few days. Can use Tylenol and ibuprofen for pain relief for her ear pain.

## 2014-12-04 NOTE — Telephone Encounter (Signed)
Pt called and stated that she has the flu and wanted to know if Dr. Madilyn Fireman would call something in for her so she doesn't expose anyone. I asked if she was confirmed she stated no and told me about her sxs. I advised that she make an appt and transferred her to scheduling.Audelia Hives [place]

## 2014-12-04 NOTE — Patient Instructions (Signed)
Okay to fill the antibiotic if he starts feeling worse or just not improving in the next 2-3 days. Can use Tylenol and ibuprofen for pain relief. Call if not improving on the antibiotic if you do need to start it.

## 2015-03-28 ENCOUNTER — Other Ambulatory Visit: Payer: Self-pay | Admitting: Family Medicine

## 2015-03-28 MED ORDER — PAROXETINE HCL 20 MG PO TABS: 20.0000 mg | ORAL_TABLET | Freq: Every day | ORAL | Status: DC

## 2015-04-16 DIAGNOSIS — E669 Obesity, unspecified: Secondary | ICD-10-CM | POA: Insufficient documentation

## 2015-04-16 DIAGNOSIS — R7303 Prediabetes: Secondary | ICD-10-CM

## 2015-04-16 DIAGNOSIS — E1169 Type 2 diabetes mellitus with other specified complication: Secondary | ICD-10-CM | POA: Insufficient documentation

## 2015-04-16 HISTORY — DX: Prediabetes: R73.03

## 2015-07-05 ENCOUNTER — Telehealth: Payer: Self-pay | Admitting: Family Medicine

## 2015-07-05 ENCOUNTER — Other Ambulatory Visit: Payer: Self-pay | Admitting: Family Medicine

## 2015-07-05 MED ORDER — PAROXETINE HCL 20 MG PO TABS: 20.0000 mg | ORAL_TABLET | Freq: Every day | ORAL | Status: DC

## 2015-07-05 NOTE — Telephone Encounter (Signed)
Please call patient and let her know that we did refill her Paxil but she will need an upcoming appointment or we will not refill it again. Last office visit was 7 months ago.

## 2015-07-08 NOTE — Telephone Encounter (Signed)
Pt put on schedule for next week.

## 2015-07-17 ENCOUNTER — Encounter: Payer: Self-pay | Admitting: Family Medicine

## 2015-07-17 ENCOUNTER — Ambulatory Visit (INDEPENDENT_AMBULATORY_CARE_PROVIDER_SITE_OTHER): Payer: Managed Care, Other (non HMO) | Admitting: Family Medicine

## 2015-07-17 VITALS — BP 111/70 | HR 67 | Ht 66.0 in | Wt 200.0 lb

## 2015-07-17 DIAGNOSIS — Z23 Encounter for immunization: Secondary | ICD-10-CM

## 2015-07-17 DIAGNOSIS — Z1231 Encounter for screening mammogram for malignant neoplasm of breast: Secondary | ICD-10-CM

## 2015-07-17 DIAGNOSIS — E785 Hyperlipidemia, unspecified: Secondary | ICD-10-CM

## 2015-07-17 DIAGNOSIS — F330 Major depressive disorder, recurrent, mild: Secondary | ICD-10-CM | POA: Diagnosis not present

## 2015-07-17 DIAGNOSIS — Z6832 Body mass index (BMI) 32.0-32.9, adult: Secondary | ICD-10-CM

## 2015-07-17 MED ORDER — PAROXETINE HCL 20 MG PO TABS: 20.0000 mg | ORAL_TABLET | Freq: Every day | ORAL | Status: DC

## 2015-07-17 NOTE — Progress Notes (Signed)
   Subjective:    Patient ID: Yvonne Long, female    DOB: Apr 22, 1951, 64 y.o.   MRN: [id number]  HPI Follow-up depression. Last seen in 2015 for this condition. She is currently on paroxetine 20 mg daily..  Hypertriglyceridemia-last labs checked were in 2013. We ordered one last year but she never went for the blood work. She does take the shoulder help reduce this.  She is on Qsymia with bariatric clinic.  She has lost 14 lbs.  She has sbeen doing well.   Review of Systems     Objective:   Physical Exam  Constitutional: She is oriented to person, place, and time. She appears well-developed and well-nourished.  HENT:  Head: Normocephalic and atraumatic.  Cardiovascular: Normal rate, regular rhythm and normal heart sounds.   Pulmonary/Chest: Effort normal and breath sounds normal.  Neurological: She is alert and oriented to person, place, and time.  Skin: Skin is warm and dry.  Psychiatric: She has a normal mood and affect. Her behavior is normal.          Assessment & Plan:  Depression-PHQ 9 score of 0.  Gad 7 score of 2. She does complain of feeling nervous several days of the week and feels like she worries a little too much about things. Otherwise that she feels like her symptoms are well controlled. Well controlled. She is not reasy to wean it. Says she gets weepy when she stops it.  Prefers to stay on it for life. Continue current regimen. Follow-up in 6-9 months when due for next refill.  Hypertriglyceridemia- due to recheck  Takes 1 fish oil tab a day. She will go to the lab today fasting.  BMI 32-currently going to bariatric clinic and is on Qsymia. She is Re: Lost 14 pounds and is doing fantastic. No regular exercise presently but says  she is very active with her 2 jobs.  Shingles vaccine given today.   Discussed need for mammogram. Will call in place order today.

## 2015-07-18 LAB — COMPLETE METABOLIC PANEL WITH GFR
ALT: 19 U/L (ref 6–29)
AST: 17 U/L (ref 10–35)
Albumin: 4.2 g/dL (ref 3.6–5.1)
Alkaline Phosphatase: 62 U/L (ref 33–130)
BUN: 16 mg/dL (ref 7–25)
CO2: 24 mEq/L (ref 20–31)
Calcium: 9.6 mg/dL (ref 8.6–10.4)
Chloride: 107 mEq/L (ref 98–110)
Creat: 1.02 mg/dL — ABNORMAL HIGH (ref 0.50–0.99)
GFR, Est African American: 68 mL/min (ref >=60)
GFR, Est Non African American: 59 mL/min — ABNORMAL LOW (ref >=60)
Glucose, Bld: 108 mg/dL — ABNORMAL HIGH (ref 65–99)
Potassium: 4.4 mEq/L (ref 3.5–5.3)
Sodium: 142 mEq/L (ref 135–146)
Total Bilirubin: 0.4 mg/dL (ref 0.2–1.2)
Total Protein: 6.7 g/dL (ref 6.1–8.1)

## 2015-07-18 LAB — LIPID PANEL
Cholesterol: 192 mg/dL (ref 125–200)
HDL: 50 mg/dL (ref >=46)
LDL Cholesterol: 111 mg/dL (ref <130)
Total CHOL/HDL Ratio: 3.8 Ratio (ref <=5.0)
Triglycerides: 155 mg/dL — ABNORMAL HIGH (ref <150)
VLDL: 31 mg/dL — ABNORMAL HIGH (ref <30)

## 2015-07-18 LAB — TSH: TSH: 1.431 u[IU]/mL (ref 0.350–4.500)

## 2015-07-19 ENCOUNTER — Other Ambulatory Visit: Payer: Self-pay | Admitting: Family Medicine

## 2015-07-19 DIAGNOSIS — R7989 Other specified abnormal findings of blood chemistry: Secondary | ICD-10-CM

## 2015-08-01 ENCOUNTER — Ambulatory Visit (INDEPENDENT_AMBULATORY_CARE_PROVIDER_SITE_OTHER): Payer: Managed Care, Other (non HMO)

## 2015-08-01 DIAGNOSIS — Z1231 Encounter for screening mammogram for malignant neoplasm of breast: Secondary | ICD-10-CM | POA: Diagnosis not present

## 2015-08-22 ENCOUNTER — Ambulatory Visit (INDEPENDENT_AMBULATORY_CARE_PROVIDER_SITE_OTHER): Payer: Managed Care, Other (non HMO) | Admitting: Family Medicine

## 2015-08-22 ENCOUNTER — Encounter: Payer: Self-pay | Admitting: Family Medicine

## 2015-08-22 VITALS — BP 125/76 | HR 63 | Temp 97.7°F | Ht 66.0 in | Wt 198.0 lb

## 2015-08-22 DIAGNOSIS — R312 Other microscopic hematuria: Secondary | ICD-10-CM | POA: Diagnosis not present

## 2015-08-22 DIAGNOSIS — R3912 Poor urinary stream: Secondary | ICD-10-CM | POA: Diagnosis not present

## 2015-08-22 DIAGNOSIS — R3129 Other microscopic hematuria: Secondary | ICD-10-CM

## 2015-08-22 LAB — POCT URINALYSIS DIPSTICK
Bilirubin, UA: NEGATIVE
Blood, UA: NEGATIVE
Glucose, UA: NEGATIVE
Ketones, UA: NEGATIVE
Leukocytes, UA: NEGATIVE
Nitrite, UA: NEGATIVE
Protein, UA: NEGATIVE
Spec Grav, UA: 1.015
Urobilinogen, UA: 0.2
pH, UA: 7

## 2015-08-22 NOTE — Progress Notes (Signed)
   Subjective:    Patient ID: Yvonne Long, female    DOB: 1951/10/13, 64 y.o.   MRN: [id number]  HPI She is here for follow-up for microscopic hematuria. She was noted to have some blood in her urine back in 2013. We did send it for micro-review at that time which only showed 2 red blood cells under high power field. She was supposed to return for a repeat urinalysis. She has been asymptomatic she's not had any urinary symptoms. No gross blood in the urine.  Occ notices a week stream. Felt like wasn't emptying well but says it has actually better. 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  Hx of bladder tack.   Review of Systems     Objective:   Physical Exam  Constitutional: She is oriented to person, place, and time. She appears well-developed and well-nourished.  HENT:  Head: Normocephalic and atraumatic.  Eyes: Conjunctivae and EOM are normal.  Cardiovascular: Normal rate.   Pulmonary/Chest: Effort normal.  Neurological: She is alert and oriented to person, place, and time.  Skin: Skin is dry. No pallor.  Psychiatric: She has a normal mood and affect. Her behavior is normal.          Assessment & Plan:  Microscopic hematuria/ weak urine stream - did sick today was negative. Was sent for micro-review just for confirmation. This was negative but I think we can rule out persistent hematuria at this point. Her recent symptoms seem to have resolved on their own. Certainly if they recur persist we can always get her in with urology and also make sure that her bladder tack is still intact.

## 2015-08-23 LAB — MICROALBUMIN / CREATININE URINE RATIO
Creatinine, Urine: 71 mg/dL
Microalb Creat Ratio: 5.6 mg/g (ref 0.0–30.0)
Microalb, Ur: 0.4 mg/dL (ref <2.0)

## 2015-11-20 ENCOUNTER — Encounter: Payer: Self-pay | Admitting: Family Medicine

## 2015-11-20 ENCOUNTER — Ambulatory Visit (INDEPENDENT_AMBULATORY_CARE_PROVIDER_SITE_OTHER): Payer: Managed Care, Other (non HMO)

## 2015-11-20 ENCOUNTER — Ambulatory Visit (INDEPENDENT_AMBULATORY_CARE_PROVIDER_SITE_OTHER): Payer: Managed Care, Other (non HMO) | Admitting: Family Medicine

## 2015-11-20 VITALS — BP 130/84 | HR 72 | Wt 194.0 lb

## 2015-11-20 DIAGNOSIS — M25561 Pain in right knee: Secondary | ICD-10-CM

## 2015-11-20 DIAGNOSIS — M25462 Effusion, left knee: Secondary | ICD-10-CM

## 2015-11-20 DIAGNOSIS — M25562 Pain in left knee: Secondary | ICD-10-CM

## 2015-11-20 HISTORY — DX: Pain in left knee: M25.562

## 2015-11-20 NOTE — Assessment & Plan Note (Signed)
Likely related to DJD. Radiology read pending. Injection today. Continue NSAIDs as needed. Return in one month.

## 2015-11-20 NOTE — Patient Instructions (Signed)
Thank you for coming in today. Call or go to the ER if you develop a large red swollen joint with extreme pain or oozing puss.   Arthritis Arthritis is a term that is commonly used to refer to joint pain or joint disease. There are more than 100 types of arthritis. CAUSES The most common cause of this condition is wear and tear of a joint. Other causes include:  Gout.  Inflammation of a joint.  An infection of a joint.  Sprains and other injuries near the joint.  A drug reaction or allergic reaction. In some cases, the cause may not be known. SYMPTOMS The main symptom of this condition is pain in the joint with movement. Other symptoms include:  Redness, swelling, or stiffness at a joint.  Warmth coming from the joint.  Fever.  Overall feeling of illness. DIAGNOSIS This condition may be diagnosed with a physical exam and tests, including:  Blood tests.  Urine tests.  Imaging tests, such as MRI, X-rays, or a CT scan. Sometimes, fluid is removed from a joint for testing. TREATMENT Treatment for this condition may involve:  Treatment of the cause, if it is known.  Rest.  Raising (elevating) the joint.  Applying cold or hot packs to the joint.  Medicines to improve symptoms and reduce inflammation.  Injections of a steroid such as cortisone into the joint to help reduce pain and inflammation. Depending on the cause of your arthritis, you may need to make lifestyle changes to reduce stress on your joint. These changes may include exercising more and losing weight. HOME CARE INSTRUCTIONS Medicines  Take over-the-counter and prescription medicines only as told by your health care provider.  Do not take aspirin to relieve pain if gout is suspected. Activities  Rest your joint if told by your health care provider. Rest is important when your disease is active and your joint feels painful, swollen, or stiff.  Avoid activities that make the pain worse. It is  important to balance activity with rest.  Exercise your joint regularly with range-of-motion exercises as told by your health care provider. Try doing low-impact exercise, such as:  Swimming.  Water aerobics.  Biking.  Walking. Joint Care  If your joint is swollen, keep it elevated if told by your health care provider.  If your joint feels stiff in the morning, try taking a warm shower.  If directed, apply heat to the joint. If you have diabetes, do not apply heat without permission from your health care provider.  Put a towel between the joint and the hot pack or heating pad.  Leave the heat on the area for 20-30 minutes.  If directed, apply ice to the joint:  Put ice in a plastic bag.  Place a towel between your skin and the bag.  Leave the ice on for 20 minutes, 2-3 times per day.  Keep all follow-up visits as told by your health care provider. This is important. SEEK MEDICAL CARE IF:  The pain gets worse.  You have a fever. SEEK IMMEDIATE MEDICAL CARE IF:  You develop severe joint pain, swelling, or redness.  Many joints become painful and swollen.  You develop severe back pain.  You develop severe weakness in your leg.  You cannot control your bladder or bowels.   This information is not intended to replace advice given to you by your health care provider. Make sure you discuss any questions you have with your health care provider.   Document Released: 01/14/2005  Document Revised: 08/28/2015 Document Reviewed: 03/04/2015 Elsevier Interactive Patient Education [employer].

## 2015-11-20 NOTE — Progress Notes (Signed)
   Subjective:    I'm seeing this patient as a consultation for:  Dr Madilyn Fireman  CC: Left Knee Pain  HPI: Patient has a one-year history of moderate intermittent left knee pain. Symptoms of worsening of the past 10 days. She denies any injury. Patient has become very severe over the past 3 days. She is limping now. She has tried compression, icy hot, and multiple ibuprofen doses. The pain is present both medially and laterally. The pain will wake her from sleep occasionally. She denies any fevers chills nausea vomiting or diarrhea.  Past medical history, Surgical history, Family history not pertinant except as noted below, Social history, Allergies, and medications have been entered into the medical record, reviewed, and no changes needed.   Review of Systems: No headache, visual changes, nausea, vomiting, diarrhea, constipation, dizziness, abdominal pain, skin rash, fevers, chills, night sweats, weight loss, swollen lymph nodes, body aches, joint swelling, muscle aches, chest pain, shortness of breath, mood changes, visual or auditory hallucinations.   Objective:    Filed Vitals:   11/20/15 1625  BP: 130/84  Pulse: 72   General: Well Developed, well nourished, and in no acute distress.  Neuro/Psych: Alert and oriented x3, extra-ocular muscles intact, able to move all 4 extremities, sensation grossly intact. Skin: Warm and dry, no rashes noted.  Respiratory: Not using accessory muscles, speaking in full sentences, trachea midline.  Cardiovascular: Pulses palpable, no extremity edema. Abdomen: Does not appear distended. MSK: Left knee is relatively normal appearing without significant effusion. Range of motion 0-120 with 1+ retropatellar crepitations. Tender palpation at the lateral and medial joint lines. Positive McMurray's test. Negative anterior posterior drawer test.  Pain with valgus and varus stress however no laxity. Pain is present with the pivot hand and not on the tension side  of the knee. Capillary refill intact distally. Antalgic gait   Preliminary x-ray left knee shows significant DJD especially in the medial compartment. No obvious fractures. Awaiting formal radiology read.  Procedure: Real-time Ultrasound Guided Injection of Left Knee  Device: GE Logiq E  Images permanently stored and available for review in the ultrasound unit. Verbal informed consent obtained. Discussed risks and benefits of procedure. Warned about infection bleeding damage to structures skin hypopigmentation and fat atrophy among others. Patient expresses understanding and agreement Time-out conducted.  Noted no overlying erythema, induration, or other signs of local infection.  Skin prepped in a sterile fashion.  Local anesthesia: Topical Ethyl chloride.  With sterile technique and under real time ultrasound guidance: 80 milligrams Kenalog and 4 mL of Marcaine injected easily.  Completed without difficulty  Pain immediately resolved suggesting accurate placement of the medication.  Advised to call if fevers/chills, erythema, induration, drainage, or persistent bleeding.  Images permanently stored and available for review in the ultrasound unit.  Impression: Technically successful ultrasound guided injection.   No results found for this or any previous visit (from the past 24 hour(s)). No results found.  Impression and Recommendations:   This case required medical decision making of moderate complexity.

## 2015-11-21 NOTE — Progress Notes (Signed)
Quick Note:  Lots of arthritis present in both knees. ______

## 2015-12-06 ENCOUNTER — Encounter: Payer: Self-pay | Admitting: Family Medicine

## 2015-12-06 ENCOUNTER — Ambulatory Visit (INDEPENDENT_AMBULATORY_CARE_PROVIDER_SITE_OTHER): Payer: Managed Care, Other (non HMO) | Admitting: Family Medicine

## 2015-12-06 VITALS — BP 139/75 | HR 70 | Wt 196.0 lb

## 2015-12-06 DIAGNOSIS — M25562 Pain in left knee: Secondary | ICD-10-CM | POA: Diagnosis not present

## 2015-12-06 NOTE — Progress Notes (Signed)
       Yvonne Long is a 64 y.o. female who presents to [facility] today for follow-up left knee pain. Patient was seen on November 30 for left knee pain thought to be due to degenerative joint disease. She received a steroid injection which has helped considerably. She continues to note moderate medial left knee pain. She denies any radiating pain weakness or numbness fevers or chills. She is interested in Visco-supplementation.   Past Medical History  Diagnosis Date  . Diverticulum    Past Surgical History  Procedure Laterality Date  . Cystocele repair  2005    with bladder tack  . Abdominal hysterectomy  2005    Dr. Toy Cookey  . Bilateral salpingoophorectomy  2005    Dr. Noralee Stain  . Spine surgery  2/11    Lumbar Laminectomy-Dr. Owens Shark  . Cholecystectomy  2013   Social History  Substance Use Topics  . Smoking status: Former Smoker    Quit date: 12/21/1994  . Smokeless tobacco: Not on file  . Alcohol Use: Yes     Comment: 1 alcoholic drink per day   family history includes Alcohol abuse in her father; Cancer in her other; Depression in her father and son; Heart attack in her other; Hypertension in her mother; Peripheral vascular disease in her father; Stroke in her other; Thrombosis in her father.  ROS as above Medications: Current Outpatient Prescriptions  Medication Sig Dispense Refill  . acitretin (SORIATANE) 25 MG capsule 1 CAPSULE BY MOUTH DAILY TAKE ONE TAB DAILY  6  . aspirin 81 MG tablet Take 81 mg by mouth daily.    . Biotin 1000 MCG tablet Take 1,000 mcg by mouth 3 (three) times daily.    . Calcium Carbonate-Vitamin D (CALTRATE 600+D) 600-400 MG-UNIT per tablet Take 1 tablet by mouth daily.      . Omega-3 Fatty Acids (FISH OIL PO) Take 1 capsule by mouth daily.      Marland Kitchen PARoxetine (PAXIL) 20 MG tablet Take 1 tablet (20 mg total) by mouth daily. TAKE ONE TABLET  BY MOUTH EVERY DAY 90 tablet 1  . phentermine (ADIPEX-P) 37.5 MG tablet TAKE 1/2 TABLT EVERY MORNING 30 MINUTES BEFORE BREAKFAST  1  . topiramate (TOPAMAX) 50 MG tablet TAKE ONE TABLET (50 MG TOTAL) BY MOUTH DAILY.  2  . VITAMIN D, CHOLECALCIFEROL, PO Take by mouth.     No current facility-administered medications for this visit.   Allergies  Allergen Reactions  . Codeine      Exam:  BP 139/75 mmHg  Pulse 70  Wt 196 lb (88.905 kg) Gen: Well NAD Left knee: Minimal effusion. Tender palpation medial joint line normal range of motion.   X-ray left knee dated November 30 reviewed   No results found for this or any previous visit (from the past 24 hour(s)). No results found.   Please see individual assessment and plan sections.

## 2015-12-06 NOTE — Patient Instructions (Signed)
Thank you for coming in today. We will investigate visco-supplimentation.  Expect a call today or Monday.  Use bodyhelix knee sleeve.   Sodium Hyaluronate intra-articular injection What is this medicine? SODIUM HYALURONATE (SOE dee um hye al yoor ON ate) is used to treat pain in the knee due to osteoarthritis. This medicine may be used for other purposes; ask your health care provider or pharmacist if you have questions. What should I tell my health care provider before I take this medicine? They need to know if you have any of these conditions: -bleeding disorders -glaucoma -infection in the knee joint -skin conditions or sensitivity -skin infection -an unusual allergic reaction to sodium hyaluronate, other medicines, foods, dyes, or preservatives. Different brands of sodium hyaluronate contain different allergens. Some may contain egg. Talk to your doctor about your allergies to make sure that you get the right product. -pregnant or trying to get pregnant -breast-feeding How should I use this medicine? This medicine is for injection into the knee joint. It is given by a health care professional in a hospital or clinic setting. Talk to your pediatrician regarding the use of this medicine in children. Special care may be needed. Overdosage: If you think you have taken too much of this medicine contact a poison control center or emergency room at once. NOTE: This medicine is only for you. Do not share this medicine with others. What if I miss a dose? This does not apply. What may interact with this medicine? Interactions are not expected. This list may not describe all possible interactions. Give your health care provider a list of all the medicines, herbs, non-prescription drugs, or dietary supplements you use. Also tell them if you smoke, drink alcohol, or use illegal drugs. Some items may interact with your medicine. What should I watch for while using this medicine? Tell your doctor  or healthcare professional if your symptoms do not start to get better or if they get worse. If receiving this medicine for osteoarthritis, limit your activity after you receive your injection. Avoid physical activity for 48 hours following your injection to keep your knee from swelling. Do not stand on your feet for more than 1 hour at a time during the first 48 hours following your injection. Ask your doctor or healthcare professional about when you can begin major physical activity again. What side effects may I notice from receiving this medicine? Side effects that you should report to your doctor or health care professional as soon as possible: -allergic reactions like skin rash, itching or hives, swelling of the face, lips, or tongue -dizziness -facial flushing -pain, tingling, numbness in the hands or feet -vision changes if received this medicine during eye surgery Side effects that usually do not require medical attention (Report these to your doctor or health care professional if they continue or are bothersome.): -back pain -bruising at site where injected -chills -diarrhea -fever -headache -joint pain -joint stiffness -joint swelling -muscle cramps -muscle pain -nausea, vomiting -pain, redness, or irritation at site where injected -weak or tired This list may not describe all possible side effects. Call your doctor for medical advice about side effects. You may report side effects to FDA at 1-800-FDA-1088. Where should I keep my medicine? This drug is given in a hospital or clinic and will not be stored at home. NOTE: This sheet is a summary. It may not cover all possible information. If you have questions about this medicine, talk to your doctor, pharmacist, or health care provider.  2016, Elsevier/Gold Standard. (2015-01-15 11:10:34)

## 2015-12-06 NOTE — Assessment & Plan Note (Signed)
Proceed to  Visco-supplementation injections. We'll attempt to prior authorize and patient will return for injections in the near future.

## 2015-12-10 NOTE — Progress Notes (Signed)
Submitted for approval on Orthovisc. Awaiting confirmation.  

## 2015-12-10 NOTE — Progress Notes (Signed)
Called and informed pt that we are waiting to hear back from insurance for auth and that we will be in touch soon.

## 2015-12-12 ENCOUNTER — Ambulatory Visit (INDEPENDENT_AMBULATORY_CARE_PROVIDER_SITE_OTHER): Payer: Managed Care, Other (non HMO) | Admitting: Family Medicine

## 2015-12-12 ENCOUNTER — Telehealth: Payer: Self-pay | Admitting: Family Medicine

## 2015-12-12 ENCOUNTER — Encounter: Payer: Self-pay | Admitting: Family Medicine

## 2015-12-12 VITALS — BP 142/61 | HR 70

## 2015-12-12 DIAGNOSIS — M25562 Pain in left knee: Secondary | ICD-10-CM | POA: Diagnosis not present

## 2015-12-12 MED ORDER — TRAMADOL HCL 50 MG PO TABS: 50.0000 mg | ORAL_TABLET | Freq: Three times a day (TID) | ORAL | Status: DC | PRN

## 2015-12-12 NOTE — Patient Instructions (Signed)
Thank you for coming in today. Take tramadol for severe pain.  Return in 1 week for 2nd injection.   Call or go to the ER if you develop a large red swollen joint with extreme pain or oozing puss.

## 2015-12-12 NOTE — Telephone Encounter (Signed)
Received the following information via [employer] Benefit investigation:  Plan renews on 12/22/15. Benefits for dates of service after 12/22/15 may vary. We recommend you resubmit for dates of service after 12/22/15. 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 The effective date is 09/20/2013. J-3354 is covered at 100% of the contracted rate when administered in an office setting. 20610 is covered at 100% of the contracted rate when performed in an office setting. Deductible does not apply. The specialist office copay is $30 if billed. Once out of pocket is met coverage will be 100% and copay waived. Pre-certification is required can be obtained by calling [phone]. *If office visit is not billed [phone] is covered at 90% of the contracted rate once deductible is met. 20610 is covered at 90% of the contracted rate once deductible is met. Deductible is $300 and has been met.* The reference number is [id number].  [facility], spoke with [employer]. Received authorization for Provider to buy and bill. Auth #: [id number]. Valid dates: 12/12/15-03/11/16.   Patient advised of authorization and transferred to scheduling for appts to be made.

## 2015-12-12 NOTE — Progress Notes (Signed)
Patient presents to clinic today for previously arranged visco-supplementation injection. This is her first in a series of 4.  Procedure: Real-time Ultrasound Guided Injection of Orthovisc 1/4 Device: GE Logiq E  Images permanently stored and available for review in the ultrasound unit. Verbal informed consent obtained. Discussed risks and benefits of procedure. Warned about infection bleeding damage to structures skin hypopigmentation and fat atrophy among others. Patient expresses understanding and agreement Time-out conducted.  Noted no overlying erythema, induration, or other signs of local infection.  Skin prepped in a sterile fashion.  Local anesthesia: Topical Ethyl chloride.  With sterile technique and under real time ultrasound guidance: Orthovisc injected easily.  Completed without difficulty  Pain immediately resolved suggesting accurate placement of the medication.  Advised to call if fevers/chills, erythema, induration, drainage, or persistent bleeding.  Images permanently stored and available for review in the ultrasound unit.  Impression: Technically successful ultrasound guided injection.     Patient was given a prescription of tramadol for pain relief. Return in one week.

## 2015-12-19 ENCOUNTER — Ambulatory Visit (INDEPENDENT_AMBULATORY_CARE_PROVIDER_SITE_OTHER): Payer: Managed Care, Other (non HMO) | Admitting: Family Medicine

## 2015-12-19 ENCOUNTER — Encounter: Payer: Self-pay | Admitting: Family Medicine

## 2015-12-19 VITALS — BP 132/92 | HR 80 | Wt 193.0 lb

## 2015-12-19 DIAGNOSIS — M25562 Pain in left knee: Secondary | ICD-10-CM | POA: Diagnosis not present

## 2015-12-19 NOTE — Progress Notes (Signed)
Patient presents to clinic today for previously arranged Orthovisc injection. This is 2 of a series of 4.  Procedure: Real-time Ultrasound Guided Injection of left knee  Device: GE Logiq E  Images permanently stored and available for review in the ultrasound unit. Verbal informed consent obtained. Discussed risks and benefits of procedure. Warned about infection bleeding damage to structures skin hypopigmentation and fat atrophy among others. Patient expresses understanding and agreement Time-out conducted.  Noted no overlying erythema, induration, or other signs of local infection.  Skin prepped in a sterile fashion.  Local anesthesia: Topical Ethyl chloride.  With sterile technique and under real time ultrasound guidance: orthovisc injected easily.  Completed without difficulty  Pain immediately resolved suggesting accurate placement of the medication.  Advised to call if fevers/chills, erythema, induration, drainage, or persistent bleeding.  Images permanently stored and available for review in the ultrasound unit.  Impression: Technically successful ultrasound guided injection.  Return in 1 week for Orthovisc 3/4

## 2015-12-26 ENCOUNTER — Encounter: Payer: Self-pay | Admitting: Family Medicine

## 2015-12-26 ENCOUNTER — Ambulatory Visit: Payer: Managed Care, Other (non HMO) | Admitting: Osteopathic Medicine

## 2015-12-26 ENCOUNTER — Ambulatory Visit (INDEPENDENT_AMBULATORY_CARE_PROVIDER_SITE_OTHER): Payer: Managed Care, Other (non HMO) | Admitting: Family Medicine

## 2015-12-26 VITALS — BP 124/61 | HR 74 | Wt 198.0 lb

## 2015-12-26 DIAGNOSIS — M25562 Pain in left knee: Secondary | ICD-10-CM

## 2015-12-26 NOTE — Progress Notes (Signed)
  Patient presents to clinic today for previously arranged Orthovisc injection. This is 3 of a series of 4.  Procedure: Real-time Ultrasound Guided Injection of left knee  Device: GE Logiq E  Images permanently stored and available for review in the ultrasound unit. Verbal informed consent obtained. Discussed risks and benefits of procedure. Warned about infection bleeding damage to structures skin hypopigmentation and fat atrophy among others. Patient expresses understanding and agreement Time-out conducted.  Noted no overlying erythema, induration, or other signs of local infection.  Skin prepped in a sterile fashion.  Local anesthesia: Topical Ethyl chloride.  With sterile technique and under real time ultrasound guidance: orthovisc injected easily.  Completed without difficulty  Pain immediately resolved suggesting accurate placement of the medication.  Advised to call if fevers/chills, erythema, induration, drainage, or persistent bleeding.  Images permanently stored and available for review in the ultrasound unit.  Impression: Technically successful ultrasound guided injection.  Return in 1 week for Orthovisc 4/4

## 2016-01-02 ENCOUNTER — Encounter: Payer: Self-pay | Admitting: Family Medicine

## 2016-01-02 ENCOUNTER — Ambulatory Visit (INDEPENDENT_AMBULATORY_CARE_PROVIDER_SITE_OTHER): Payer: Managed Care, Other (non HMO) | Admitting: Family Medicine

## 2016-01-02 VITALS — BP 148/73 | HR 71 | Wt 195.0 lb

## 2016-01-02 DIAGNOSIS — M25562 Pain in left knee: Secondary | ICD-10-CM

## 2016-01-02 NOTE — Progress Notes (Signed)
   Patient presents to clinic today for previously arranged Orthovisc injection. This is 4 of a series of 4.  Procedure: Real-time Ultrasound Guided Injection of left knee  Device: GE Logiq E  Images permanently stored and available for review in the ultrasound unit. Verbal informed consent obtained. Discussed risks and benefits of procedure. Warned about infection bleeding damage to structures skin hypopigmentation and fat atrophy among others. Patient expresses understanding and agreement Time-out conducted.  Noted no overlying erythema, induration, or other signs of local infection.  Skin prepped in a sterile fashion.  Local anesthesia: Topical Ethyl chloride.  With sterile technique and under real time ultrasound guidance: orthovisc injected easily.  Completed without difficulty  Pain immediately resolved suggesting accurate placement of the medication.  Advised to call if fevers/chills, erythema, induration, drainage, or persistent bleeding.  Images permanently stored and available for review in the ultrasound unit.  Impression: Technically successful ultrasound guided injection.

## 2016-02-12 ENCOUNTER — Ambulatory Visit (INDEPENDENT_AMBULATORY_CARE_PROVIDER_SITE_OTHER): Payer: Managed Care, Other (non HMO) | Admitting: Family Medicine

## 2016-02-12 ENCOUNTER — Ambulatory Visit (INDEPENDENT_AMBULATORY_CARE_PROVIDER_SITE_OTHER): Payer: Managed Care, Other (non HMO)

## 2016-02-12 ENCOUNTER — Encounter: Payer: Self-pay | Admitting: Family Medicine

## 2016-02-12 VITALS — BP 140/65 | HR 65 | Wt 200.0 lb

## 2016-02-12 DIAGNOSIS — M546 Pain in thoracic spine: Secondary | ICD-10-CM | POA: Insufficient documentation

## 2016-02-12 DIAGNOSIS — R0781 Pleurodynia: Secondary | ICD-10-CM

## 2016-02-12 HISTORY — DX: Pain in thoracic spine: M54.6

## 2016-02-12 MED ORDER — TRAMADOL HCL 50 MG PO TABS: 50.0000 mg | ORAL_TABLET | Freq: Three times a day (TID) | ORAL | Status: DC | PRN

## 2016-02-12 MED ORDER — CYCLOBENZAPRINE HCL 10 MG PO TABS: 10.0000 mg | ORAL_TABLET | Freq: Three times a day (TID) | ORAL | Status: DC | PRN

## 2016-02-12 NOTE — Progress Notes (Signed)
   Subjective:    I'm seeing this patient as a consultation for:  Dr Madilyn Fireman  CC: Back pain  HPI: Patient has a one month history of right-sided thoracic back pain. He became dramatically worse about 2 days ago. She was using a TENS unit and heating pad which was helping and all of a sudden 2 days ago had severe cramping in her right side of her thoracic back. The pain has been severe since. She notes pain with deep breathing and with motion. She denies any radiating pain weakness or numbness. She denies any rash. She has tried TENS unit heat and ice and ibuprofen which has helped a bit. She denies any fevers chills cough congestion vomiting or diarrhea.  Past medical history, Surgical history, Family history not pertinant except as noted below, Social history, Allergies, and medications have been entered into the medical record, reviewed, and no changes needed.   Review of Systems: No headache, visual changes, nausea, vomiting, diarrhea, constipation, dizziness, abdominal pain, skin rash, fevers, chills, night sweats, weight loss, swollen lymph nodes, body aches, joint swelling, muscle aches, chest pain, shortness of breath, mood changes, visual or auditory hallucinations.   Objective:    Filed Vitals:   02/12/16 1502  BP: 140/65  Pulse: 65   General: Well Developed, well nourished, and in no acute distress.  Neuro/Psych: Alert and oriented x3, extra-ocular muscles intact, able to move all 4 extremities, sensation grossly intact. Skin: Warm and dry, no rashes noted.  Respiratory: Not using accessory muscles, speaking in full sentences, trachea midline.  Cardiovascular: Pulses palpable, no extremity edema. Abdomen: Does not appear distended. MSK: Right side mid thoracic back is tender to touch along the right thoracic paraspinals read she is nontender along the spinal midline. Normal chest expansion bilaterally. No skin changes or rash.  No results found for this or any previous visit  (from the past 24 hour(s)). No results found.  Impression and Recommendations:   This case required medical decision making of moderate complexity.

## 2016-02-12 NOTE — Assessment & Plan Note (Signed)
Likely myofascial pain or spasms. Chest x-ray and rib x-ray pending. Refer to physical therapy. Treat with ibuprofen and Flexeril and tramadol. Continue heat and home exercises. Recheck in 3 weeks.

## 2016-02-12 NOTE — Patient Instructions (Signed)
Thank you for coming in today. Get xray today.  Attend PT.  Take ibuprofen. Use flexeril and tramadol as needed.  Call or go to the emergency room if you get worse, have trouble breathing, have chest pains, or palpitations.  Come back or go to the emergency room if you notice new weakness new numbness problems walking or bowel or bladder problems.

## 2016-02-13 NOTE — Progress Notes (Signed)
Quick Note:  No fracture or lung problem seen. ______

## 2016-02-20 ENCOUNTER — Ambulatory Visit: Payer: Managed Care, Other (non HMO) | Admitting: Rehabilitative and Restorative Service Providers"

## 2016-02-20 LAB — CBC, NO DIFFERENTIAL/PLATELET
MCH: 29.5
MCHC: 33.6
MCV: 88
RBC: 4.51
RDW: 14.1

## 2016-02-20 LAB — CBC AND DIFFERENTIAL
HCT: 40 % (ref 36–46)
Hemoglobin: 13.3 g/dL (ref 12.0–16.0)
Platelets: 276 10*3/uL (ref 150–399)
WBC: 8.8 10^3/mL

## 2016-02-25 ENCOUNTER — Encounter: Payer: Self-pay | Admitting: Family Medicine

## 2016-02-25 ENCOUNTER — Telehealth: Payer: Self-pay | Admitting: Family Medicine

## 2016-02-25 NOTE — Telephone Encounter (Signed)
Call pt: blood count is normal.

## 2016-02-25 NOTE — Telephone Encounter (Signed)
Pt advised of results, verbalized understanding. No further questions.  

## 2016-03-11 ENCOUNTER — Ambulatory Visit (INDEPENDENT_AMBULATORY_CARE_PROVIDER_SITE_OTHER): Payer: Managed Care, Other (non HMO) | Admitting: Family Medicine

## 2016-03-11 ENCOUNTER — Encounter: Payer: Self-pay | Admitting: Family Medicine

## 2016-03-11 VITALS — BP 149/85 | HR 75 | Wt 198.0 lb

## 2016-03-11 DIAGNOSIS — E785 Hyperlipidemia, unspecified: Secondary | ICD-10-CM

## 2016-03-11 DIAGNOSIS — M25562 Pain in left knee: Secondary | ICD-10-CM | POA: Diagnosis not present

## 2016-03-11 NOTE — Assessment & Plan Note (Signed)
Obtain fasting lipid panel and basic metabolic panel for health screening form.

## 2016-03-11 NOTE — Assessment & Plan Note (Signed)
Patient has failed conservative management. Refer to orthopedic surgery for consultation regarding knee replacement surgery.

## 2016-03-11 NOTE — Progress Notes (Signed)
       Mava Biber is a 65 y.o. female who presents to [facility] today for follow-up left knee pain. Patient has been managing left knee pain with a series of steroid injections and a trial of Orthovisc. She notes following the Orthovisc series completion on 01/02/2016 her pain is improved a little but still is quite bothersome. She's not able to do the things she likes daily play golf and go for walks without pain.  Additionally patient notes she has a work health screening form that needs completion.   Past Medical History  Diagnosis Date  . Diverticulum    Past Surgical History  Procedure Laterality Date  . Cystocele repair  2005    with bladder tack  . Abdominal hysterectomy  2005    Dr. Toy Cookey  . Bilateral salpingoophorectomy  2005    Dr. Noralee Stain  . Spine surgery  2/11    Lumbar Laminectomy-Dr. Owens Shark  . Cholecystectomy  2013   Social History  Substance Use Topics  . Smoking status: Former Smoker    Quit date: 12/21/1994  . Smokeless tobacco: Not on file  . Alcohol Use: Yes     Comment: 1 alcoholic drink per day   family history includes Alcohol abuse in her father; Cancer in her other; Depression in her father and son; Heart attack in her other; Hypertension in her mother; Peripheral vascular disease in her father; Stroke in her other; Thrombosis in her father.  ROS as above Medications: Current Outpatient Prescriptions  Medication Sig Dispense Refill  . acitretin (SORIATANE) 25 MG capsule 1 CAPSULE BY MOUTH DAILY TAKE ONE TAB DAILY  6  . aspirin 81 MG tablet Take 81 mg by mouth daily.    . Biotin 1000 MCG tablet Take 1,000 mcg by mouth 3 (three) times daily.    . Calcium Carbonate-Vitamin D (CALTRATE 600+D) 600-400 MG-UNIT per tablet Take 1 tablet by mouth daily.      . cyclobenzaprine (FLEXERIL) 10 MG tablet Take 1 tablet (10 mg total) by mouth 3 (three)  times daily as needed for muscle spasms. 30 tablet 0  . Multiple Vitamin (MULTIVITAMIN) capsule Take 1 capsule by mouth daily.    . Omega-3 Fatty Acids (FISH OIL PO) Take 1 capsule by mouth daily.      Marland Kitchen PARoxetine (PAXIL) 20 MG tablet Take 1 tablet (20 mg total) by mouth daily. TAKE ONE TABLET BY MOUTH EVERY DAY 90 tablet 1  . VITAMIN D, CHOLECALCIFEROL, PO Take by mouth.    Marland Kitchen VITAMIN K PO Take by mouth.     No current facility-administered medications for this visit.   Allergies  Allergen Reactions  . Codeine      Exam:  BP 149/85 mmHg  Pulse 75  Wt 198 lb (89.812 kg) Gen: Well NAD Knee normal motion stable ligamentous exam  X-ray left knee dated 11/20/2015 showing moderate DJD with occasional loose bodies.  No results found for this or any previous visit (from the past 24 hour(s)). No results found.   Please see individual assessment and plan sections.

## 2016-03-11 NOTE — Patient Instructions (Signed)
Thank you for coming in today.  You should get a call from [facility] about a knee replacement surgery.  Return as needed.  Get fasting labs as part of your [facility] Screening form.   Total Knee Replacement Total knee replacement is a procedure to replace your knee joint with an artificial knee joint (prosthetic knee joint). The purpose of this surgery is to reduce pain and improve your knee function. LET [facility] CARE PROVIDER KNOW ABOUT:   Any allergies you have.  All medicines you are taking, including vitamins, herbs, eye drops, creams, and over-the-counter medicines.  Previous problems you or members of your family have had with the use of anesthetics.  Any blood disorders you have.  Previous surgeries you have had.  Medical conditions you have. RISKS AND COMPLICATIONS  Generally, total knee replacement is a safe procedure. However, problems can occur, including:  Loss of range of motion of the knee or instability of the knee.  Loosening of the prosthesis.  Infection.  Persistent pain. BEFORE THE PROCEDURE   Plan to have someone take you home after the procedure.  Do not eat or drink anything after midnight on the night before the procedure or as directed by your health care provider.  Ask your health care provider about:  Changing or stopping your regular medicines. This is especially important if you are taking diabetes medicines or blood thinners.  Taking medicines such as aspirin and ibuprofen. These medicines can thin your blood. Do not take these medicines before your procedure if your health care provider asks you not to.  Ask your health care provider about how your surgical site will be marked or identified.  You may be given antibiotic medicines to help prevent infection. PROCEDURE   To reduce your risk of infection:  Your health care team will wash or sanitize their hands.  Your skin will be washed with soap.  An IV tube will be  inserted into one of your veins. You will be given one or more of the following:  A medicine that makes you drowsy (sedative).  A medicine that makes you fall asleep (general anesthetic).  A medicine injected into your spine that numbs your body below the waist (spinal anesthetic).  A medicine to block feeling in your leg (nerve block) to help ease pain after surgery.  An incision will be made in your knee. Your surgeon will take out any damaged cartilage and bone by sawing off the damaged surfaces.  The surgeon will then put a new metal liner over the sawed-off portion of your thigh bone (femur) and a plastic liner over the sawed-off portion of one of the bones of your lower leg (tibia). This is to restore alignment and function to your knee. A plastic piece is often used to restore the surface of your knee cap. AFTER THE PROCEDURE   You will stay in a recovery area until your medicines wear off.  You may have drainage tubes to drain excess fluid from your knee. These tubes attach to a device that removes these fluids.  Once you are awake, stable, and taking fluids well, you will be taken to your hospital room.  You may be directed to take actions to help prevent blood clots. These may include:  Walking shortly after surgery, with someone assisting you. Moving around after surgery helps to improve blood flow.  Wearing compression stockings or using different types of devices.  You will receive physical therapy as prescribed by your health  care provider.   This information is not intended to replace advice given to you by your health care provider. Make sure you discuss any questions you have with your health care provider.   Document Released: 03/15/2001 Document Revised: 08/28/2015 Document Reviewed: 01/17/2012 Elsevier Interactive Patient Education [employer].

## 2016-03-19 ENCOUNTER — Ambulatory Visit (INDEPENDENT_AMBULATORY_CARE_PROVIDER_SITE_OTHER): Payer: Managed Care, Other (non HMO) | Admitting: Family Medicine

## 2016-03-19 ENCOUNTER — Encounter: Payer: Self-pay | Admitting: Family Medicine

## 2016-03-19 VITALS — BP 134/68 | HR 66 | Wt 200.0 lb

## 2016-03-19 DIAGNOSIS — N183 Chronic kidney disease, stage 3 (moderate): Secondary | ICD-10-CM | POA: Diagnosis not present

## 2016-03-19 DIAGNOSIS — F330 Major depressive disorder, recurrent, mild: Secondary | ICD-10-CM | POA: Diagnosis not present

## 2016-03-19 DIAGNOSIS — N1832 Chronic kidney disease, stage 3b: Secondary | ICD-10-CM

## 2016-03-19 DIAGNOSIS — Z1159 Encounter for screening for other viral diseases: Secondary | ICD-10-CM | POA: Diagnosis not present

## 2016-03-19 HISTORY — DX: Chronic kidney disease, stage 3b: N18.32

## 2016-03-19 NOTE — Progress Notes (Signed)
   Subjective:    Patient ID: Yvonne Long, female    DOB: 10/17/1951, 65 y.o.   MRN: [id number]  HPI Follow-up major depression disorder. I last saw her in July 2016. She does complain of little pleasure in interest in doing things several days of the week. She also complains of low energy and appetite changes. She is currently on paroxetine 20 mg daily.   She's not been able to exercise much because of her left knee. She's been working with Dr. Georgina Snell on this. Eventually she is going to need knee replacement.  Review of Systems     Objective:   Physical Exam  Constitutional: She is oriented to person, place, and time. She appears well-developed and well-nourished.  HENT:  Head: Normocephalic and atraumatic.  Cardiovascular: Normal rate, regular rhythm and normal heart sounds.   Pulmonary/Chest: Effort normal and breath sounds normal.  Neurological: She is alert and oriented to person, place, and time.  Skin: Skin is warm and dry.  Psychiatric: She has a normal mood and affect. Her behavior is normal.          Assessment & Plan:  Depression-overall well controlled. PHQ 9 score of 3 today. Well-controlled. Continue current regimen. Follow-up in 6 months. I did encourage her to start some type of exercise routine. Because she has knee problems swimming may be a good option for her.  CKD 3-due to recheck kidney function. We'll monitor this every 6 months. Due to recheck function today.   Assessment need for colon cancer screening. Her last colonoscopy he was in 2006. Discussed Cologuard as an option. She will call us back and let us know if she prefers to do.  Recommended hepatitis C screening. Patient agrees.  Form completed for biometric screening for work.

## 2016-03-20 ENCOUNTER — Ambulatory Visit: Payer: Managed Care, Other (non HMO) | Admitting: Family Medicine

## 2016-03-20 LAB — COMPLETE METABOLIC PANEL WITH GFR
ALT: 26 U/L (ref 6–29)
AST: 19 U/L (ref 10–35)
Albumin: 4.2 g/dL (ref 3.6–5.1)
Alkaline Phosphatase: 62 U/L (ref 33–130)
BUN: 17 mg/dL (ref 7–25)
CO2: 26 mmol/L (ref 20–31)
Calcium: 9.3 mg/dL (ref 8.6–10.4)
Chloride: 101 mmol/L (ref 98–110)
Creat: 0.75 mg/dL (ref 0.50–0.99)
GFR, Est African American: >89 mL/min (ref >=60)
GFR, Est Non African American: 85 mL/min (ref >=60)
Glucose, Bld: 110 mg/dL — ABNORMAL HIGH (ref 65–99)
Potassium: 4.7 mmol/L (ref 3.5–5.3)
Sodium: 139 mmol/L (ref 135–146)
Total Bilirubin: 0.3 mg/dL (ref 0.2–1.2)
Total Protein: 6.6 g/dL (ref 6.1–8.1)

## 2016-03-20 LAB — HEPATITIS C ANTIBODY: HCV Ab: NEGATIVE

## 2016-04-14 LAB — COLOGUARD: Cologuard: NEGATIVE

## 2016-04-16 ENCOUNTER — Encounter: Payer: Self-pay | Admitting: Family Medicine

## 2016-06-18 ENCOUNTER — Other Ambulatory Visit: Payer: Self-pay | Admitting: Family Medicine

## 2016-12-03 ENCOUNTER — Telehealth: Payer: Self-pay | Admitting: *Deleted

## 2016-12-18 NOTE — Telephone Encounter (Signed)
Letter written and faxed for pt to give to insurance company.Yvonne Long Yvonne Long

## 2017-04-27 ENCOUNTER — Ambulatory Visit (INDEPENDENT_AMBULATORY_CARE_PROVIDER_SITE_OTHER): Payer: Self-pay | Admitting: Family Medicine

## 2017-04-27 VITALS — BP 164/72 | HR 69 | Temp 100.4°F | Wt 213.0 lb

## 2017-04-27 DIAGNOSIS — J329 Chronic sinusitis, unspecified: Secondary | ICD-10-CM

## 2017-04-27 DIAGNOSIS — R062 Wheezing: Secondary | ICD-10-CM

## 2017-04-27 DIAGNOSIS — J40 Bronchitis, not specified as acute or chronic: Secondary | ICD-10-CM

## 2017-04-27 MED ORDER — AZITHROMYCIN 250 MG PO TABS: ORAL_TABLET | ORAL | 0 refills | Status: AC

## 2017-04-27 MED ORDER — ALBUTEROL SULFATE HFA 108 (90 BASE) MCG/ACT IN AERS: 2.0000 | INHALATION_SPRAY | Freq: Four times a day (QID) | RESPIRATORY_TRACT | 2 refills | Status: DC | PRN

## 2017-04-27 MED ORDER — ALBUTEROL SULFATE (2.5 MG/3ML) 0.083% IN NEBU
2.5000 mg | INHALATION_SOLUTION | Freq: Once | RESPIRATORY_TRACT | Status: AC
  Administered 2017-04-27: 2.5 mg via RESPIRATORY_TRACT

## 2017-04-27 NOTE — Progress Notes (Signed)
Subjective:    Patient ID: Yvonne Long, female    DOB: Jan 10, 1951, 66 y.o.   MRN: [id number]  HPI 66 year old female with no prior history of lung disease comes in today with 4 days of cough, congestion, wheezing, nasal congestion and sore throat. She has had a fever up to 100 at home. She's been using mostly Tylenol.   Review of Systems   BP (!) 164/72   Pulse 69   Temp (!) 100.4 F (38 C) (Oral)   Wt 213 lb (96.6 kg)   SpO2 98%   PF 350 L/min Comment: post peak flow  BMI 34.38 kg/m     Allergies  Allergen Reactions  . Codeine     Past Medical History:  Diagnosis Date  . Diverticulum     Past Surgical History:  Procedure Laterality Date  . ABDOMINAL HYSTERECTOMY  2005   Dr. Toy Cookey  . BILATERAL SALPINGOOPHORECTOMY  2005   Dr. Noralee Stain  . CHOLECYSTECTOMY  2013  . CYSTOCELE REPAIR  2005   with bladder tack  . SPINE SURGERY  2/11   Lumbar Laminectomy-Dr. Owens Shark    Social History   Social History  . Marital status: Married    Spouse name: Darnell Level  . Number of children: 2  . Years of education: N/A   Occupational History  . [employer]  . dog grommer    Social History Main Topics  . Smoking status: Former Smoker    Quit date: 12/21/1994  . Smokeless tobacco: Not on file  . Alcohol use Yes     Comment: 1 alcoholic drink per day  . Drug use: No  . Sexual activity: Not on file   Other Topics Concern  . Not on file   Social History Narrative   1 caffeinated drink/day.  Enjoys yard work and yoga    Family History  Problem Relation Age of Onset  . Hypertension Mother   . Alcohol abuse Father   . Depression Father   . Peripheral vascular disease Father   . Thrombosis Father     deceased from thrombosis of leg.  . Cancer Other     Brain cancer  . Stroke Other   . Heart attack Other   . Depression Son     Outpatient Encounter Prescriptions as of 04/27/2017  Medication Sig  . acitretin (SORIATANE) 25 MG capsule  1 CAPSULE BY MOUTH DAILY TAKE ONE TAB DAILY  . aspirin 81 MG tablet Take 81 mg by mouth daily.  . Biotin 1000 MCG tablet Take 1,000 mcg by mouth 3 (three) times daily.  . Calcium Carbonate-Vitamin D (CALTRATE 600+D) 600-400 MG-UNIT per tablet Take 1 tablet by mouth daily.    . cyclobenzaprine (FLEXERIL) 10 MG tablet Take 1 tablet (10 mg total) by mouth 3 (three) times daily as needed for muscle spasms.  . Multiple Vitamin (MULTIVITAMIN) capsule Take 1 capsule by mouth daily.  . Omega-3 Fatty Acids (FISH OIL PO) Take 1 capsule by mouth daily.    Marland Kitchen PARoxetine (PAXIL) 20 MG tablet TAKE 1 TABLET DAILY  . VITAMIN D, CHOLECALCIFEROL, PO Take by mouth.  Marland Kitchen VITAMIN K PO Take by mouth.  Marland Kitchen albuterol (PROVENTIL HFA;VENTOLIN HFA) 108 (90 Base) MCG/ACT inhaler Inhale 2-4 puffs into the lungs every 6 (six) hours as needed for wheezing or shortness of breath.  Marland Kitchen azithromycin (ZITHROMAX) 250 MG tablet 2 Ttabs PO on Day 1, then one a day x 4 days.   Facility-Administered Encounter Medications as  of 04/27/2017  Medication  . albuterol (PROVENTIL) (2.5 MG/3ML) 0.083% nebulizer solution 2.5 mg           Objective:   Physical Exam  Constitutional: She is oriented to person, place, and time. She appears well-developed and well-nourished.  HENT:  Head: Normocephalic and atraumatic.  Right Ear: External ear normal.  Left Ear: External ear normal.  Nose: Nose normal.  Mouth/Throat: Oropharynx is clear and moist.  TMs and canals are clear.   Eyes: Conjunctivae and EOM are normal. Pupils are equal, round, and reactive to light.  Neck: Neck supple. No thyromegaly present.  Cardiovascular: Normal rate, regular rhythm and normal heart sounds.   Pulmonary/Chest: Effort normal. She has wheezes.  WHEEZE/squeak AT THE LEFT LOWER LUNG AND BILAT ANT LUNG FIELDS.   Lymphadenopathy:    She has no cervical adenopathy.  Neurological: She is alert and oriented to person, place, and time.  Skin: Skin is warm and dry.   Psychiatric: She has a normal mood and affect.        Assessment & Plan:  Sinobronchitis with wheezing- we gave her an albuterol neb he lies her treatment here in the office. She felt significantly better afterwards and had a significant improvement in her peak flows. I wasn't her lungs again after the treatment and they were completely clear. We'll send a prescription for albuterol to use as needed.encouraged her to have the pharmacist show her how to properly use the device. Also will treat with azithromycin. The explained that this could still be viral.

## 2017-05-11 ENCOUNTER — Other Ambulatory Visit: Payer: Self-pay | Admitting: Family Medicine

## 2017-08-09 ENCOUNTER — Other Ambulatory Visit: Payer: Self-pay | Admitting: Family Medicine

## 2017-08-17 ENCOUNTER — Telehealth: Payer: Self-pay | Admitting: *Deleted

## 2017-08-17 NOTE — Telephone Encounter (Signed)
Called pt and lvm informing her that her medication was denied due to not being seen for this in over 50yr. She has been in for an acute issue but not for this particular medication and she will need to schedule an appt for this. Advised that she call back to schedule.Yvonne Long [place]

## 2017-08-27 ENCOUNTER — Ambulatory Visit (INDEPENDENT_AMBULATORY_CARE_PROVIDER_SITE_OTHER): Payer: Commercial Managed Care - PPO | Admitting: Family Medicine

## 2017-08-27 VITALS — BP 133/54 | HR 75 | Ht 66.0 in | Wt 216.0 lb

## 2017-08-27 DIAGNOSIS — Z1231 Encounter for screening mammogram for malignant neoplasm of breast: Secondary | ICD-10-CM | POA: Diagnosis not present

## 2017-08-27 DIAGNOSIS — E7800 Pure hypercholesterolemia, unspecified: Secondary | ICD-10-CM | POA: Diagnosis not present

## 2017-08-27 DIAGNOSIS — F330 Major depressive disorder, recurrent, mild: Secondary | ICD-10-CM | POA: Diagnosis not present

## 2017-08-27 DIAGNOSIS — N183 Chronic kidney disease, stage 3 (moderate): Secondary | ICD-10-CM | POA: Diagnosis not present

## 2017-08-27 DIAGNOSIS — N1832 Chronic kidney disease, stage 3b: Secondary | ICD-10-CM

## 2017-08-27 MED ORDER — PAROXETINE HCL 20 MG PO TABS: 20.0000 mg | ORAL_TABLET | Freq: Every day | ORAL | 2 refills | Status: DC

## 2017-08-27 NOTE — Progress Notes (Signed)
Subjective:    Patient ID: Yvonne Long, female    DOB: 1951-06-09, 66 y.o.   MRN: [id number]  HPI F/U depression - she si doing well. Says has tried to come off a few times and feel like a dark cloud is over her.  She has no S.E. On the medication.    CKD 3 - no recent urinary sxs.  Lab Results  Component Value Date   CREATININE 0.75 03/19/2016   Hyperlipidemia - not current on medication.    Review of Systems  BP (!) 133/54   Pulse 75   Ht 5\' 6"  (1.676 m)   Wt 216 lb (98 kg)   SpO2 97%   BMI 34.86 kg/m     Allergies  Allergen Reactions  . Codeine     Past Medical History:  Diagnosis Date  . Diverticulum     Past Surgical History:  Procedure Laterality Date  . ABDOMINAL HYSTERECTOMY  2005   Dr. Toy Cookey  . BILATERAL SALPINGOOPHORECTOMY  2005   Dr. Noralee Stain  . CHOLECYSTECTOMY  2013  . CYSTOCELE REPAIR  2005   with bladder tack  . SPINE SURGERY  2/11   Lumbar Laminectomy-Dr. Owens Shark    Social History   Social History  . Marital status: Married    Spouse name: Darnell Level  . Number of children: 2  . Years of education: N/A   Occupational History  . [employer]  . dog grommer    Social History Main Topics  . Smoking status: Former Smoker    Quit date: 12/21/1994  . Smokeless tobacco: Not on file  . Alcohol use Yes     Comment: 1 alcoholic drink per day  . Drug use: No  . Sexual activity: Not on file   Other Topics Concern  . Not on file   Social History Narrative   1 caffeinated drink/day.  Enjoys yard work and yoga    Family History  Problem Relation Age of Onset  . Hypertension Mother   . Alcohol abuse Father   . Depression Father   . Peripheral vascular disease Father   . Thrombosis Father        deceased from thrombosis of leg.  . Cancer Other        Brain cancer  . Stroke Other   . Heart attack Other   . Depression Son     Outpatient Encounter Prescriptions as of 08/27/2017  Medication Sig  .  acitretin (SORIATANE) 25 MG capsule 1 CAPSULE BY MOUTH DAILY TAKE ONE TAB DAILY  . aspirin 81 MG tablet Take 81 mg by mouth daily.  . Biotin 1000 MCG tablet Take 1,000 mcg by mouth 3 (three) times daily.  . Calcium Carbonate-Vitamin D (CALTRATE 600+D) 600-400 MG-UNIT per tablet Take 1 tablet by mouth daily.    Marland Kitchen PARoxetine (PAXIL) 20 MG tablet Take 1 tablet (20 mg total) by mouth daily.  . [DISCONTINUED] PARoxetine (PAXIL) 20 MG tablet Take 1 tablet (20 mg total) by mouth daily. MUST MAKE AN APPOINTMENT FOR FUTURE REFILLS  . [DISCONTINUED] albuterol (PROVENTIL HFA;VENTOLIN HFA) 108 (90 Base) MCG/ACT inhaler Inhale 2-4 puffs into the lungs every 6 (six) hours as needed for wheezing or shortness of breath.  . [DISCONTINUED] cyclobenzaprine (FLEXERIL) 10 MG tablet Take 1 tablet (10 mg total) by mouth 3 (three) times daily as needed for muscle spasms.  . [DISCONTINUED] Multiple Vitamin (MULTIVITAMIN) capsule Take 1 capsule by mouth daily.  . [DISCONTINUED] Omega-3 Fatty Acids (FISH  OIL PO) Take 1 capsule by mouth daily.    . [DISCONTINUED] VITAMIN D, CHOLECALCIFEROL, PO Take by mouth.  . [DISCONTINUED] VITAMIN K PO Take by mouth.   No facility-administered encounter medications on file as of 08/27/2017.          Objective:   Physical Exam  Constitutional: She is oriented to person, place, and time. She appears well-developed and well-nourished.  HENT:  Head: Normocephalic and atraumatic.  Cardiovascular: Normal rate, regular rhythm and normal heart sounds.   Pulmonary/Chest: Effort normal and breath sounds normal.  Neurological: She is alert and oriented to person, place, and time.  Skin: Skin is warm and dry.  Psychiatric: She has a normal mood and affect. Her behavior is normal.          Assessment & Plan:  Depression - PHQ- 9 score of 0. Not wanting to taper medication. F/U in 9-12 months.    Elevated lipids-due to recheck.  CKD 3-due to recheck creatinine and urine micral  albumin. Continue to monitor Q 6 months.   mammo ordered.

## 2017-09-15 LAB — HM MAMMOGRAPHY

## 2017-10-08 ENCOUNTER — Encounter: Payer: Self-pay | Admitting: Family Medicine

## 2017-10-13 ENCOUNTER — Ambulatory Visit (INDEPENDENT_AMBULATORY_CARE_PROVIDER_SITE_OTHER): Payer: Commercial Managed Care - PPO | Admitting: Physician Assistant

## 2017-10-13 DIAGNOSIS — Z23 Encounter for immunization: Secondary | ICD-10-CM

## 2018-01-20 ENCOUNTER — Ambulatory Visit: Payer: Commercial Managed Care - PPO

## 2018-01-27 ENCOUNTER — Encounter: Payer: Self-pay | Admitting: Family Medicine

## 2018-01-27 ENCOUNTER — Ambulatory Visit (INDEPENDENT_AMBULATORY_CARE_PROVIDER_SITE_OTHER): Payer: Commercial Managed Care - PPO | Admitting: Family Medicine

## 2018-01-27 VITALS — BP 130/74 | HR 66 | Ht 66.0 in | Wt 211.0 lb

## 2018-01-27 DIAGNOSIS — N1832 Chronic kidney disease, stage 3b: Secondary | ICD-10-CM

## 2018-01-27 DIAGNOSIS — R002 Palpitations: Secondary | ICD-10-CM | POA: Diagnosis not present

## 2018-01-27 DIAGNOSIS — E785 Hyperlipidemia, unspecified: Secondary | ICD-10-CM

## 2018-01-27 DIAGNOSIS — R7303 Prediabetes: Secondary | ICD-10-CM | POA: Diagnosis not present

## 2018-01-27 DIAGNOSIS — J3081 Allergic rhinitis due to animal (cat) (dog) hair and dander: Secondary | ICD-10-CM

## 2018-01-27 DIAGNOSIS — F330 Major depressive disorder, recurrent, mild: Secondary | ICD-10-CM

## 2018-01-27 DIAGNOSIS — N183 Chronic kidney disease, stage 3 (moderate): Secondary | ICD-10-CM | POA: Diagnosis not present

## 2018-01-27 MED ORDER — FLUTICASONE PROPIONATE 50 MCG/ACT NA SUSP: 2.0000 | Freq: Every day | NASAL | 6 refills | Status: DC

## 2018-01-27 NOTE — Addendum Note (Signed)
Addended by: Teddy Spike on: 01/27/2018 09:28 AM   Modules accepted: Orders

## 2018-01-27 NOTE — Patient Instructions (Signed)
If not better on the fluticasone nasal spray after about 2-3 weeks and please give [place] a call back and we can add Atrovent.  Please try to avoid all decongestants and after couple of days on the nasal spray okay to stop the Zyrtec.

## 2018-01-27 NOTE — Progress Notes (Addendum)
Subjective:    CC: HTN , wants to discuss today.    HPI:  59-year female comes in today complaining of frequent heart palpitations.  Over the last several months she is actually had 2 upper respiratory infections and one sinus infection.  In fact she is actually on Augmentin right now.  [facility]  She has about 2 more doses left and she is feeling better.  Her concern is that when she had takes cold medication and/or if she drinks caffeine she feels her heart fluttering.  She says it has been going on for several months.  She has problems with chronic nasal congestion and mucus production in her sinuses and throat.  She does use saline regularly.  She knows she is allergic to dogs and has 4 in her home so she takes Zyrtec daily.   Past medical history, Surgical history, Family history not pertinant except as noted below, Social history, Allergies, and medications have been entered into the medical record, reviewed, and corrections made.   Review of Systems: No fevers, chills, night sweats, weight loss, chest pain, or shortness of breath.   Objective:    General: Well Developed, well nourished, and in no acute distress.  Neuro: Alert and oriented x3, extra-ocular muscles intact, sensation grossly intact.  HEENT: Normocephalic, atraumatic  Skin: Warm and dry, no rashes. Cardiac: Regular rate and rhythm, no murmurs rubs or gallops, no lower extremity edema.  Respiratory: Clear to auscultation bilaterally. Not using accessory muscles, speaking in full sentences.   Impression and Recommendations:   Palpitations-I think this is directly related to taking decongestants and caffeine intake.  Explained that it can just be tachycardia or increased heart rate from these medications and/or increase in PACs or PVCs which can be triggered.  Discussed that these are typically benign and not harmful.  Encouraged her to stop her decongestants and really significantly decrease her  caffeine intake and see if her symptoms improve.  In the meantime we will work on her sinuses.  Allergic rhinitis with recurrent sinus infections-should be a great candidate for a nasal steroid spray.  We will send over nasal fluticasone to the pharmacy.  I want her to try this for at least 2-3 weeks.  If at that point she still having significant symptoms then we can consider adding Atrovent to help dry up secretions.  Continue with nasal saline rinse.  After couple days she can go ahead and stop the Zyrtec.  [facility] - due for repeat labs.  She forgot to go in September.   EKG today shows rate of 63 bpm, normal sinus rhythm with no acute ST-T wave changes.

## 2018-01-28 ENCOUNTER — Telehealth: Payer: Self-pay | Admitting: *Deleted

## 2018-01-28 ENCOUNTER — Other Ambulatory Visit: Payer: Self-pay | Admitting: *Deleted

## 2018-01-28 DIAGNOSIS — R748 Abnormal levels of other serum enzymes: Secondary | ICD-10-CM

## 2018-01-28 LAB — COMPLETE METABOLIC PANEL WITH GFR
AG Ratio: 1.8 (calc) (ref 1.0–2.5)
ALT: 36 U/L — ABNORMAL HIGH (ref 6–29)
AST: 26 U/L (ref 10–35)
Albumin: 4.4 g/dL (ref 3.6–5.1)
Alkaline phosphatase (APISO): 61 U/L (ref 33–130)
BUN: 15 mg/dL (ref 7–25)
CO2: 25 mmol/L (ref 20–32)
Calcium: 9.7 mg/dL (ref 8.6–10.4)
Chloride: 106 mmol/L (ref 98–110)
Creat: 0.9 mg/dL (ref 0.50–0.99)
GFR, Est African American: 78 mL/min/{1.73_m2} (ref >=60)
GFR, Est Non African American: 67 mL/min/{1.73_m2} (ref >=60)
Globulin: 2.5 g/dL (calc) (ref 1.9–3.7)
Glucose, Bld: 127 mg/dL — ABNORMAL HIGH (ref 65–99)
Potassium: 4.7 mmol/L (ref 3.5–5.3)
Sodium: 142 mmol/L (ref 135–146)
Total Bilirubin: 0.5 mg/dL (ref 0.2–1.2)
Total Protein: 6.9 g/dL (ref 6.1–8.1)

## 2018-01-28 LAB — MICROALBUMIN, URINE: Microalb, Ur: 0.6 mg/dL

## 2018-01-28 LAB — LIPID PANEL W/REFLEX DIRECT LDL
Cholesterol: 264 mg/dL — ABNORMAL HIGH (ref <200)
HDL: 62 mg/dL (ref >50)
LDL Cholesterol (Calc): 163 mg/dL (calc) — ABNORMAL HIGH
Non-HDL Cholesterol (Calc): 202 mg/dL (calc) — ABNORMAL HIGH (ref <130)
Total CHOL/HDL Ratio: 4.3 (calc) (ref <5.0)
Triglycerides: 230 mg/dL — ABNORMAL HIGH (ref <150)

## 2018-01-28 LAB — HEMOGLOBIN A1C
Hgb A1c MFr Bld: 6.1 % of total Hgb — ABNORMAL HIGH (ref <5.7)
Mean Plasma Glucose: 128 (calc)
eAG (mmol/L): 7.1 (calc)

## 2018-01-28 NOTE — Telephone Encounter (Signed)
Forms completed,copied,scanned,faxed,confirmation received.Yvonne Long, [place]

## 2018-01-31 ENCOUNTER — Telehealth: Payer: Self-pay | Admitting: Family Medicine

## 2018-01-31 MED ORDER — ATORVASTATIN CALCIUM 20 MG PO TABS: 20.0000 mg | ORAL_TABLET | Freq: Every day | ORAL | 3 refills | Status: DC

## 2018-01-31 NOTE — Telephone Encounter (Signed)
Pt was advised based on most recent labs she is to start a statin for her cholesterol. No rx sent. Will route to PCP for order. [facility] [address] [facility] [place] [facility] correct [facility].

## 2018-01-31 NOTE — Telephone Encounter (Signed)
New prescription sent plate.  Please tell her I apologize for the delay.  Blood thank you for I am glad she called.

## 2018-02-01 NOTE — Telephone Encounter (Signed)
Pt advised.

## 2018-05-23 ENCOUNTER — Ambulatory Visit: Payer: Commercial Managed Care - PPO | Admitting: Family Medicine

## 2018-05-23 ENCOUNTER — Encounter: Payer: Self-pay | Admitting: Family Medicine

## 2018-05-23 ENCOUNTER — Telehealth: Payer: Self-pay | Admitting: Family Medicine

## 2018-05-23 VITALS — Wt 223.0 lb

## 2018-05-23 DIAGNOSIS — R51 Headache: Secondary | ICD-10-CM

## 2018-05-23 DIAGNOSIS — R42 Dizziness and giddiness: Secondary | ICD-10-CM | POA: Diagnosis not present

## 2018-05-23 DIAGNOSIS — J0110 Acute frontal sinusitis, unspecified: Secondary | ICD-10-CM | POA: Diagnosis not present

## 2018-05-23 DIAGNOSIS — R519 Headache, unspecified: Secondary | ICD-10-CM

## 2018-05-23 MED ORDER — PREDNISONE 20 MG PO TABS: 40.0000 mg | ORAL_TABLET | Freq: Every day | ORAL | 0 refills | Status: DC

## 2018-05-23 MED ORDER — AZITHROMYCIN 250 MG PO TABS: ORAL_TABLET | ORAL | 0 refills | Status: AC

## 2018-05-23 NOTE — Telephone Encounter (Signed)
Call pt: BP was high today. Have her f/u for nurse visit in one week to recheck.

## 2018-05-23 NOTE — Progress Notes (Signed)
Subjective:    Patient ID: Yvonne Long, female    DOB: 1951-07-31, 67 y.o.   MRN: [id number]  HPI 67 yo female c/ of "swooshing" in her ears and some sinus pressure. Says is started day after she was at a wedding and was dancing and had a glass of champagne.  Has had this before but this feels a little bit different.  She can try to clean out her ears because she was hearing this noise in her ears.  She is had a headache right in the middle of her forehead just above the nasal bridge.  She has been sneezing a lot and has really been bothered by allergies over the last couple of weeks.  She just feels extremely tired.  And has some soreness across her nose.  Feels better when lying down. Worse when sitting up. Had to leave work today as she was really having a hard time looking at her computer screen.   Review of Systems  See orthostatics.   Allergies  Allergen Reactions  . Codeine     Past Medical History:  Diagnosis Date  . Diverticulum     Past Surgical History:  Procedure Laterality Date  . ABDOMINAL HYSTERECTOMY  2005   Dr. Toy Cookey  . BILATERAL SALPINGOOPHORECTOMY  2005   Dr. Noralee Stain  . CHOLECYSTECTOMY  2013  . CYSTOCELE REPAIR  2005   with bladder tack  . SPINE SURGERY  2/11   Lumbar Laminectomy-Dr. Owens Shark    Social History   Socioeconomic History  . Marital status: Married    Spouse name: Darnell Level  . Number of children: 2  . Years of education: Not on file  . Highest education level: Not on file  Occupational History  . Occupation: Web designer: [employer]  . Occupation: Freight forwarder  Social Needs  . Financial resource strain: Not on file  . Food insecurity:    Worry: Not on file    Inability: Not on file  . Transportation needs:    Medical: Not on file    Non-medical: Not on file  Tobacco Use  . Smoking status: Former Smoker    Last attempt to quit: 12/21/1994    Years since quitting: 23.4  . Smokeless tobacco: Never  Used  Substance and Sexual Activity  . Alcohol use: Yes    Comment: 1 alcoholic drink per day  . Drug use: No  . Sexual activity: Not on file  Lifestyle  . Physical activity:    Days per week: Not on file    Minutes per session: Not on file  . Stress: Not on file  Relationships  . Social connections:    Talks on phone: Not on file    Gets together: Not on file    Attends religious service: Not on file    Active member of club or organization: Not on file    Attends meetings of clubs or organizations: Not on file    Relationship status: Not on file  . Intimate partner violence:    Fear of current or ex partner: Not on file    Emotionally abused: Not on file    Physically abused: Not on file    Forced sexual activity: Not on file  Other Topics Concern  . Not on file  Social History Narrative   1 caffeinated drink/day.  Enjoys yard work and yoga    Family History  Problem Relation Age of Onset  . Hypertension Mother   .  Alcohol abuse Father   . Depression Father   . Peripheral vascular disease Father   . Thrombosis Father        deceased from thrombosis of leg.  . Cancer Other        Brain cancer  . Stroke Other   . Heart attack Other   . Depression Son     Outpatient Encounter Medications as of 05/23/2018  Medication Sig  . acitretin (SORIATANE) 25 MG capsule 1 CAPSULE BY MOUTH DAILY TAKE ONE TAB DAILY  . Ascorbic Acid (VITAMIN C) 1000 MG tablet Take 1,000 mg by mouth daily.  Marland Kitchen aspirin 81 MG tablet Take 81 mg by mouth daily.  Marland Kitchen atorvastatin (LIPITOR) 20 MG tablet Take 1 tablet (20 mg total) by mouth at bedtime.  Marland Kitchen azithromycin (ZITHROMAX) 250 MG tablet 2 Ttabs PO on Day 1, then one a day x 4 days.  . Biotin 1000 MCG tablet Take 1,000 mcg by mouth 3 (three) times daily.  . Cholecalciferol (EQL VITAMIN D3) 400 units CAPS Take 1 capsule by mouth daily.  . fluticasone (FLONASE) 50 MCG/ACT nasal spray Place 2 sprays into both nostrils daily.  Marland Kitchen PARoxetine (PAXIL) 20 MG  tablet Take 1 tablet (20 mg total) by mouth daily.  . predniSONE (DELTASONE) 20 MG tablet Take 2 tablets (40 mg total) by mouth daily with breakfast.   No facility-administered encounter medications on file as of 05/23/2018.          Objective:   Physical Exam  Constitutional: She is oriented to person, place, and time. She appears well-developed and well-nourished.  HENT:  Head: Normocephalic and atraumatic.  Right Ear: External ear normal.  Left Ear: External ear normal.  Nose: Nose normal.  Mouth/Throat: Oropharynx is clear and moist.  TMs and canals are clear.   Eyes: Pupils are equal, round, and reactive to light. Conjunctivae and EOM are normal. Right eye exhibits no discharge. Left eye exhibits no discharge.  Neck: Neck supple. No thyromegaly present.  Cardiovascular: Normal rate, regular rhythm and normal heart sounds.  Pulmonary/Chest: Effort normal and breath sounds normal. She has no wheezes.  Lymphadenopathy:    She has no cervical adenopathy.  Neurological: She is alert and oriented to person, place, and time. She displays normal reflexes. No cranial nerve deficit. She exhibits normal muscle tone. Coordination normal.  Negative Dix-Hallpike maneuver but she did have dizziness with lateral gaze in both directions.  Skin: Skin is warm and dry.  Psychiatric: She has a normal mood and affect.       Assessment & Plan:  Dizziness-I really think she likely has a sinus infection since she does not really have a positive Dix-Hallpike maneuver.  Will treat with azithromycin and prednisone because it does sound like she is having some significant allergy symptoms as well.  If she is not feeling much better by the end of the week then consider referral for vertigo.

## 2018-05-23 NOTE — Telephone Encounter (Signed)
Called and scheduled pt for a nurse visit next Monday, 05-30-18, at 11:00

## 2018-05-24 ENCOUNTER — Other Ambulatory Visit: Payer: Self-pay | Admitting: Family Medicine

## 2018-05-24 DIAGNOSIS — F330 Major depressive disorder, recurrent, mild: Secondary | ICD-10-CM

## 2018-05-30 ENCOUNTER — Ambulatory Visit: Payer: Commercial Managed Care - PPO

## 2018-06-01 ENCOUNTER — Ambulatory Visit (INDEPENDENT_AMBULATORY_CARE_PROVIDER_SITE_OTHER): Payer: Commercial Managed Care - PPO | Admitting: Family Medicine

## 2018-06-01 VITALS — BP 135/57 | HR 67 | Ht 66.0 in | Wt 212.0 lb

## 2018-06-01 DIAGNOSIS — R030 Elevated blood-pressure reading, without diagnosis of hypertension: Secondary | ICD-10-CM

## 2018-06-01 NOTE — Progress Notes (Signed)
   Subjective:    Patient ID: Yvonne Long, female    DOB: 08/13/1951, 67 y.o.   MRN: [id number]  HPI Patient here for recheck BP.  Pt has no complaints of SOB, chest pain, palpitations, headache or dizziness.   Review of Systems     Objective:   Physical Exam        Assessment & Plan:  Advised patient to keep regular follow up with Dr. Madilyn Fireman.

## 2018-06-01 NOTE — Progress Notes (Signed)
Blood pressure was elevated at last office visit even though she does not technically have a diagnosis of hypertension so had her come back in for nurse visit today.  Blood pressure looks much better.  We will continue to monitor carefully.  Agree with documentation as above.   Beatrice Lecher, MD

## 2018-06-09 ENCOUNTER — Other Ambulatory Visit: Payer: Self-pay | Admitting: *Deleted

## 2018-06-09 DIAGNOSIS — E785 Hyperlipidemia, unspecified: Secondary | ICD-10-CM

## 2018-06-10 LAB — LIPID PANEL W/REFLEX DIRECT LDL
Cholesterol: 157 mg/dL (ref <200)
HDL: 53 mg/dL (ref >50)
LDL Cholesterol (Calc): 76 mg/dL (calc)
Non-HDL Cholesterol (Calc): 104 mg/dL (calc) (ref <130)
Total CHOL/HDL Ratio: 3 (calc) (ref <5.0)
Triglycerides: 187 mg/dL — ABNORMAL HIGH (ref <150)

## 2018-06-20 ENCOUNTER — Other Ambulatory Visit: Payer: Self-pay | Admitting: Family Medicine

## 2018-06-20 LAB — COMPLETE METABOLIC PANEL WITH GFR
AG Ratio: 1.9 (calc) (ref 1.0–2.5)
ALT: 23 U/L (ref 6–29)
AST: 17 U/L (ref 10–35)
Albumin: 4 g/dL (ref 3.6–5.1)
Alkaline phosphatase (APISO): 65 U/L (ref 33–130)
BUN: 17 mg/dL (ref 7–25)
CO2: 28 mmol/L (ref 20–32)
Calcium: 9 mg/dL (ref 8.6–10.4)
Chloride: 107 mmol/L (ref 98–110)
Creat: 0.91 mg/dL (ref 0.50–0.99)
GFR, Est African American: 77 mL/min/{1.73_m2} (ref >=60)
GFR, Est Non African American: 66 mL/min/{1.73_m2} (ref >=60)
Globulin: 2.1 g/dL (calc) (ref 1.9–3.7)
Glucose, Bld: 139 mg/dL — ABNORMAL HIGH (ref 65–99)
Potassium: 4.5 mmol/L (ref 3.5–5.3)
Sodium: 141 mmol/L (ref 135–146)
Total Bilirubin: 0.4 mg/dL (ref 0.2–1.2)
Total Protein: 6.1 g/dL (ref 6.1–8.1)

## 2018-06-21 NOTE — Progress Notes (Signed)
All labs are normal. 

## 2018-06-28 ENCOUNTER — Other Ambulatory Visit: Payer: Self-pay | Admitting: Family Medicine

## 2018-08-12 ENCOUNTER — Ambulatory Visit (INDEPENDENT_AMBULATORY_CARE_PROVIDER_SITE_OTHER): Payer: Commercial Managed Care - PPO | Admitting: Family Medicine

## 2018-08-12 ENCOUNTER — Encounter: Payer: Self-pay | Admitting: Family Medicine

## 2018-08-12 VITALS — BP 138/52 | HR 63 | Ht 66.0 in | Wt 214.0 lb

## 2018-08-12 DIAGNOSIS — F330 Major depressive disorder, recurrent, mild: Secondary | ICD-10-CM | POA: Diagnosis not present

## 2018-08-12 DIAGNOSIS — N3941 Urge incontinence: Secondary | ICD-10-CM | POA: Diagnosis not present

## 2018-08-12 DIAGNOSIS — R300 Dysuria: Secondary | ICD-10-CM

## 2018-08-12 DIAGNOSIS — R3915 Urgency of urination: Secondary | ICD-10-CM

## 2018-08-12 DIAGNOSIS — R319 Hematuria, unspecified: Secondary | ICD-10-CM

## 2018-08-12 LAB — POCT URINALYSIS DIPSTICK
Bilirubin, UA: NEGATIVE
Glucose, UA: NEGATIVE
Ketones, UA: NEGATIVE
Leukocytes, UA: NEGATIVE
Nitrite, UA: NEGATIVE
Protein, UA: NEGATIVE
Spec Grav, UA: 1.015 (ref 1.010–1.025)
Urobilinogen, UA: 0.2 E.U./dL
pH, UA: 7 (ref 5.0–8.0)

## 2018-08-12 MED ORDER — PAROXETINE HCL 30 MG PO TABS: 30.0000 mg | ORAL_TABLET | Freq: Every day | ORAL | 1 refills | Status: DC

## 2018-08-12 NOTE — Progress Notes (Signed)
   Subjective:    Patient ID: Yvonne Long, female    DOB: February 23, 1951, 67 y.o.   MRN: [id number]  HPI 67 year old female comes in complaining of urinary urgency for greater than 1 week. pt reports that she feels full and has to go, she has been drinking more cranberry juice , no fevers,sweats, chills.  She is really not had any dysuria or hematuria.  She says she just feels like she has to go a lot.  Though she sometimes wonders if she is not completely emptying.  She has noticed a little bit weaker stream over time.    Depressive disorder-she did want to let me know that she went ahead and increase her Paxil to one half tabs for a total of 30 mg she was just feeling a little stressed at work and at home with some family situations and says it has been helpful.  She would like a new prescription sent to her mail order.  Review of Systems     Objective:   Physical Exam  Constitutional: She is oriented to person, place, and time. She appears well-developed and well-nourished.  HENT:  Head: Normocephalic and atraumatic.  Eyes: Conjunctivae and EOM are normal.  Cardiovascular: Normal rate.  Pulmonary/Chest: Effort normal.  Neurological: She is alert and oriented to person, place, and time.  Skin: Skin is dry. No pallor.  Psychiatric: She has a normal mood and affect. Her behavior is normal.  Vitals reviewed.        Assessment & Plan:  Urinary urgency-dipstick positive for trace blood but negative for nitrites and leukocytes.  I gave her an option of treatment versus sending for culture.  She opted to wait for culture results.  She will call back if she feels like she is getting worse or developing new symptoms.  If it does come back negative then we discussed the possibility of overactive bladder versus a urethral stricture and would recommend urology consultation for further work-up based on what she is describing.  She will think about it.    Since we did see trace blood then  we may need to repeat in one week for micro review.    Depressive disorder-Paxil increased to 30 mg a new prescription sent to mail order for 32-month supply.

## 2018-08-13 LAB — URINALYSIS, MICROSCOPIC ONLY
Bacteria, UA: NONE SEEN /HPF
Hyaline Cast: NONE SEEN /LPF

## 2018-08-13 LAB — URINE CULTURE
MICRO NUMBER:: 91009667
SPECIMEN QUALITY:: ADEQUATE

## 2018-08-19 ENCOUNTER — Ambulatory Visit: Payer: Commercial Managed Care - PPO | Admitting: Family Medicine

## 2018-08-31 ENCOUNTER — Telehealth: Payer: Self-pay | Admitting: Family Medicine

## 2018-08-31 NOTE — Telephone Encounter (Signed)
Please call [facility]. Please schedule her for surgery pre-op clearance on my schedule.

## 2018-09-01 NOTE — Telephone Encounter (Signed)
Patient scheduled.

## 2018-09-15 ENCOUNTER — Encounter: Payer: Self-pay | Admitting: Family Medicine

## 2018-09-15 ENCOUNTER — Ambulatory Visit (INDEPENDENT_AMBULATORY_CARE_PROVIDER_SITE_OTHER): Payer: Commercial Managed Care - PPO | Admitting: Family Medicine

## 2018-09-15 VITALS — BP 135/58 | HR 58 | Ht 66.0 in | Wt 214.0 lb

## 2018-09-15 DIAGNOSIS — Z01818 Encounter for other preprocedural examination: Secondary | ICD-10-CM | POA: Diagnosis not present

## 2018-09-15 DIAGNOSIS — Z23 Encounter for immunization: Secondary | ICD-10-CM

## 2018-09-15 NOTE — Progress Notes (Addendum)
Subjective:    Patient ID: Yvonne Long, female    DOB: February 26, 1951, 67 y.o.   MRN: [id number]  HPI 67 year old female is here today for presurgical clearance.  She is scheduled for left total knee replacement at [facility].  She has a history of CKD 3, hyperlipidemia, and prediabetes.  She is really never had any major problems with surgery she said one time she was told that she was little difficult to awaken but otherwise has not had any complications.  She has not had any sore throat or swallowing problems.  She does report that she snores occasionally.  She is able to walk several city blocks without shortness of breath.  She has not had any recent chest pain, shortness of breath, palpitations or extremity swelling.  She is compliant with her medications.   Review of Systems  BP (!) 135/58   Pulse (!) 58   Ht 5\' 6"  (1.676 m)   Wt 214 lb (97.1 kg)   SpO2 99%   BMI 34.54 kg/m     Allergies  Allergen Reactions  . Codeine     Past Medical History:  Diagnosis Date  . Diverticulum     Past Surgical History:  Procedure Laterality Date  . ABDOMINAL HYSTERECTOMY  2005   Dr. Toy Cookey  . BILATERAL SALPINGOOPHORECTOMY  2005   Dr. Noralee Stain  . CHOLECYSTECTOMY  2013  . CYSTOCELE REPAIR  2005   with bladder tack  . SPINE SURGERY  2/11   Lumbar Laminectomy-Dr. Owens Shark    Social History   Socioeconomic History  . Marital status: Married    Spouse name: Darnell Level  . Number of children: 2  . Years of education: Not on file  . Highest education level: Not on file  Occupational History  . Occupation: Web designer: [employer]  . Occupation: Freight forwarder  Social Needs  . Financial resource strain: Not on file  . Food insecurity:    Worry: Not on file    Inability: Not on file  . Transportation needs:    Medical: Not on file    Non-medical: Not on file  Tobacco Use  . Smoking status: Former Smoker    Last attempt to quit:  12/21/1994    Years since quitting: 23.7  . Smokeless tobacco: Never Used  Substance and Sexual Activity  . Alcohol use: Yes    Comment: 1 alcoholic drink per day  . Drug use: No  . Sexual activity: Not on file  Lifestyle  . Physical activity:    Days per week: Not on file    Minutes per session: Not on file  . Stress: Not on file  Relationships  . Social connections:    Talks on phone: Not on file    Gets together: Not on file    Attends religious service: Not on file    Active member of club or organization: Not on file    Attends meetings of clubs or organizations: Not on file    Relationship status: Not on file  . Intimate partner violence:    Fear of current or ex partner: Not on file    Emotionally abused: Not on file    Physically abused: Not on file    Forced sexual activity: Not on file  Other Topics Concern  . Not on file  Social History Narrative   1 caffeinated drink/day.  Enjoys yard work and yoga    Family History  Problem Relation  Age of Onset  . Hypertension Mother   . Alcohol abuse Father   . Depression Father   . Peripheral vascular disease Father   . Thrombosis Father        deceased from thrombosis of leg.  . Cancer Other        Brain cancer  . Stroke Other   . Heart attack Other   . Depression Son     Outpatient Encounter Medications as of 09/15/2018  Medication Sig  . acitretin (SORIATANE) 25 MG capsule 1 CAPSULE BY MOUTH DAILY TAKE ONE TAB DAILY  . Ascorbic Acid (VITAMIN C) 1000 MG tablet Take 1,000 mg by mouth daily.  Marland Kitchen aspirin 81 MG tablet Take 81 mg by mouth daily.  Marland Kitchen atorvastatin (LIPITOR) 20 MG tablet Take 1 tablet (20 mg total) by mouth at bedtime.  . Biotin 1000 MCG tablet Take 1,000 mcg by mouth 3 (three) times daily.  . Cholecalciferol (EQL VITAMIN D3) 400 units CAPS Take 1 capsule by mouth daily.  . fluticasone (FLONASE) 50 MCG/ACT nasal spray Place 2 sprays into both nostrils daily.  Marland Kitchen PARoxetine (PAXIL) 30 MG tablet Take 1 tablet  (30 mg total) by mouth daily.  . vitamin B-12 (CYANOCOBALAMIN) 1000 MCG tablet Take 1,000 mcg by mouth daily.  . [DISCONTINUED] predniSONE (DELTASONE) 20 MG tablet Take 2 tablets (40 mg total) by mouth daily with breakfast.  . [DISCONTINUED] PROAIR HFA 108 (90 Base) MCG/ACT inhaler INHALE 2-4 PUFFS INTO THE LUNGS EVERY 6 (SIX) HOURS AS NEEDED FOR WHEEZING OR SHORTNESS OF BREATH.   No facility-administered encounter medications on file as of 09/15/2018.          Objective:   Physical Exam  Constitutional: She is oriented to person, place, and time. She appears well-developed and well-nourished.  HENT:  Head: Normocephalic and atraumatic.  Right Ear: External ear normal.  Left Ear: External ear normal.  Nose: Nose normal.  Mouth/Throat: Oropharynx is clear and moist.  TMs and canals are clear.   Eyes: Pupils are equal, round, and reactive to light. Conjunctivae and EOM are normal.  Neck: Neck supple. No thyromegaly present.  Cardiovascular: Normal rate, regular rhythm and normal heart sounds.  Pulmonary/Chest: Effort normal and breath sounds normal. She has no wheezes.  Abdominal: Soft. Bowel sounds are normal.  Lymphadenopathy:    She has no cervical adenopathy.  Neurological: She is alert and oriented to person, place, and time.  Skin: Skin is warm and dry.  Psychiatric: She has a normal mood and affect.        Assessment & Plan:  Preoperative clearance for left total knee replacement -patient is cleared for surgery.  She is overall low risk.  She is compliant with her medications and blood pressures at goal.  We will get some up-to-date blood work just to make sure there is no anemia prior to surgery and to make sure that her renal function is at goal.  Was directed to avoid all blood thinners including aspirin, fish oil, etc.  EKG shows rate of 55 bpm, normal sinus rhythm with no acute ST-T wave changes.  Slightly bradycardic compared to previous EKG and done in February 2019  but otherwise no significant changes.  We did have her do a sleep apnea screen.  She did screen positive for 3 questions on the stop bang questionnaire.  Yes for tired observed stopping breathing, and age greater than 9.  She denies any significant snoring.  We may need to work this up further  with a home sleep study.

## 2018-09-15 NOTE — Patient Instructions (Addendum)
Just make sure that you avoid any aspirin, ibuprofen, Aleve or fish oil type products for a week before your surgery these can thin your blood.  Yvonne Long with your surgery!!

## 2018-09-16 ENCOUNTER — Encounter: Payer: Self-pay | Admitting: Family Medicine

## 2018-09-16 ENCOUNTER — Telehealth: Payer: Self-pay | Admitting: Family Medicine

## 2018-09-16 DIAGNOSIS — R0683 Snoring: Secondary | ICD-10-CM

## 2018-09-16 LAB — BASIC METABOLIC PANEL WITH GFR
BUN: 15 mg/dL (ref 7–25)
CO2: 27 mmol/L (ref 20–32)
Calcium: 9 mg/dL (ref 8.6–10.4)
Chloride: 106 mmol/L (ref 98–110)
Creat: 0.86 mg/dL (ref 0.50–0.99)
GFR, Est African American: 82 mL/min/{1.73_m2} (ref >=60)
GFR, Est Non African American: 71 mL/min/{1.73_m2} (ref >=60)
Glucose, Bld: 137 mg/dL — ABNORMAL HIGH (ref 65–99)
Potassium: 4.3 mmol/L (ref 3.5–5.3)
Sodium: 140 mmol/L (ref 135–146)

## 2018-09-16 LAB — CBC
HCT: 41.1 % (ref 35.0–45.0)
Hemoglobin: 13.5 g/dL (ref 11.7–15.5)
MCH: 28.9 pg (ref 27.0–33.0)
MCHC: 32.8 g/dL (ref 32.0–36.0)
MCV: 88 fL (ref 80.0–100.0)
MPV: 11.2 fL (ref 7.5–12.5)
Platelets: 248 10*3/uL (ref 140–400)
RBC: 4.67 10*6/uL (ref 3.80–5.10)
RDW: 12.7 % (ref 11.0–15.0)
WBC: 7.7 10*3/uL (ref 3.8–10.8)

## 2018-09-16 NOTE — Telephone Encounter (Signed)
Please call patient: On the questionnaire that we gave her for screening for sleep apnea she did answer yes to 3 of the questions.  This means that she could be at increased risk of sleep apnea it does not 100% diagnose it but it does indicate that we may need to consider getting her tested.  We could consider doing a home sleep study if she is open to that then please let me know.

## 2018-09-16 NOTE — Progress Notes (Signed)
All labs are normal. 

## 2018-09-19 ENCOUNTER — Telehealth: Payer: Self-pay | Admitting: *Deleted

## 2018-09-19 NOTE — Telephone Encounter (Signed)
Form completed,faxed,confirmation received and scanned into patient's chart..[name], CMA  

## 2018-09-19 NOTE — Telephone Encounter (Signed)
Pt advised that her form for pre op has been sent.Maryruth Eve, Lahoma Crocker, CMA

## 2018-09-19 NOTE — Telephone Encounter (Signed)
New order placed

## 2018-09-19 NOTE — Telephone Encounter (Signed)
Spoke with Pt, she is OK to do home sleep study. Routing.

## 2018-09-23 ENCOUNTER — Encounter: Payer: Self-pay | Admitting: Family Medicine

## 2018-10-14 ENCOUNTER — Ambulatory Visit (HOSPITAL_BASED_OUTPATIENT_CLINIC_OR_DEPARTMENT_OTHER): Payer: Commercial Managed Care - PPO | Attending: Family Medicine | Admitting: Internal Medicine

## 2018-10-14 DIAGNOSIS — G4733 Obstructive sleep apnea (adult) (pediatric): Secondary | ICD-10-CM | POA: Diagnosis not present

## 2018-10-14 DIAGNOSIS — R0683 Snoring: Secondary | ICD-10-CM | POA: Diagnosis present

## 2018-10-26 ENCOUNTER — Encounter (HOSPITAL_COMMUNITY): Payer: Self-pay

## 2018-10-26 NOTE — Pre-Procedure Instructions (Signed)
Yvonne Long  10/26/2018      [id number] [place], [place] [address] Phone: [phone] Fax: [phone]  [facility] [place] [place] [facility] [place] [place] [address] Phone: [phone] Fax: [phone]    Your procedure is scheduled on November 25th.  Report to [facility]mitting at 0800 A.M.  Call this number if you have problems the morning of surgery:  [phone]   Remember:  Do not eat or drink after midnight.    Take these medicines the morning of surgery with A SIP OF WATER   PARoxetine (PAXIL)   acitretin (SORIATANE)  7 days prior to surgery STOP taking any Aspirin(unless otherwise instructed by your surgeon), Aleve, Naproxen, Ibuprofen, Motrin, Advil, Goody's, BC's, all herbal medications, fish oil, and all vitamins     Do not wear jewelry, make-up or nail polish.  Do not wear lotions, powders, or perfumes, or deodorant.  Do not shave 48 hours prior to surgery.  Men may shave face and neck.  Do not bring valuables to the hospital.  [facility] is not responsible for any belongings or valuables.  Contacts, dentures or bridgework may not be worn into surgery.  Leave your suitcase in the car.  After surgery it may be brought to your room.  For patients admitted to the hospital, discharge time will be determined by your treatment team.  Patients discharged the day of surgery will not be allowed to drive home.    [facility]- Preparing For Surgery  Before surgery, you can play an important role. Because skin is not sterile, your skin needs to be as free of germs as possible. You can reduce the number of germs on your skin by washing with CHG (chlorahexidine gluconate) Soap before surgery.  CHG is an antiseptic cleaner which kills germs and bonds with the skin to continue killing germs  even after washing.    Oral Hygiene is also important to reduce your risk of infection.  Remember - BRUSH YOUR TEETH THE MORNING OF SURGERY WITH YOUR REGULAR TOOTHPASTE  Please do not use if you have an allergy to CHG or antibacterial soaps. If your skin becomes reddened/irritated stop using the CHG.  Do not shave (including legs and underarms) for at least 48 hours prior to first CHG shower. It is OK to shave your face.  Please follow these instructions carefully.   1. Shower the NIGHT BEFORE SURGERY and the MORNING OF SURGERY with CHG.   2. If you chose to wash your hair, wash your hair first as usual with your normal shampoo.  3. After you shampoo, rinse your hair and body thoroughly to remove the shampoo.  4. Use CHG as you would any other liquid soap. You can apply CHG directly to the skin and wash gently with a scrungie or a clean washcloth.   5. Apply the CHG Soap to your body ONLY FROM THE NECK DOWN.  Do not use on open wounds or open sores. Avoid contact with your eyes, ears, mouth and genitals (private parts). Wash Face and genitals (private parts)  with your normal soap.  6. Wash thoroughly, paying special attention to the area where your surgery will be performed.  7. Thoroughly rinse your body with warm water from the neck down.  8. DO NOT shower/wash with your normal soap after using and  rinsing off the CHG Soap.  9. Pat yourself dry with a CLEAN TOWEL.  10. Wear CLEAN PAJAMAS to bed the night before surgery, wear comfortable clothes the morning of surgery  11. Place CLEAN SHEETS on your bed the night of your first shower and DO NOT SLEEP WITH PETS.    Day of Surgery:  Do not apply any deodorants/lotions.  Please wear clean clothes to the hospital/surgery center.   Remember to brush your teeth WITH YOUR REGULAR TOOTHPASTE.    Please read over the following fact sheets that you were given.

## 2018-10-26 NOTE — Procedures (Signed)
   Patient Name: Yvonne Long, Yvonne Long Date: 10/15/2018 Gender: Female D.O.B: 05/29/1951 Age (years): 67 Referring Provider: Beatrice Lecher Height (inches): 22 Interpreting Physician: Baird Lyons MD, ABSM Weight (lbs): 210 RPSGT: Jacolyn Reedy BMI: 34 MRN: [id number] Neck Size: 16.00  CLINICAL INFORMATION Sleep Study Type: HST Indication for sleep study: Snoring (786.09) Epworth Sleepiness Score: 10  SLEEP STUDY TECHNIQUE A multi-channel overnight portable sleep study was performed. The channels recorded were: nasal airflow, thoracic respiratory movement, and oxygen saturation with a pulse oximetry. Snoring was also monitored.  MEDICATIONS Patient self administered medications include: none reported.  SLEEP ARCHITECTURE Patient was studied for 368.7 minutes. The sleep efficiency was 98.3 % and the patient was supine for 0.1%. The arousal index was 0.0 per hour.  RESPIRATORY PARAMETERS The overall AHI was 14.2 per hour, with a central apnea index of 0.0 per hour. The oxygen nadir was 85% during sleep.  CARDIAC DATA Mean heart rate during sleep was 64.6 bpm.  IMPRESSIONS - Mild obstructive sleep apnea occurred during this study (AHI = 14.2/h). - No significant central sleep apnea occurred during this study (CAI = 0.0/h). - Oxygen desaturation was noted during this study (Min O2 = 85%). - Patient snored.  DIAGNOSIS - Obstructive Sleep Apnea (327.23 [G47.33 ICD-10])  RECOMMENDATIONS - CPAP or a fitted oral appliance would be common choices for scores in this range. Other options including consultation would be based on clinical judgment. - Be careful with alcohol, sedatives and other CNS depressants that may worsen sleep apnea and disrupt normal sleep architecture. - Sleep hygiene should be reviewed to assess factors that may improve sleep quality. - Weight management and regular exercise should be initiated or continued.  [Electronically signed]  10/26/2018 02:51 PM  Baird Lyons MD, [place], American Board of Sleep Medicine   NPI: [id number]                          [place], [place] of Sleep [facility]  ELECTRONICALLY SIGNED ON:  10/26/2018, 2:49 PM [place] PH: [phone]   FX: [phone] [place]

## 2018-10-27 ENCOUNTER — Encounter (HOSPITAL_COMMUNITY)
Admission: RE | Admit: 2018-10-27 | Discharge: 2018-10-27 | Disposition: A | Discharge status: Home / Self Care | Payer: Commercial Managed Care - PPO | Source: Ambulatory Visit | Attending: Orthopedic Surgery | Admitting: Orthopedic Surgery

## 2018-10-27 DIAGNOSIS — Z01812 Encounter for preprocedural laboratory examination: Secondary | ICD-10-CM | POA: Insufficient documentation

## 2018-10-27 LAB — SURGICAL PCR SCREEN
MRSA, PCR: NEGATIVE
Staphylococcus aureus: POSITIVE — AB

## 2018-10-27 NOTE — Pre-Procedure Instructions (Signed)
Yvonne Long  10/27/2018      [id number] [place], [place] [address] Phone: [phone] Fax: [phone]  [facility] [place] [place] [facility] [address] [place] [address] Phone: [phone] Fax: [phone]    Your procedure is scheduled on, Monday  November 25th.   Report to [facility] Admitting at 0800 AM             (posted surgery time 9:54a - 11:54a)   Call this number if you have problems the morning of surgery:  [phone]   Remember:  Do not eat any foods or drink any liquids after midnight, Sunday.    Take these medicines the morning of surgery with A SIP OF WATER :  PARoxetine (PAXIL)   acitretin (SORIATANE)  7 days prior to surgery STOP taking any Aspirin(unless otherwise instructed by your surgeon), Aleve, Naproxen, Ibuprofen, Motrin, Advil, Goody's, BC's, all herbal medications, fish oil, and all vitamins     Do not wear jewelry, make-up or nail polish.  Do not wear lotions, powders,  perfumes, or deodorant.  Do not shave 48 hours prior to surgery.     Do not bring valuables to the hospital.  [facility] is not responsible for any belongings or valuables.  Contacts, dentures or bridgework may not be worn into surgery.  Leave your suitcase in the car.  After surgery it may be brought to your room.  For patients admitted to the hospital, discharge time will be determined by your treatment team.      [facility]- Preparing For Surgery  Before surgery, you can play an important role. Because skin is not sterile, your skin needs to be as free of germs as possible. You can reduce the number of germs on your skin by washing with CHG (chlorahexidine gluconate) Soap before surgery.  CHG is an antiseptic cleaner which kills germs and bonds with the skin to continue killing germs even  after washing.    Oral Hygiene is also important to reduce your risk of infection.    Remember - BRUSH YOUR TEETH THE MORNING OF SURGERY WITH YOUR REGULAR TOOTHPASTE  Please do not use if you have an allergy to CHG or antibacterial soaps. If your skin becomes reddened/irritated stop using the CHG.  Do not shave (including legs and underarms) for at least 48 hours prior to first CHG shower. It is OK to shave your face.  Please follow these instructions carefully.   1. Shower the NIGHT BEFORE SURGERY and the MORNING OF SURGERY with CHG.   2. If you chose to wash your hair, wash your hair first as usual with your normal shampoo.  3. After you shampoo, rinse your hair and body thoroughly to remove the shampoo.  4. Use CHG as you would any other liquid soap. You can apply CHG directly to the skin and wash gently with a scrungie or a clean washcloth.   5. Apply the CHG Soap to your body ONLY FROM THE NECK DOWN.  Do not use on open wounds or open sores. Avoid contact with your eyes, ears, mouth and genitals (private parts). Wash Face and genitals (private parts)  with your normal soap.  6. Wash thoroughly, paying special attention to the area where your surgery will be performed.  7. Thoroughly rinse your body with warm water from the neck  down.  8. DO NOT shower/wash with your normal soap after using and rinsing off the CHG Soap.  9. Pat yourself dry with a CLEAN TOWEL.  10. Wear CLEAN PAJAMAS to bed the night before surgery, wear comfortable clothes the morning of surgery  11. Place CLEAN SHEETS on your bed the night of your first shower and DO NOT SLEEP WITH PETS.  Day of Surgery:  Do not apply any deodorants/lotions.  Please wear clean clothes to the hospital/surgery center.   Remember to brush your teeth WITH YOUR REGULAR TOOTHPASTE.  Please read over the following fact sheets that you were given.

## 2018-11-02 ENCOUNTER — Encounter: Payer: Self-pay | Admitting: Physician Assistant

## 2018-11-02 DIAGNOSIS — M1712 Unilateral primary osteoarthritis, left knee: Secondary | ICD-10-CM

## 2018-11-02 HISTORY — DX: Unilateral primary osteoarthritis, left knee: M17.12

## 2018-11-02 NOTE — H&P (Signed)
TOTAL KNEE ADMISSION H&P  Patient is being admitted for left total knee arthroplasty.  Subjective:  Chief Complaint:left knee pain.  HPI: Yvonne Long, 67 y.o. female, has a history of pain and functional disability in the left knee due to arthritis and has failed non-surgical conservative treatments for greater than 12 weeks to includeNSAID's and/or analgesics, corticosteriod injections, viscosupplementation injections, flexibility and strengthening excercises, use of assistive devices, weight reduction as appropriate and activity modification.  Onset of symptoms was gradual, starting 10 years ago with gradually worsening course since that time. The patient noted no past surgery on the left knee(s).  Patient currently rates pain in the left knee(s) at 10 out of 10 with activity. Patient has night pain, worsening of pain with activity and weight bearing, pain that interferes with activities of daily living, crepitus and joint swelling.  Patient has evidence of subchondral sclerosis, periarticular osteophytes and joint space narrowing by imaging studies.  There is no active infection.  Patient Active Problem List   Diagnosis Date Noted  . Primary localized osteoarthritis of left knee 11/02/2018  . Chronic kidney disease (CKD) stage G3b/A1, moderately decreased glomerular filtration rate (GFR) between 30-44 mL/min/1.73 square meter and albuminuria creatinine ratio less than 30 mg/g (HCC) 03/19/2016  . Thoracic back pain 02/12/2016  . Left knee pain 11/20/2015  . Prediabetes 04/16/2015  . Hyperlipidemia 07/24/2008  . PALPITATIONS 07/05/2007  . Major depressive disorder, recurrent episode ([place]) 09/28/2006   Past Medical History:  Diagnosis Date  . Diverticulum   . Primary localized osteoarthritis of left knee 11/02/2018    Past Surgical History:  Procedure Laterality Date  . ABDOMINAL HYSTERECTOMY  2005   Dr. Toy Cookey  . BILATERAL SALPINGOOPHORECTOMY  2005   Dr. Noralee Stain  .  CHOLECYSTECTOMY  2013  . CYSTOCELE REPAIR  2005   with bladder tack  . SPINE SURGERY  2/11   Lumbar Laminectomy-Dr. Owens Shark    No current facility-administered medications for this encounter.    Current Outpatient Medications  Medication Sig Dispense Refill Last Dose  . acitretin (SORIATANE) 25 MG capsule Take 25 mg by mouth every other day.   6 Taking  . aspirin 81 MG tablet Take 81 mg by mouth every other day.    Taking  . atorvastatin (LIPITOR) 20 MG tablet Take 1 tablet (20 mg total) by mouth at bedtime. 90 tablet 3 Taking  . Biotin 10 MG TABS Take 10 mg by mouth daily.    Taking  . Cholecalciferol (EQL VITAMIN D3) 25 MCG (1000 UT) capsule Take 1,000 Units by mouth daily.    Taking  . Cyanocobalamin (VITAMIN B-12) 5000 MCG TBDP Take 5,000 mcg by mouth daily.    Taking  . ELDERBERRY PO Take 575 mg by mouth daily.     Marland Kitchen GLUCOSAMINE-CHONDROITIN-MSM PO Take 2 tablets by mouth daily.     Marland Kitchen PARoxetine (PAXIL) 30 MG tablet Take 1 tablet (30 mg total) by mouth daily. 90 tablet 1    Allergies  Allergen Reactions  . Codeine Nausea Only and Other (See Comments)    On an empty stomach only    Social History   Tobacco Use  . Smoking status: Former Smoker    Last attempt to quit: 12/21/1994    Years since quitting: 23.8  . Smokeless tobacco: Never Used  Substance Use Topics  . Alcohol use: Yes    Comment: 1 alcoholic drink per day    Family History  Problem Relation Age of Onset  . Alcohol abuse  Father   . Depression Father   . Peripheral vascular disease Father   . Thrombosis Father        deceased from thrombosis of leg.  Marland Kitchen Heart attack Other   . Hypertension Mother   . Cancer Other        Brain cancer  . Stroke Other   . Depression Son      Review of Systems  Constitutional: Negative.   HENT: Negative.   Eyes: Negative.   Respiratory: Negative.   Cardiovascular: Negative.   Gastrointestinal: Negative.   Genitourinary: Negative.   Musculoskeletal: Positive for back pain  and joint pain.  Skin: Negative.   Neurological: Negative.   Endo/Heme/Allergies: Negative.   Psychiatric/Behavioral: Negative.     Objective:  Physical Exam  Constitutional: She is oriented to person, place, and time. She appears well-developed and well-nourished.  HENT:  Head: Normocephalic and atraumatic.  Mouth/Throat: Oropharynx is clear and moist.  Eyes: Pupils are equal, round, and reactive to light. Conjunctivae are normal.  Neck: Neck supple.  Cardiovascular: Normal rate and regular rhythm.  Respiratory: Effort normal and breath sounds normal.  GI: Soft. Bowel sounds are normal.  Genitourinary:  Genitourinary Comments: Not pertinent to current symptomatology therefore not examined.  Musculoskeletal:   Examination of her left knee reveals pain medially.  1+ synovitis.  Mild varus deformity.  Range of motion 0-120 degrees.  Knee is stable with normal patella tracking.  Examination of the right knee reveals full range of motion without pain, swelling, weakness or instability.    Neurological: She is alert and oriented to person, place, and time.  Skin: Skin is warm and dry.  Psychiatric: She has a normal mood and affect. Her behavior is normal.    Vital signs in last 24 hours: Temp:  [98.3 F (36.8 C)] 98.3 F (36.8 C) (11/13 1600) Pulse Rate:  [60] 60 (11/13 1600) BP: (132)/(81) 132/81 (11/13 1600) SpO2:  [98 %] 98 % (11/13 1600) Weight:  [97.1 kg] 97.1 kg (11/13 1600)  Labs:   Estimated body mass index is 34.54 kg/m as calculated from the following:   Height as of this encounter: 5\' 6"  (1.676 m).   Weight as of this encounter: 97.1 kg.   Imaging Review Plain radiographs demonstrate severe degenerative joint disease of the left knee(s). The overall alignment issignificant varus. The bone quality appears to be good for age and reported activity level.   Preoperative templating of the joint replacement has been completed, documented, and submitted to the  Operating Room personnel in order to optimize intra-operative equipment management.    Patient's anticipated LOS is less than 2 midnights, meeting these requirements: - Younger than 94 - Lives within 1 hour of care - Has a competent adult at home to recover with post-op recover - NO history of  - Chronic pain requiring opiods  - Diabetes  - Coronary Artery Disease  - Heart failure  - Heart attack  - Stroke  - DVT/VTE  - Cardiac arrhythmia  - Respiratory Failure/COPD  - Renal failure  - Anemia  - Advanced Liver disease        Assessment/Plan:  End stage arthritis, left knee   The patient history, physical examination, clinical judgment of the provider and imaging studies are consistent with end stage degenerative joint disease of the left knee(s) and total knee arthroplasty is deemed medically necessary. The treatment options including medical management, injection therapy arthroscopy and arthroplasty were discussed at length. The risks and benefits of  total knee arthroplasty were presented and reviewed. The risks due to aseptic loosening, infection, stiffness, patella tracking problems, thromboembolic complications and other imponderables were discussed. The patient acknowledged the explanation, agreed to proceed with the plan and consent was signed. Patient is being admitted for inpatient treatment for surgery, pain control, PT, OT, prophylactic antibiotics, VTE prophylaxis, progressive ambulation and ADL's and discharge planning. The patient is planning to be discharged home with home health services

## 2018-11-04 ENCOUNTER — Encounter (HOSPITAL_COMMUNITY)
Admission: RE | Admit: 2018-11-04 | Discharge: 2018-11-04 | Disposition: A | Discharge status: Home / Self Care | Payer: Commercial Managed Care - PPO | Source: Ambulatory Visit | Attending: Orthopedic Surgery | Admitting: Orthopedic Surgery

## 2018-11-04 ENCOUNTER — Encounter (HOSPITAL_COMMUNITY): Payer: Self-pay

## 2018-11-04 ENCOUNTER — Other Ambulatory Visit: Payer: Self-pay

## 2018-11-04 DIAGNOSIS — Z01812 Encounter for preprocedural laboratory examination: Secondary | ICD-10-CM | POA: Insufficient documentation

## 2018-11-04 HISTORY — DX: Depression, unspecified: F32.A

## 2018-11-04 HISTORY — DX: Major depressive disorder, single episode, unspecified: F32.9

## 2018-11-04 LAB — COMPREHENSIVE METABOLIC PANEL
ALT: 31 U/L (ref 0–44)
AST: 26 U/L (ref 15–41)
Albumin: 3.6 g/dL (ref 3.5–5.0)
Alkaline Phosphatase: 54 U/L (ref 38–126)
Anion gap: 9 (ref 5–15)
BUN: 13 mg/dL (ref 8–23)
CO2: 21 mmol/L — ABNORMAL LOW (ref 22–32)
Calcium: 9 mg/dL (ref 8.9–10.3)
Chloride: 110 mmol/L (ref 98–111)
Creatinine, Ser: 1.04 mg/dL — ABNORMAL HIGH (ref 0.44–1.00)
GFR calc Af Amer: >60 mL/min (ref >60)
GFR calc non Af Amer: 55 mL/min — ABNORMAL LOW (ref >60)
Glucose, Bld: 140 mg/dL — ABNORMAL HIGH (ref 70–99)
Potassium: 4.2 mmol/L (ref 3.5–5.1)
Sodium: 140 mmol/L (ref 135–145)
Total Bilirubin: 0.4 mg/dL (ref 0.3–1.2)
Total Protein: 6.7 g/dL (ref 6.5–8.1)

## 2018-11-04 LAB — CBC WITH DIFFERENTIAL/PLATELET
Abs Immature Granulocytes: 0.02 10*3/uL (ref 0.00–0.07)
Basophils Absolute: 0.1 10*3/uL (ref 0.0–0.1)
Basophils Relative: 1 %
Eosinophils Absolute: 0.3 10*3/uL (ref 0.0–0.5)
Eosinophils Relative: 4 %
HCT: 43.9 % (ref 36.0–46.0)
Hemoglobin: 13.6 g/dL (ref 12.0–15.0)
Immature Granulocytes: 0 %
Lymphocytes Relative: 30 %
Lymphs Abs: 2.4 10*3/uL (ref 0.7–4.0)
MCH: 28.4 pg (ref 26.0–34.0)
MCHC: 31 g/dL (ref 30.0–36.0)
MCV: 91.6 fL (ref 80.0–100.0)
Monocytes Absolute: 0.6 10*3/uL (ref 0.1–1.0)
Monocytes Relative: 7 %
Neutro Abs: 4.7 10*3/uL (ref 1.7–7.7)
Neutrophils Relative %: 58 %
Platelets: 252 10*3/uL (ref 150–400)
RBC: 4.79 MIL/uL (ref 3.87–5.11)
RDW: 13.3 % (ref 11.5–15.5)
WBC: 8.1 10*3/uL (ref 4.0–10.5)
nRBC: 0 % (ref 0.0–0.2)

## 2018-11-04 LAB — PROTIME-INR
INR: 1.02
Prothrombin Time: 13.3 seconds (ref 11.4–15.2)

## 2018-11-04 LAB — APTT: aPTT: 31 seconds (ref 24–36)

## 2018-11-04 LAB — TYPE AND SCREEN
ABO/RH(D): A POS
Antibody Screen: NEGATIVE

## 2018-11-04 LAB — ABO/RH: ABO/RH(D): A POS

## 2018-11-04 NOTE — Progress Notes (Signed)
PCP - Dr. Madilyn Fireman Cardiologist - denies  Chest x-ray - N/A EKG - 09/23/18 Stress Test - denies ECHO - denies Cardiac Cath - denies  Sleep Study - patient had sleep study 10/15/18 but has not received results from her physician.  Aspirin Instructions: patient told to hold all ASA/NSAIDS 7 days prior to DOS.  Anesthesia review: no  Patient denies shortness of breath, fever, cough and chest pain at PAT appointment   Patient verbalized understanding of instructions that were given to them at the PAT appointment. Patient was also instructed that they will need to review over the PAT instructions again at home before surgery.

## 2018-11-05 LAB — URINE CULTURE

## 2018-11-11 ENCOUNTER — Encounter: Payer: Self-pay | Admitting: Family Medicine

## 2018-11-11 DIAGNOSIS — G4733 Obstructive sleep apnea (adult) (pediatric): Secondary | ICD-10-CM

## 2018-11-11 HISTORY — DX: Obstructive sleep apnea (adult) (pediatric): G47.33

## 2018-11-11 MED ORDER — BUPIVACAINE LIPOSOME 1.3 % IJ SUSP
20.0000 mL | Freq: Once | INTRAMUSCULAR | Status: DC
  Filled 2018-11-11: qty 20

## 2018-11-11 MED ORDER — TRANEXAMIC ACID-NACL 1000-0.7 MG/100ML-% IV SOLN
1000.0000 mg | INTRAVENOUS | Status: DC
  Filled 2018-11-11: qty 100

## 2018-11-13 NOTE — Anesthesia Preprocedure Evaluation (Addendum)
Anesthesia Evaluation  Patient identified by MRN, date of birth, ID band Patient awake    Reviewed: Allergy & Precautions, H&P , NPO status , Patient's Chart, lab work & pertinent test results  Airway Mallampati: II  TM Distance: >3 FB Neck ROM: Full    Dental no notable dental hx. (+) Teeth Intact, Dental Advisory Given   Pulmonary sleep apnea , former smoker,    Pulmonary exam normal breath sounds clear to auscultation       Cardiovascular Exercise Tolerance: Good negative cardio ROS Normal cardiovascular exam Rhythm:Regular Rate:Normal     Neuro/Psych PSYCHIATRIC DISORDERS Depression negative neurological ROS     GI/Hepatic negative GI ROS, Neg liver ROS,   Endo/Other  negative endocrine ROS  Renal/GU Renal InsufficiencyRenal disease     Musculoskeletal negative musculoskeletal ROS (+) Arthritis ,   Abdominal (+) + obese,   Peds  Hematology negative hematology ROS (+)   Anesthesia Other Findings   Reproductive/Obstetrics negative OB ROS                             Lab Results  Component Value Date   WBC 8.1 11/04/2018   HGB 13.6 11/04/2018   HCT 43.9 11/04/2018   MCV 91.6 11/04/2018   PLT 252 11/04/2018    Anesthesia Physical Anesthesia Plan  ASA: II  Anesthesia Plan: Spinal   Post-op Pain Management:  Regional for Post-op pain   Induction:   PONV Risk Score and Plan: 2 and Treatment may vary due to age or medical condition, Ondansetron and Dexamethasone  Airway Management Planned: Natural Airway  Additional Equipment:   Intra-op Plan:   Post-operative Plan:   Informed Consent: I have reviewed the patients History and Physical, chart, labs and discussed the procedure including the risks, benefits and alternatives for the proposed anesthesia with the patient or authorized representative who has indicated his/her understanding and acceptance.   Dental advisory  given  Plan Discussed with:   Anesthesia Plan Comments:         Anesthesia Quick Evaluation

## 2018-11-14 ENCOUNTER — Inpatient Hospital Stay (HOSPITAL_COMMUNITY): Payer: Commercial Managed Care - PPO | Admitting: Vascular Surgery

## 2018-11-14 ENCOUNTER — Encounter (HOSPITAL_COMMUNITY)
Admission: RE | Disposition: A | Discharge status: Home / Self Care | Payer: Self-pay | Source: Ambulatory Visit | Attending: Orthopedic Surgery

## 2018-11-14 ENCOUNTER — Other Ambulatory Visit: Payer: Self-pay

## 2018-11-14 ENCOUNTER — Inpatient Hospital Stay (HOSPITAL_COMMUNITY)
Admission: RE | Admit: 2018-11-14 | Discharge: 2018-11-16 | DRG: 470 | Disposition: A | Discharge status: Home / Self Care | Payer: Commercial Managed Care - PPO | Source: Ambulatory Visit | Attending: Orthopedic Surgery | Admitting: Orthopedic Surgery

## 2018-11-14 ENCOUNTER — Encounter (HOSPITAL_COMMUNITY): Payer: Self-pay | Admitting: *Deleted

## 2018-11-14 DIAGNOSIS — Z7982 Long term (current) use of aspirin: Secondary | ICD-10-CM | POA: Diagnosis not present

## 2018-11-14 DIAGNOSIS — Z6836 Body mass index (BMI) 36.0-36.9, adult: Secondary | ICD-10-CM

## 2018-11-14 DIAGNOSIS — E785 Hyperlipidemia, unspecified: Secondary | ICD-10-CM | POA: Diagnosis present

## 2018-11-14 DIAGNOSIS — Z87891 Personal history of nicotine dependence: Secondary | ICD-10-CM | POA: Diagnosis not present

## 2018-11-14 DIAGNOSIS — Z885 Allergy status to narcotic agent status: Secondary | ICD-10-CM

## 2018-11-14 DIAGNOSIS — G4730 Sleep apnea, unspecified: Secondary | ICD-10-CM | POA: Diagnosis present

## 2018-11-14 DIAGNOSIS — N183 Chronic kidney disease, stage 3 (moderate): Secondary | ICD-10-CM | POA: Diagnosis present

## 2018-11-14 DIAGNOSIS — R7303 Prediabetes: Secondary | ICD-10-CM | POA: Diagnosis present

## 2018-11-14 DIAGNOSIS — Z79899 Other long term (current) drug therapy: Secondary | ICD-10-CM | POA: Diagnosis not present

## 2018-11-14 DIAGNOSIS — F339 Major depressive disorder, recurrent, unspecified: Secondary | ICD-10-CM | POA: Diagnosis present

## 2018-11-14 DIAGNOSIS — E669 Obesity, unspecified: Secondary | ICD-10-CM | POA: Diagnosis present

## 2018-11-14 DIAGNOSIS — M1712 Unilateral primary osteoarthritis, left knee: Principal | ICD-10-CM | POA: Diagnosis present

## 2018-11-14 DIAGNOSIS — N1832 Chronic kidney disease, stage 3b: Secondary | ICD-10-CM | POA: Diagnosis present

## 2018-11-14 HISTORY — PX: TOTAL KNEE ARTHROPLASTY: SHX125

## 2018-11-14 HISTORY — DX: Unilateral primary osteoarthritis, left knee: M17.12

## 2018-11-14 SURGERY — ARTHROPLASTY, KNEE, TOTAL
Anesthesia: Spinal | Site: Knee | Laterality: Left

## 2018-11-14 MED ORDER — SODIUM CHLORIDE 0.9 % IV SOLN
INTRAVENOUS | Status: DC | PRN
  Administered 2018-11-14: 11:00:00 via INTRAVENOUS

## 2018-11-14 MED ORDER — HYDROMORPHONE HCL 1 MG/ML IJ SOLN
0.5000 mg | INTRAMUSCULAR | Status: DC | PRN
  Administered 2018-11-15: 1 mg via INTRAVENOUS
  Filled 2018-11-14: qty 1

## 2018-11-14 MED ORDER — MIDAZOLAM HCL 2 MG/2ML IJ SOLN: 1.0000 mg | Freq: Once | INTRAMUSCULAR | Status: DC

## 2018-11-14 MED ORDER — METOCLOPRAMIDE HCL 5 MG/ML IJ SOLN: 5.0000 mg | Freq: Three times a day (TID) | INTRAMUSCULAR | Status: DC | PRN

## 2018-11-14 MED ORDER — ALUM & MAG HYDROXIDE-SIMETH 200-200-20 MG/5ML PO SUSP: 30.0000 mL | ORAL | Status: DC | PRN

## 2018-11-14 MED ORDER — 0.9 % SODIUM CHLORIDE (POUR BTL) OPTIME
TOPICAL | Status: DC | PRN
  Administered 2018-11-14: 1000 mL

## 2018-11-14 MED ORDER — PROPOFOL 1000 MG/100ML IV EMUL
INTRAVENOUS | Status: AC
  Filled 2018-11-14: qty 100

## 2018-11-14 MED ORDER — OXYCODONE HCL 5 MG PO TABS
5.0000 mg | ORAL_TABLET | ORAL | Status: DC | PRN
  Administered 2018-11-14: 5 mg via ORAL
  Administered 2018-11-14 – 2018-11-15 (×4): 10 mg via ORAL
  Administered 2018-11-16 (×2): 5 mg via ORAL
  Filled 2018-11-14 (×4): qty 2
  Filled 2018-11-14 (×2): qty 1
  Filled 2018-11-14: qty 2
  Filled 2018-11-14: qty 1

## 2018-11-14 MED ORDER — BUPIVACAINE IN DEXTROSE 0.75-8.25 % IT SOLN
INTRATHECAL | Status: DC | PRN
  Administered 2018-11-14: 2 mL via INTRATHECAL

## 2018-11-14 MED ORDER — POLYETHYLENE GLYCOL 3350 17 G PO PACK
17.0000 g | PACK | Freq: Two times a day (BID) | ORAL | Status: DC
  Administered 2018-11-15 – 2018-11-16 (×3): 17 g via ORAL
  Filled 2018-11-14 (×3): qty 1

## 2018-11-14 MED ORDER — BUPIVACAINE LIPOSOME 1.3 % IJ SUSP
INTRAMUSCULAR | Status: DC | PRN
  Administered 2018-11-14: 20 mL

## 2018-11-14 MED ORDER — CEFUROXIME SODIUM 1.5 G IV SOLR
INTRAVENOUS | Status: DC | PRN
  Administered 2018-11-14: 1.5 g via INTRAVENOUS

## 2018-11-14 MED ORDER — METOCLOPRAMIDE HCL 5 MG PO TABS: 5.0000 mg | ORAL_TABLET | Freq: Three times a day (TID) | ORAL | Status: DC | PRN

## 2018-11-14 MED ORDER — CHLORHEXIDINE GLUCONATE 4 % EX LIQD: 60.0000 mL | Freq: Once | CUTANEOUS | Status: DC

## 2018-11-14 MED ORDER — DOCUSATE SODIUM 100 MG PO CAPS
100.0000 mg | ORAL_CAPSULE | Freq: Two times a day (BID) | ORAL | Status: DC
  Administered 2018-11-14 – 2018-11-16 (×5): 100 mg via ORAL
  Filled 2018-11-14 (×5): qty 1

## 2018-11-14 MED ORDER — SODIUM CHLORIDE 0.9 % IR SOLN
Status: DC | PRN
  Administered 2018-11-14: 3000 mL

## 2018-11-14 MED ORDER — POVIDONE-IODINE 7.5 % EX SOLN
Freq: Once | CUTANEOUS | Status: DC
  Filled 2018-11-14: qty 118

## 2018-11-14 MED ORDER — PAROXETINE HCL 30 MG PO TABS
30.0000 mg | ORAL_TABLET | Freq: Every day | ORAL | Status: DC
  Administered 2018-11-15 – 2018-11-16 (×2): 30 mg via ORAL
  Filled 2018-11-14 (×2): qty 1

## 2018-11-14 MED ORDER — ONDANSETRON HCL 4 MG/2ML IJ SOLN
INTRAMUSCULAR | Status: DC | PRN
  Administered 2018-11-14: 4 mg via INTRAVENOUS

## 2018-11-14 MED ORDER — ROPIVACAINE HCL 5 MG/ML IJ SOLN
INTRAMUSCULAR | Status: DC | PRN
  Administered 2018-11-14: 30 mL via PERINEURAL

## 2018-11-14 MED ORDER — BUPIVACAINE-EPINEPHRINE 0.5% -1:200000 IJ SOLN
INTRAMUSCULAR | Status: AC
  Filled 2018-11-14: qty 1

## 2018-11-14 MED ORDER — SODIUM CHLORIDE FLUSH 0.9 % IV SOLN
INTRAVENOUS | Status: AC
  Filled 2018-11-14: qty 50

## 2018-11-14 MED ORDER — FENTANYL CITRATE (PF) 100 MCG/2ML IJ SOLN
50.0000 ug | Freq: Once | INTRAMUSCULAR | Status: AC
  Administered 2018-11-14: 50 ug via INTRAVENOUS

## 2018-11-14 MED ORDER — FENTANYL CITRATE (PF) 100 MCG/2ML IJ SOLN
INTRAMUSCULAR | Status: AC
  Administered 2018-11-14: 50 ug
  Filled 2018-11-14: qty 2

## 2018-11-14 MED ORDER — ONDANSETRON HCL 4 MG/2ML IJ SOLN
INTRAMUSCULAR | Status: AC
  Filled 2018-11-14: qty 2

## 2018-11-14 MED ORDER — DEXAMETHASONE SODIUM PHOSPHATE 10 MG/ML IJ SOLN
10.0000 mg | Freq: Three times a day (TID) | INTRAMUSCULAR | Status: AC
  Administered 2018-11-14 – 2018-11-15 (×4): 10 mg via INTRAVENOUS
  Filled 2018-11-14 (×4): qty 1

## 2018-11-14 MED ORDER — ACETAMINOPHEN 500 MG PO TABS
1000.0000 mg | ORAL_TABLET | Freq: Four times a day (QID) | ORAL | Status: AC
  Administered 2018-11-14 – 2018-11-15 (×3): 1000 mg via ORAL
  Filled 2018-11-14 (×3): qty 2

## 2018-11-14 MED ORDER — CEFAZOLIN SODIUM-DEXTROSE 2-4 GM/100ML-% IV SOLN
2.0000 g | Freq: Four times a day (QID) | INTRAVENOUS | Status: AC
  Administered 2018-11-14 (×2): 2 g via INTRAVENOUS
  Filled 2018-11-14 (×2): qty 100

## 2018-11-14 MED ORDER — ACITRETIN 25 MG PO CAPS: 25.0000 mg | ORAL_CAPSULE | ORAL | Status: DC

## 2018-11-14 MED ORDER — CEFUROXIME SODIUM 750 MG IJ SOLR
INTRAMUSCULAR | Status: AC
  Filled 2018-11-14: qty 750

## 2018-11-14 MED ORDER — PHENOL 1.4 % MT LIQD: 1.0000 | OROMUCOSAL | Status: DC | PRN

## 2018-11-14 MED ORDER — TRANEXAMIC ACID-NACL 1000-0.7 MG/100ML-% IV SOLN
1000.0000 mg | Freq: Once | INTRAVENOUS | Status: AC
  Administered 2018-11-14: 1000 mg via INTRAVENOUS
  Filled 2018-11-14: qty 100

## 2018-11-14 MED ORDER — MIDAZOLAM HCL 2 MG/2ML IJ SOLN
INTRAMUSCULAR | Status: AC
  Administered 2018-11-14: 1 mg
  Filled 2018-11-14: qty 2

## 2018-11-14 MED ORDER — ONDANSETRON HCL 4 MG PO TABS: 4.0000 mg | ORAL_TABLET | Freq: Four times a day (QID) | ORAL | Status: DC | PRN

## 2018-11-14 MED ORDER — BUPIVACAINE-EPINEPHRINE 0.5% -1:200000 IJ SOLN
INTRAMUSCULAR | Status: DC | PRN
  Administered 2018-11-14: 50 mL

## 2018-11-14 MED ORDER — SODIUM CHLORIDE 0.9 % IV SOLN
INTRAVENOUS | Status: DC | PRN
  Administered 2018-11-14: 25 ug/min via INTRAVENOUS

## 2018-11-14 MED ORDER — GABAPENTIN 300 MG PO CAPS
300.0000 mg | ORAL_CAPSULE | Freq: Every day | ORAL | Status: DC
  Administered 2018-11-14 – 2018-11-15 (×2): 300 mg via ORAL
  Filled 2018-11-14 (×2): qty 1

## 2018-11-14 MED ORDER — ATORVASTATIN CALCIUM 10 MG PO TABS
20.0000 mg | ORAL_TABLET | Freq: Every day | ORAL | Status: DC
  Administered 2018-11-14 – 2018-11-15 (×2): 20 mg via ORAL
  Filled 2018-11-14 (×3): qty 2

## 2018-11-14 MED ORDER — MENTHOL 3 MG MT LOZG: 1.0000 | LOZENGE | OROMUCOSAL | Status: DC | PRN

## 2018-11-14 MED ORDER — ASPIRIN EC 325 MG PO TBEC
325.0000 mg | DELAYED_RELEASE_TABLET | Freq: Every day | ORAL | Status: DC
  Administered 2018-11-15 – 2018-11-16 (×2): 325 mg via ORAL
  Filled 2018-11-14 (×2): qty 1

## 2018-11-14 MED ORDER — DIPHENHYDRAMINE HCL 12.5 MG/5ML PO ELIX: 12.5000 mg | ORAL_SOLUTION | ORAL | Status: DC | PRN

## 2018-11-14 MED ORDER — DEXAMETHASONE SODIUM PHOSPHATE 10 MG/ML IJ SOLN
INTRAMUSCULAR | Status: DC | PRN
  Administered 2018-11-14: 10 mg via INTRAVENOUS

## 2018-11-14 MED ORDER — PROPOFOL 500 MG/50ML IV EMUL
INTRAVENOUS | Status: DC | PRN
  Administered 2018-11-14: 75 ug/kg/min via INTRAVENOUS

## 2018-11-14 MED ORDER — POTASSIUM CHLORIDE IN NACL 20-0.9 MEQ/L-% IV SOLN
INTRAVENOUS | Status: DC
  Administered 2018-11-14: 15:00:00 via INTRAVENOUS
  Filled 2018-11-14: qty 1000

## 2018-11-14 MED ORDER — CEFAZOLIN SODIUM-DEXTROSE 2-4 GM/100ML-% IV SOLN
2.0000 g | INTRAVENOUS | Status: AC
  Administered 2018-11-14: 2 g via INTRAVENOUS
  Filled 2018-11-14: qty 100

## 2018-11-14 MED ORDER — MIDAZOLAM HCL 2 MG/2ML IJ SOLN
INTRAMUSCULAR | Status: AC
  Filled 2018-11-14: qty 2

## 2018-11-14 MED ORDER — PROPOFOL 10 MG/ML IV BOLUS
INTRAVENOUS | Status: DC | PRN
  Administered 2018-11-14: 30 mg via INTRAVENOUS

## 2018-11-14 MED ORDER — SODIUM CHLORIDE (PF) 0.9 % IJ SOLN
INTRAMUSCULAR | Status: DC | PRN
  Administered 2018-11-14: 50 mL via INTRAVENOUS

## 2018-11-14 MED ORDER — DEXAMETHASONE SODIUM PHOSPHATE 10 MG/ML IJ SOLN
INTRAMUSCULAR | Status: AC
  Filled 2018-11-14: qty 1

## 2018-11-14 MED ORDER — PROPOFOL 500 MG/50ML IV EMUL
INTRAVENOUS | Status: AC
  Filled 2018-11-14: qty 50

## 2018-11-14 MED ORDER — ONDANSETRON HCL 4 MG/2ML IJ SOLN: 4.0000 mg | Freq: Four times a day (QID) | INTRAMUSCULAR | Status: DC | PRN

## 2018-11-14 MED ORDER — LACTATED RINGERS IV SOLN
INTRAVENOUS | Status: DC
  Administered 2018-11-14: 08:00:00 via INTRAVENOUS

## 2018-11-14 MED ORDER — FENTANYL CITRATE (PF) 250 MCG/5ML IJ SOLN
INTRAMUSCULAR | Status: AC
  Filled 2018-11-14: qty 5

## 2018-11-14 SURGICAL SUPPLY — 81 items
APL SKNCLS STERI-STRIP NONHPOA (GAUZE/BANDAGES/DRESSINGS) ×1
ATTUNE PSFEM LTSZ6 NARCEM KNEE (Femur) ×1 IMPLANT
ATTUNE PSRP INSR SZ6 6 KNEE (Insert) ×2 IMPLANT
BANDAGE ESMARK 6X9 LF (GAUZE/BANDAGES/DRESSINGS) ×1 IMPLANT
BASE TIBIA ATTUNE KNEE SYS SZ6 (Knees) IMPLANT
BENZOIN TINCTURE PRP APPL 2/3 (GAUZE/BANDAGES/DRESSINGS) ×2 IMPLANT
BLADE SAGITTAL 25.0X1.19X90 (BLADE) ×2 IMPLANT
BLADE SAW SGTL 13X75X1.27 (BLADE) ×2 IMPLANT
BLADE SURG 10 STRL SS (BLADE) ×6 IMPLANT
BNDG CMPR 9X6 STRL LF SNTH (GAUZE/BANDAGES/DRESSINGS) ×1
BNDG CMPR MED 15X6 ELC VLCR LF (GAUZE/BANDAGES/DRESSINGS) ×1
BNDG COHESIVE 3X5 TAN STRL LF (GAUZE/BANDAGES/DRESSINGS) ×1 IMPLANT
BNDG ELASTIC 6X15 VLCR STRL LF (GAUZE/BANDAGES/DRESSINGS) ×2 IMPLANT
BNDG ESMARK 6X9 LF (GAUZE/BANDAGES/DRESSINGS) ×2
BOWL SMART MIX CTS (DISPOSABLE) ×2 IMPLANT
BSPLAT TIB 6 CMNT ROT PLAT STR (Knees) ×1 IMPLANT
CEMENT HV SMART SET (Cement) ×4 IMPLANT
CLSR STERI-STRIP ANTIMIC 1/2X4 (GAUZE/BANDAGES/DRESSINGS) ×1 IMPLANT
COVER SURGICAL LIGHT HANDLE (MISCELLANEOUS) ×2 IMPLANT
COVER WAND RF STERILE (DRAPES) ×2 IMPLANT
CUFF TOURNIQUET SINGLE 34IN LL (TOURNIQUET CUFF) ×2 IMPLANT
CUFF TOURNIQUET SINGLE 44IN (TOURNIQUET CUFF) IMPLANT
DECANTER SPIKE VIAL GLASS SM (MISCELLANEOUS) ×2 IMPLANT
DRAPE EXTREMITY T 121X128X90 (DRAPE) ×2 IMPLANT
DRAPE HALF SHEET 40X57 (DRAPES) ×2 IMPLANT
DRAPE INCISE IOBAN 66X45 STRL (DRAPES) IMPLANT
DRAPE ORTHO SPLIT 77X108 STRL (DRAPES) ×2
DRAPE SURG ORHT 6 SPLT 77X108 (DRAPES) ×1 IMPLANT
DRAPE U-SHAPE 47X51 STRL (DRAPES) ×2 IMPLANT
DRSG AQUACEL AG ADV 3.5X10 (GAUZE/BANDAGES/DRESSINGS) ×2 IMPLANT
DURAPREP 26ML APPLICATOR (WOUND CARE) ×2 IMPLANT
ELECT CAUTERY BLADE 6.4 (BLADE) ×2 IMPLANT
ELECT REM PT RETURN 9FT ADLT (ELECTROSURGICAL) ×2
ELECTRODE REM PT RTRN 9FT ADLT (ELECTROSURGICAL) ×1 IMPLANT
FACESHIELD WRAPAROUND (MASK) ×4 IMPLANT
GLOVE BIO SURGEON STRL SZ7 (GLOVE) ×2 IMPLANT
GLOVE BIOGEL PI IND STRL 7.0 (GLOVE) ×1 IMPLANT
GLOVE BIOGEL PI IND STRL 7.5 (GLOVE) ×1 IMPLANT
GLOVE BIOGEL PI INDICATOR 7.0 (GLOVE) ×1
GLOVE BIOGEL PI INDICATOR 7.5 (GLOVE) ×1
GLOVE SS BIOGEL STRL SZ 7.5 (GLOVE) ×1 IMPLANT
GLOVE SUPERSENSE BIOGEL SZ 7.5 (GLOVE) ×1
GOWN STRL REUS W/ TWL LRG LVL3 (GOWN DISPOSABLE) ×1 IMPLANT
GOWN STRL REUS W/ TWL XL LVL3 (GOWN DISPOSABLE) ×1 IMPLANT
GOWN STRL REUS W/TWL LRG LVL3 (GOWN DISPOSABLE) ×4
GOWN STRL REUS W/TWL XL LVL3 (GOWN DISPOSABLE) ×4
HANDPIECE INTERPULSE COAX TIP (DISPOSABLE) ×2
HOOD PEEL AWAY FACE SHEILD DIS (HOOD) ×4 IMPLANT
IMMOBILIZER KNEE 22 UNIV (SOFTGOODS) ×2 IMPLANT
KIT BASIN OR (CUSTOM PROCEDURE TRAY) ×2 IMPLANT
KIT TURNOVER KIT B (KITS) ×2 IMPLANT
MANIFOLD NEPTUNE II (INSTRUMENTS) ×2 IMPLANT
MARKER SKIN DUAL TIP RULER LAB (MISCELLANEOUS) ×2 IMPLANT
NEEDLE HYPO 22GX1.5 SAFETY (NEEDLE) ×4 IMPLANT
NS IRRIG 1000ML POUR BTL (IV SOLUTION) ×2 IMPLANT
PACK TOTAL JOINT (CUSTOM PROCEDURE TRAY) ×2 IMPLANT
PAD ARMBOARD 7.5X6 YLW CONV (MISCELLANEOUS) ×3 IMPLANT
PATELLA MEDIAL ATTUN 35MM KNEE (Knees) ×1 IMPLANT
PIN STEINMAN FIXATION KNEE (PIN) ×2 IMPLANT
PIN THREADED HEADED SIGMA (PIN) ×1 IMPLANT
SET HNDPC FAN SPRY TIP SCT (DISPOSABLE) ×1 IMPLANT
STRIP CLOSURE SKIN 1/2X4 (GAUZE/BANDAGES/DRESSINGS) ×2 IMPLANT
SUCTION FRAZIER HANDLE 10FR (MISCELLANEOUS) ×1
SUCTION TUBE FRAZIER 10FR DISP (MISCELLANEOUS) ×1 IMPLANT
SUT MNCRL AB 3-0 PS2 18 (SUTURE) ×2 IMPLANT
SUT VIC AB 0 CT1 27 (SUTURE) ×6
SUT VIC AB 0 CT1 27XBRD ANBCTR (SUTURE) ×3 IMPLANT
SUT VIC AB 1 CT1 27 (SUTURE) ×2
SUT VIC AB 1 CT1 27XBRD ANBCTR (SUTURE) ×1 IMPLANT
SUT VIC AB 2-0 CT1 27 (SUTURE) ×4
SUT VIC AB 2-0 CT1 TAPERPNT 27 (SUTURE) ×2 IMPLANT
SYR CONTROL 10ML LL (SYRINGE) ×4 IMPLANT
TIBIA ATTUNE KNEE SYS BASE SZ6 (Knees) ×2 IMPLANT
TOWEL OR 17X24 6PK STRL BLUE (TOWEL DISPOSABLE) ×2 IMPLANT
TOWEL OR 17X26 10 PK STRL BLUE (TOWEL DISPOSABLE) ×2 IMPLANT
TRAY CATH 16FR W/PLASTIC CATH (SET/KITS/TRAYS/PACK) IMPLANT
TRAY FOLEY CATH SILVER 16FR (SET/KITS/TRAYS/PACK) ×1 IMPLANT
TRAY FOLEY W/BAG SLVR 16FR (SET/KITS/TRAYS/PACK) ×2
TRAY FOLEY W/BAG SLVR 16FR ST (SET/KITS/TRAYS/PACK) IMPLANT
WATER STERILE IRR 1000ML POUR (IV SOLUTION) ×2 IMPLANT
YANKAUER SUCT BULB TIP NO VENT (SUCTIONS) ×2 IMPLANT

## 2018-11-14 NOTE — Progress Notes (Signed)
1300 Received pt from PACU, A&O x4. LLE with ace wrap dry and intact. CPM on upon arrival to the room. Left foot is numb, denies pain.

## 2018-11-14 NOTE — Anesthesia Procedure Notes (Signed)
Date/Time: 11/14/2018 9:10 AM Performed by: Alain Marion, CRNA Pre-anesthesia Checklist: Patient identified, Emergency Drugs available, Suction available and Timeout performed Oxygen Delivery Method: Simple face mask Placement Confirmation: positive ETCO2

## 2018-11-14 NOTE — Interval H&P Note (Signed)
History and Physical Interval Note:  11/14/2018 7:14 AM  Yvonne Long  has presented today for surgery, with the diagnosis of djd left knee  The various methods of treatment have been discussed with the patient and family. After consideration of risks, benefits and other options for treatment, the patient has consented to  Procedure(s): TOTAL KNEE ARTHROPLASTY (Left) as a surgical intervention .  The patient's history has been reviewed, patient examined, no change in status, stable for surgery.  I have reviewed the patient's chart and labs.  Questions were answered to the patient's satisfaction.     Lorn Junes

## 2018-11-14 NOTE — Op Note (Signed)
MRN:     [id number] DOB/AGE:    June 02, 1951 / 67 y.o.       OPERATIVE REPORT   DATE OF PROCEDURE:  11/14/2018      PREOPERATIVE DIAGNOSIS:   Primary Localized Osteoarthritis left Knee       Estimated body mass index is 34.54 kg/m as calculated from the following:   Height as of this encounter: 5\' 6"  (1.676 m).   Weight as of this encounter: 97.1 kg.                                                       POSTOPERATIVE DIAGNOSIS:   Same                                                                 PROCEDURE:  Procedure(s): TOTAL KNEE ARTHROPLASTY Using Depuy Attune RP implants #6 narrow Femur, #6Tibia, 35mm  RP bearing, 35 Patella    SURGEON: Carmichael Burdette A. Noemi Chapel, MD   ASSISTANT: Matthew Saras, PA-C, present and scrubbed throughout the case, critical for retraction, instrumentation, and closure.  ANESTHESIA: Spinal with Adductor Nerve Block  TOURNIQUET TIME: 58 minutes   COMPLICATIONS:  None       SPECIMENS: None   INDICATIONS FOR PROCEDURE: The patient has djd of the knee with varus deformities, XR shows bone on bone arthritis. Patient has failed all conservative measures including anti-inflammatory medicines, narcotics, attempts at exercise and weight loss, cortisone injections and viscosupplementation.  Risks and benefits of surgery have been discussed, questions answered.    DESCRIPTION OF PROCEDURE: The patient identified by armband, received right adductor canal block and IV antibiotics, in the holding area at [facility]. Patient taken to the operating room, appropriate anesthetic monitors were attached. Spinal anesthesia induced with the patient in supine position, Foley catheter was inserted. Tourniquet applied high to the operative thigh. Lateral post and foot positioner applied to the table, the lower extremity was then prepped and draped in usual sterile fashion from the ankle to the tourniquet. Time-out procedure was performed. The limb was wrapped with an Esmarch  bandage and the tourniquet inflated to 365 mmHg.   We began the operation by making a 6cm anterior midline incision. Small bleeders in the skin and the subcutaneous tissue identified and cauterized. Transverse retinaculum was incised and reflected medially and a medial parapatellar arthrotomy was accomplished. the patella was everted and theprepatellar fat pad resected. The superficial medial collateral ligament was then elevated from anterior to posterior along the proximal flare of the tibia and anterior half of the menisci resected. The knee was hyperflexed exposing bone on bone arthritis. Peripheral and notch osteophytes as well as the cruciate ligaments were then resected. We continued to work our way around posteriorly along the proximal tibia, and externally rotated the tibia subluxing it out from underneath the femur. A McHale retractor was placed through the notch and a lateral Hohmann retractor placed, and an external tibial guide was placed.  The tibial cutting guide was pinned into place allowing resection of 4 mm of bone medially and about 6 mm of bone laterally because of  her varus deformity.   Satisfied with the tibial resection, we then entered the distal femur 2 mm anterior to the PCL origin with the intramedullary guide rod and applied the distal femoral cutting guide set at 16mm, with 5 degrees of valgus. This was pinned along the epicondylar axis. At this point, the distal femoral cut was accomplished without difficulty. We then sized for a 6 narrow femoral component and pinned the guide in 3 degrees of external rotation.The chamfer cutting guide was pinned into place. The anterior, posterior, and chamfer cuts were accomplished without difficulty followed by the  RP box cutting guide and the box cut. We also removed posterior osteophytes from the posterior femoral condyles. At this time, the knee was brought into full extension. We checked our extension and flexion gaps and found them  symmetric at 6.  The patella thickness measured at 33m m. We set the cutting guide at 15 and removed the posterior patella sized for 35 button and drilled the lollipop. The knee was then once again hyperflexed exposing the proximal tibia. We sized for a # 6 tibial base plate, applied the smokestack and the conical reamer followed by the the Delta fin keel punch. We then hammered into place the  RP trial femoral component, inserted a trial bearing, trial patellar button, and took the knee through range of motion from 0-130 degrees. No thumb pressure was required for patellar tracking.   At this point, all trial components were removed, a double batch of DePuy HV cement with Zinecef 1.5gms was mixed and applied to all bony metallic mating surfaces. In order, we hammered into place the tibial tray and removed excess cement, the femoral component and removed excess cement, a 6 mm  RP bearing was inserted, and the knee brought to full extension with compression. The patellar button was clamped into place, and excess cement removed. While the cement cured the wound was irrigated out with normal saline solution pulse lavage, and exparel was injected throughout the knee. Ligament stability and patellar tracking were checked and found to be excellent..   The parapatellar arthrotomy was closed with  #1 Vicryl suture. The subcutaneous tissue with 0 and 2-0 undyed Vicryl suture, and 4-0 Monocryl.. A dressing of Aquaseal, 4 x 4, dressing sponges, Webril, and Ace wrap applied. Needle and sponge count were correct times 2.The patient awakened, extubated, and taken to recovery room without difficulty. Vascular status was normal, pulses 2+ and symmetric.    Lorn Junes 03/14/2018, 8:56 AM

## 2018-11-14 NOTE — Transfer of Care (Signed)
Immediate Anesthesia Transfer of Care Note  Patient: Yvonne Long  Procedure(s) Performed: left TOTAL KNEE ARTHROPLASTY (Left Knee)  Patient Location: PACU  Anesthesia Type:MAC combined with regional for post-op pain  Level of Consciousness: awake, alert  and oriented  Airway & Oxygen Therapy: Patient Spontanous Breathing and Patient connected to nasal cannula oxygen  Post-op Assessment: Report given to RN and Post -op Vital signs reviewed and stable  Post vital signs: Reviewed and stable  Last Vitals:  Vitals Value Taken Time  BP 96/46   Temp 36.2 C 11/14/2018 11:19 AM  Pulse 77 11/14/2018 11:22 AM  Resp 13 11/14/2018 11:22 AM  SpO2 96 % 11/14/2018 11:22 AM  Vitals shown include unvalidated device data.  Last Pain:  Vitals:   11/14/18 1119  PainSc: 0-No pain         Complications: No apparent anesthesia complications

## 2018-11-14 NOTE — Evaluation (Signed)
Physical Therapy Evaluation Patient Details Name: Yvonne Long MRN: [id number] DOB: 06-12-1951 Today's Date: 11/14/2018   History of Present Illness  Pt is a 67 y/o female s/p elective L TKA. PMH includes CKD, major depressive disorder, prediabetes, and lumbar surgery.   Clinical Impression  Pt is s/p surgery above with deficits below. Pt with knee buckling in L knee, even with use of KI, therefore mobility limited to chair. REquired min A for steadying throughout using RW. Reviewed knee precautions and supine HEP. Will continue to follow acutely to maximize functional mobility independence and safety.     Follow Up Recommendations Follow surgeon's recommendation for DC plan and follow-up therapies;Supervision for mobility/OOB    Equipment Recommendations  3in1 (PT)    Recommendations for Other Services OT consult     Precautions / Restrictions Precautions Precautions: Knee Precaution Booklet Issued: Yes (comment) Precaution Comments: Reviewed knee precautions with pt.  Required Braces or Orthoses: Knee Immobilizer - Left Knee Immobilizer - Left: Other (comment)(until good quad control ) Restrictions Weight Bearing Restrictions: Yes LLE Weight Bearing: Weight bearing as tolerated      Mobility  Bed Mobility Overal bed mobility: Needs Assistance Bed Mobility: Supine to Sit     Supine to sit: Supervision     General bed mobility comments: Supervision for line management.   Transfers Overall transfer level: Needs assistance Equipment used: Rolling walker (2 wheeled) Transfers: Sit to/from [facility] Sit to Stand: Min assist Stand pivot transfers: Min assist       General transfer comment: Min A for lift assist and steadying. Verbal cues for safe hand placement. Min A for steadying throughout transfer to chair, as pt with L knee buckling, even with use of KI.   Ambulation/Gait                Stairs            Wheelchair  Mobility    Modified Rankin (Stroke Patients Only)       Balance Overall balance assessment: Needs assistance Sitting-balance support: No upper extremity supported;Feet supported Sitting balance-Leahy Scale: Fair     Standing balance support: Bilateral upper extremity supported;During functional activity Standing balance-Leahy Scale: Poor Standing balance comment: Reliant on BUE support.                              Pertinent Vitals/Pain Pain Assessment: 0-10 Pain Score: 5  Pain Location: L knee  Pain Descriptors / Indicators: Aching;Operative site guarding Pain Intervention(s): Limited activity within patient's tolerance;Monitored during session;Repositioned    Home Living Family/patient expects to be discharged to:: Private residence Living Arrangements: Spouse/significant other Available Help at Discharge: Family;Available 24 hours/day Type of Home: House Home Access: Stairs to enter Entrance Stairs-Rails: None Entrance Stairs-Number of Steps: 3-4 Home Layout: Two level;Able to live on main level with bedroom/bathroom Home Equipment: Gilford Rile - 2 wheels;Cane - single point      Prior Function Level of Independence: Independent               Hand Dominance        Extremity/Trunk Assessment   Upper Extremity Assessment Upper Extremity Assessment: Defer to OT evaluation    Lower Extremity Assessment Lower Extremity Assessment: LLE deficits/detail LLE Deficits / Details: Deficits consistent with post op pain and weakness. Reports increased numbness in LLE.     Cervical / Trunk Assessment Cervical / Trunk Assessment: Normal  Communication   Communication: No difficulties  Cognition Arousal/Alertness: Awake/alert Behavior During Therapy: WFL for tasks assessed/performed Overall Cognitive Status: Within Functional Limits for tasks assessed                                        General Comments      Exercises Total Joint  Exercises Ankle Circles/Pumps: AROM;Both;20 reps Quad Sets: AROM;Left;10 reps Heel Slides: AROM;Left;10 reps   Assessment/Plan    PT Assessment Patient needs continued PT services  PT Problem List Decreased strength;Decreased range of motion;Decreased balance;Decreased mobility;Decreased coordination;Decreased knowledge of use of DME;Decreased knowledge of precautions;Impaired sensation;Pain       PT Treatment Interventions DME instruction;Gait training;Functional mobility training;Therapeutic activities;Therapeutic exercise;Balance training;Patient/family education;Neuromuscular re-education;Stair training    PT Goals (Current goals can be found in the Care Plan section)  Acute Rehab PT Goals Patient Stated Goal: "to get back on my feet"  PT Goal Formulation: With patient Time For Goal Achievement: 11/28/18 Potential to Achieve Goals: Good    Frequency 7X/week   Barriers to discharge        Co-evaluation               AM-PAC PT "6 Clicks" Mobility  Outcome Measure Help needed turning from your back to your side while in a flat bed without using bedrails?: None Help needed moving from lying on your back to sitting on the side of a flat bed without using bedrails?: None Help needed moving to and from a bed to a chair (including a wheelchair)?: A Little Help needed standing up from a chair using your arms (e.g., wheelchair or bedside chair)?: A Little Help needed to walk in hospital room?: A Lot Help needed climbing 3-5 steps with a railing? : A Lot 6 Click Score: 18    End of Session Equipment Utilized During Treatment: Gait belt;Left knee immobilizer Activity Tolerance: Patient tolerated treatment well Patient left: in chair;with call bell/phone within reach Nurse Communication: Mobility status PT Visit Diagnosis: Unsteadiness on feet (R26.81);Muscle weakness (generalized) (M62.81);Other abnormalities of gait and mobility (R26.89)    Time: 1533-1550 PT Time  Calculation (min) (ACUTE ONLY): 17 min   Charges:   PT Evaluation $PT Eval Low Complexity: Joseph, PT, DPT  Acute Rehabilitation Services  Pager: [phone] Office: [phone]   Rudean Hitt 11/14/2018, 4:01 PM

## 2018-11-14 NOTE — Progress Notes (Signed)
Orthopedic Tech Progress Note Patient Details:  Yvonne Long Oct 03, 1951 [id number]  CPM Left Knee CPM Left Knee: On Left Knee Flexion (Degrees): 90 Left Knee Extension (Degrees): 0 Additional Comments: foot roll  Post Interventions Patient Tolerated: Well Instructions Provided: Care of device  [name] 11/14/2018, 11:41 AM

## 2018-11-14 NOTE — Anesthesia Procedure Notes (Addendum)
Anesthesia Regional Block: Adductor canal block   Pre-Anesthetic Checklist: ,, timeout performed, Correct Patient, Correct Site, Correct Laterality, Correct Procedure, Correct Position, site marked, Risks and benefits discussed,  Surgical consent,  Pre-op evaluation,  At surgeon's request and post-op pain management  Laterality: Left  Prep: Maximum Sterile Barrier Precautions used, chloraprep       Needles:  Injection technique: Single-shot  Needle Type: Echogenic Needle     Needle Length: 9cm  Needle Gauge: 21     Additional Needles:   Procedures:,,,, ultrasound used (permanent image in chart),,,,  Narrative:  Start time: 11/14/2018 8:27 AM End time: 11/14/2018 8:36 AM Injection made incrementally with aspirations every 5 mL.  Performed by: Personally  Anesthesiologist: Barnet Glasgow, MD  Additional Notes: 1 attempt.

## 2018-11-14 NOTE — Anesthesia Procedure Notes (Signed)
Spinal  Patient location during procedure: OR Start time: 11/14/2018 9:11 AM End time: 11/14/2018 9:15 AM Staffing Anesthesiologist: Barnet Glasgow, MD Performed: anesthesiologist  Preanesthetic Checklist Completed: patient identified, surgical consent, pre-op evaluation, timeout performed, IV checked, risks and benefits discussed and monitors and equipment checked Spinal Block Patient position: sitting Prep: site prepped and draped and DuraPrep Patient monitoring: heart rate, cardiac monitor, continuous pulse ox and blood pressure Approach: midline Location: L3-4 Injection technique: single-shot Needle Needle type: Pencan  Needle gauge: 24 G Needle length: 10 cm Needle insertion depth: 6 cm Assessment Sensory level: T4 Additional Notes 1 attempt. Patient tolerated procedure well.

## 2018-11-14 NOTE — Anesthesia Postprocedure Evaluation (Signed)
Anesthesia Post Note  Patient: Yvonne Long  Procedure(s) Performed: left TOTAL KNEE ARTHROPLASTY (Left Knee)     Patient location during evaluation: Nursing Unit Anesthesia Type: Spinal Level of consciousness: oriented and awake and alert Pain management: pain level controlled Vital Signs Assessment: post-procedure vital signs reviewed and stable Respiratory status: spontaneous breathing and respiratory function stable Cardiovascular status: blood pressure returned to baseline and stable Postop Assessment: no headache, no backache, no apparent nausea or vomiting and patient able to bend at knees Anesthetic complications: no    Last Vitals:  Vitals:   11/14/18 1247 11/14/18 1248  BP:  127/65  Pulse:  67  Resp:  20  Temp: 36.7 C   SpO2:  98%    Last Pain:  Vitals:   11/14/18 1247  TempSrc: Axillary  PainSc:                  Barnet Glasgow

## 2018-11-15 ENCOUNTER — Encounter (HOSPITAL_COMMUNITY): Payer: Self-pay | Admitting: Orthopedic Surgery

## 2018-11-15 LAB — CBC
HCT: 38.6 % (ref 36.0–46.0)
Hemoglobin: 12 g/dL (ref 12.0–15.0)
MCH: 28.1 pg (ref 26.0–34.0)
MCHC: 31.1 g/dL (ref 30.0–36.0)
MCV: 90.4 fL (ref 80.0–100.0)
Platelets: 247 10*3/uL (ref 150–400)
RBC: 4.27 MIL/uL (ref 3.87–5.11)
RDW: 13.6 % (ref 11.5–15.5)
WBC: 13.6 10*3/uL — ABNORMAL HIGH (ref 4.0–10.5)
nRBC: 0 % (ref 0.0–0.2)

## 2018-11-15 LAB — BASIC METABOLIC PANEL
Anion gap: 8 (ref 5–15)
BUN: 13 mg/dL (ref 8–23)
CO2: 21 mmol/L — ABNORMAL LOW (ref 22–32)
Calcium: 8.5 mg/dL — ABNORMAL LOW (ref 8.9–10.3)
Chloride: 109 mmol/L (ref 98–111)
Creatinine, Ser: 0.97 mg/dL (ref 0.44–1.00)
GFR calc Af Amer: >60 mL/min (ref >60)
GFR calc non Af Amer: 60 mL/min — ABNORMAL LOW (ref >60)
Glucose, Bld: 179 mg/dL — ABNORMAL HIGH (ref 70–99)
Potassium: 4.1 mmol/L (ref 3.5–5.1)
Sodium: 138 mmol/L (ref 135–145)

## 2018-11-15 NOTE — Progress Notes (Signed)
Physical Therapy Treatment Patient Details Name: Yvonne Long MRN: [id number] DOB: 21-Sep-1951 Today's Date: 11/15/2018    History of Present Illness Pt is a 67 y/o female s/p elective L TKA. PMH includes CKD, major depressive disorder, prediabetes, and lumbar surgery.     PT Comments    Patient seen for mobility progression. Pt requires min guard/min A for gait and stair training. L knee buckled once requiring increased assist. Pt needs cues while ambulating for safe use of RW as pt tends to take single hand from RW at times while mobilizing. Continue to progress as tolerated.    Follow Up Recommendations  Follow surgeon's recommendation for DC plan and follow-up therapies;Supervision for mobility/OOB     Equipment Recommendations  3in1 (PT)    Recommendations for Other Services OT consult     Precautions / Restrictions Precautions Precautions: Knee Precaution Comments: Reviewed knee precautions/positioning with pt.  Required Braces or Orthoses: Knee Immobilizer - Left Knee Immobilizer - Left: Other (comment)(not used this session) Restrictions Weight Bearing Restrictions: Yes LLE Weight Bearing: Weight bearing as tolerated    Mobility  Bed Mobility               General bed mobility comments: pt OOB in chair upon arrival  Transfers Overall transfer level: Needs assistance Equipment used: Rolling walker (2 wheeled) Transfers: Sit to/from Stand Sit to Stand: Min guard         General transfer comment: min guard for safety; cues for safe hand placement  Ambulation/Gait Ambulation/Gait assistance: Min guard;Min assist Gait Distance (Feet): 160 Feet Assistive device: Rolling walker (2 wheeled) Gait Pattern/deviations: Step-through pattern;Decreased stride length Gait velocity: decreased   General Gait Details: cues for L heel strike/toe off and L quad activation during stance phase; L knee buckled once requiring increased assist for safety; cues  for safe use of RW as pt tends to take single UE off of RW while ambulating    Stairs Stairs: Yes Stairs assistance: Min assist Stair Management: No rails;Step to pattern;Backwards;With walker Number of Stairs: 2 General stair comments: cues for sequencing and technique; assist to stabilize RW; stair handout given    Wheelchair Mobility    Modified Rankin (Stroke Patients Only)       Balance Overall balance assessment: Needs assistance Sitting-balance support: No upper extremity supported;Feet supported Sitting balance-Leahy Scale: Fair     Standing balance support: Bilateral upper extremity supported;During functional activity Standing balance-Leahy Scale: Poor                              Cognition Arousal/Alertness: Awake/alert Behavior During Therapy: WFL for tasks assessed/performed Overall Cognitive Status: Within Functional Limits for tasks assessed                                        Exercises      General Comments        Pertinent Vitals/Pain Pain Assessment: Faces Faces Pain Scale: Hurts little more Pain Location: L knee  Pain Descriptors / Indicators: Guarding;Sore Pain Intervention(s): Limited activity within patient's tolerance;Monitored during session;Premedicated before session;Repositioned    Home Living                      Prior Function            PT Goals (current goals can now be found  in the care plan section) Progress towards PT goals: Progressing toward goals    Frequency    7X/week      PT Plan Current plan remains appropriate    Co-evaluation              AM-PAC PT "6 Clicks" Mobility   Outcome Measure  Help needed turning from your back to your side while in a flat bed without using bedrails?: None Help needed moving from lying on your back to sitting on the side of a flat bed without using bedrails?: None Help needed moving to and from a bed to a chair (including a  wheelchair)?: A Little Help needed standing up from a chair using your arms (e.g., wheelchair or bedside chair)?: A Little Help needed to walk in hospital room?: A Little Help needed climbing 3-5 steps with a railing? : A Little 6 Click Score: 20    End of Session Equipment Utilized During Treatment: Gait belt Activity Tolerance: Patient tolerated treatment well Patient left: in chair;with call bell/phone within reach;with family/visitor present;Other (comment)(OT present) Nurse Communication: Mobility status PT Visit Diagnosis: Unsteadiness on feet (R26.81);Muscle weakness (generalized) (M62.81);Other abnormalities of gait and mobility (R26.89)     Time: 6629-4765 PT Time Calculation (min) (ACUTE ONLY): 16 min  Charges:  $Gait Training: 8-22 mins                     Earney Navy, PTA Acute Rehabilitation Services Pager: [phone] Office: [phone]     Darliss Cheney 11/15/2018, 11:54 AM

## 2018-11-15 NOTE — Progress Notes (Signed)
Physical Therapy Treatment Patient Details Name: Yvonne Long MRN: [id number] DOB: May 18, 1951 Today's Date: 11/15/2018    History of Present Illness Pt is a 67 y/o female s/p elective L TKA. PMH includes CKD, major depressive disorder, prediabetes, and lumbar surgery.     PT Comments    Patient seen for mobility progression. This session focused on LE strengthening and ROM exercises. Pt is making progress with ROM and tolerated session well. Current plan remains appropriate.    Follow Up Recommendations  Follow surgeon's recommendation for DC plan and follow-up therapies;Supervision for mobility/OOB     Equipment Recommendations  3in1 (PT)    Recommendations for Other Services OT consult     Precautions / Restrictions Precautions Precautions: Knee Precaution Comments: Reviewed knee precautions/positioning with pt.  Restrictions Weight Bearing Restrictions: Yes LLE Weight Bearing: Weight bearing as tolerated    Mobility  Bed Mobility               General bed mobility comments: pt OOB in chair upon arrival  Transfers                 General transfer comment: N/A this session  Ambulation/Gait                 Stairs             Wheelchair Mobility    Modified Rankin (Stroke Patients Only)       Balance Overall balance assessment: Needs assistance Sitting-balance support: No upper extremity supported;Feet supported Sitting balance-Leahy Scale: Good                                      Cognition Arousal/Alertness: Awake/alert Behavior During Therapy: WFL for tasks assessed/performed Overall Cognitive Status: Within Functional Limits for tasks assessed                                        Exercises Total Joint Exercises Ankle Circles/Pumps: AROM;Both Quad Sets: Both;10 reps Towel Squeeze: AROM;10 reps Short Arc Quad: AROM;Left;10 reps Heel Slides: AAROM;Left;10 reps Hip  ABduction/ADduction: AROM;Left;10 reps Straight Leg Raises: AROM;Left;10 reps Long Arc Quad: AROM;Left;10 reps;Seated Knee Flexion: AROM;AAROM;10 reps;Seated;Other (comment)(10 second holds)    General Comments        Pertinent Vitals/Pain Pain Assessment: Faces Faces Pain Scale: Hurts little more(to 6 with exercises) Pain Location: L knee  Pain Descriptors / Indicators: Guarding;Sore;Grimacing Pain Intervention(s): Limited activity within patient's tolerance;Monitored during session;Premedicated before session;Repositioned    Home Living                      Prior Function            PT Goals (current goals can now be found in the care plan section) Progress towards PT goals: Progressing toward goals    Frequency    7X/week      PT Plan Current plan remains appropriate    Co-evaluation              AM-PAC PT "6 Clicks" Mobility   Outcome Measure  Help needed turning from your back to your side while in a flat bed without using bedrails?: None Help needed moving from lying on your back to sitting on the side of a flat bed without using bedrails?: None Help needed moving to  and from a bed to a chair (including a wheelchair)?: A Little Help needed standing up from a chair using your arms (e.g., wheelchair or bedside chair)?: A Little Help needed to walk in hospital room?: A Little Help needed climbing 3-5 steps with a railing? : A Little 6 Click Score: 20    End of Session Equipment Utilized During Treatment: Gait belt Activity Tolerance: Patient tolerated treatment well Patient left: in chair;with call bell/phone within reach;with nursing/sitter in room Nurse Communication: Mobility status PT Visit Diagnosis: Unsteadiness on feet (R26.81);Muscle weakness (generalized) (M62.81);Other abnormalities of gait and mobility (R26.89)     Time: 1535-1610 PT Time Calculation (min) (ACUTE ONLY): 35 min  Charges:  $Therapeutic Exercise: 23-37 mins                      Earney Navy, PTA Acute Rehabilitation Services Pager: [phone] Office: [phone]     Darliss Cheney 11/15/2018, 4:28 PM

## 2018-11-15 NOTE — Plan of Care (Signed)
  Problem: Education: Goal: Knowledge of General Education information will improve Description Including pain rating scale, medication(s)/side effects and non-pharmacologic comfort measures Outcome: Progressing   Problem: Health Behavior/Discharge Planning: Goal: Ability to manage health-related needs will improve Outcome: Progressing   Problem: Clinical Measurements: Goal: Ability to maintain clinical measurements within normal limits will improve Outcome: Progressing Goal: Diagnostic test results will improve Outcome: Not Applicable Goal: Respiratory complications will improve Outcome: Not Applicable Goal: Cardiovascular complication will be avoided Outcome: Progressing   Problem: Activity: Goal: Risk for activity intolerance will decrease Outcome: Progressing

## 2018-11-15 NOTE — Progress Notes (Signed)
PT Cancellation Note  Patient Details Name: Yvonne Long MRN: [id number] DOB: [birth year]   Cancelled Treatment:    Reason Eval/Treat Not Completed: Patient declined, no reason specified Pt declined participating in therapy at this time and requests time to rest from being up with PA this am. Pt in CPM machine upon arrival. PT will continue to follow acutely.   Salina April, PTA Acute Rehabilitation Services Pager: [phone] Office: [phone]   11/15/2018, 9:58 AM

## 2018-11-15 NOTE — Care Management Note (Signed)
Case Management Note  Patient Details  Name: Yvonne Long MRN: [id number] Date of Birth: August 26, 1951  Subjective/Objective:   67 yr old female s/p left total knee arthroplasty.                 Action/Plan: Case manager spoke with patient concerning discharge plan and DME. Patient was preoperatively setup with [facility], no changes. She will have family support at discharge.    Expected Discharge Date:    11/16/18              Expected Discharge Plan:  [place]  In-House Referral:  NA  Discharge planning Services  CM Consult  Post Acute Care Choice:  Durable Medical Equipment, Home Health Choice offered to:  Patient  DME Arranged:  3-N-1, CPM, Walker rolling DME Agency:  [place] [employer]  HH Arranged:  PT HH Agency:  [facility] (formerly [facility])  Status of Service:  Completed, signed off  If discussed at [employer], dates discussed:    Additional Comments:  Ninfa Meeker, RN 11/15/2018, 1:14 PM

## 2018-11-15 NOTE — Plan of Care (Signed)

## 2018-11-15 NOTE — Evaluation (Signed)
Occupational Therapy Evaluation Patient Details Name: Yvonne Long MRN: [id number] DOB: 1951-02-03 Today's Date: 11/15/2018    History of Present Illness Pt is a 67 y/o female s/p elective L TKA. PMH includes CKD, major depressive disorder, prediabetes, and lumbar surgery.    Clinical Impression   PTA, pt was living with her husband and was independent. Currently, pt requires Min Guard A for LB ADLs and functional mobility using RW. Provided education on LB ADLs, toilet transfer, and shower transfer with shower seat; pt demonstrated understanding. Answered all pt questions. Recommend dc home once medically stable per physician. All acute OT needs met and will sign off. Thank you.    Follow Up Recommendations  Follow surgeon's recommendation for DC plan and follow-up therapies;Supervision/Assistance - 24 hour    Equipment Recommendations  3 in 1 bedside commode    Recommendations for Other Services PT consult     Precautions / Restrictions Precautions Precautions: Knee Precaution Booklet Issued: Yes (comment) Precaution Comments: Reviewed knee precautions/positioning with pt.  Required Braces or Orthoses: Knee Immobilizer - Left Knee Immobilizer - Left: Other (comment)(not used this session) Restrictions Weight Bearing Restrictions: Yes LLE Weight Bearing: Weight bearing as tolerated      Mobility Bed Mobility               General bed mobility comments: pt OOB in chair upon arrival  Transfers Overall transfer level: Needs assistance Equipment used: Rolling walker (2 wheeled) Transfers: Sit to/from Stand Sit to Stand: Min guard         General transfer comment: Min Guard A for safety    Balance Overall balance assessment: Needs assistance Sitting-balance support: No upper extremity supported;Feet supported Sitting balance-Leahy Scale: Fair     Standing balance support: Bilateral upper extremity supported;During functional activity Standing  balance-Leahy Scale: Fair                             ADL either performed or assessed with clinical judgement   ADL Overall ADL's : Needs assistance/impaired Eating/Feeding: Independent;Sitting   Grooming: Set up;Standing;Supervision/safety   Upper Body Bathing: Set up;Sitting   Lower Body Bathing: Min guard;Sit to/from stand   Upper Body Dressing : Set up;Sitting   Lower Body Dressing: Min guard;Sit to/from stand   Toilet Transfer: Min guard;Ambulation;RW;BSC           Functional mobility during ADLs: Min guard;Rolling walker General ADL Comments: Providing education on LB ADLs, toileting, and shower transfer. Pt demonstrting understanding.      Vision         Perception     Praxis      Pertinent Vitals/Pain Pain Assessment: Faces Faces Pain Scale: Hurts little more Pain Location: L knee  Pain Descriptors / Indicators: Guarding;Sore Pain Intervention(s): Monitored during session;Limited activity within patient's tolerance;Repositioned     Hand Dominance     Extremity/Trunk Assessment Upper Extremity Assessment Upper Extremity Assessment: Overall WFL for tasks assessed   Lower Extremity Assessment Lower Extremity Assessment: Defer to PT evaluation LLE Deficits / Details: Deficits consistent with post op pain and weakness. Reports increased numbness in LLE.    Cervical / Trunk Assessment Cervical / Trunk Assessment: Normal   Communication Communication Communication: No difficulties   Cognition Arousal/Alertness: Awake/alert Behavior During Therapy: WFL for tasks assessed/performed Overall Cognitive Status: Within Functional Limits for tasks assessed  General Comments  Husband present throughout    Exercises     Shoulder Instructions      Home Living Family/patient expects to be discharged to:: Private residence Living Arrangements: Spouse/significant other Available Help at  Discharge: Family;Available 24 hours/day Type of Home: House Home Access: Stairs to enter [employer] of Steps: 3-4 Entrance Stairs-Rails: None Home Layout: Two level;Able to live on main level with bedroom/bathroom     Bathroom Shower/Tub: Occupational psychologist: Standard     Home Equipment: Environmental consultant - 2 wheels;Cane - single point          Prior Functioning/Environment Level of Independence: Independent                 OT Problem List: Decreased strength;Decreased range of motion;Decreased activity tolerance;Impaired balance (sitting and/or standing);Decreased knowledge of use of DME or AE;Decreased knowledge of precautions      OT Treatment/Interventions:      OT Goals(Current goals can be found in the care plan section) Acute Rehab OT Goals Patient Stated Goal: "to get back on my feet"  OT Goal Formulation: All assessment and education complete, DC therapy  OT Frequency:     Barriers to D/C:            Co-evaluation              AM-PAC OT "6 Clicks" Daily Activity     Outcome Measure Help from another person eating meals?: None Help from another person taking care of personal grooming?: None Help from another person toileting, which includes using toliet, bedpan, or urinal?: None Help from another person bathing (including washing, rinsing, drying)?: A Little Help from another person to put on and taking off regular upper body clothing?: None Help from another person to put on and taking off regular lower body clothing?: A Little 6 Click Score: 22   End of Session CPM Left Knee Additional Comments: foot roll  Activity Tolerance:   Patient left:    OT Visit Diagnosis: Unsteadiness on feet (R26.81);Other abnormalities of gait and mobility (R26.89);Muscle weakness (generalized) (M62.81);Pain Pain - Right/Left: Left Pain - part of body: Knee                Time: 1140-1154 OT Time Calculation (min): 14 min Charges:  OT General  Charges $OT Visit: 1 Visit OT Evaluation $OT Eval Low Complexity: 1 Low  Jull Harral MSOT, OTR/L Acute Rehab Pager: [phone] Office: [place] 11/15/2018, 12:32 PM

## 2018-11-16 LAB — HEMOGLOBIN A1C
Hgb A1c MFr Bld: 6.3 % — ABNORMAL HIGH (ref 4.8–5.6)
Mean Plasma Glucose: 134.11 mg/dL

## 2018-11-16 LAB — BASIC METABOLIC PANEL
Anion gap: 6 (ref 5–15)
BUN: 14 mg/dL (ref 8–23)
CO2: 25 mmol/L (ref 22–32)
Calcium: 8.5 mg/dL — ABNORMAL LOW (ref 8.9–10.3)
Chloride: 108 mmol/L (ref 98–111)
Creatinine, Ser: 0.85 mg/dL (ref 0.44–1.00)
GFR calc Af Amer: >60 mL/min (ref >60)
GFR calc non Af Amer: >60 mL/min (ref >60)
Glucose, Bld: 177 mg/dL — ABNORMAL HIGH (ref 70–99)
Potassium: 4 mmol/L (ref 3.5–5.1)
Sodium: 139 mmol/L (ref 135–145)

## 2018-11-16 LAB — CBC
HCT: 37.2 % (ref 36.0–46.0)
Hemoglobin: 11.6 g/dL — ABNORMAL LOW (ref 12.0–15.0)
MCH: 28 pg (ref 26.0–34.0)
MCHC: 31.2 g/dL (ref 30.0–36.0)
MCV: 89.9 fL (ref 80.0–100.0)
Platelets: 235 10*3/uL (ref 150–400)
RBC: 4.14 MIL/uL (ref 3.87–5.11)
RDW: 13.7 % (ref 11.5–15.5)
WBC: 12.3 10*3/uL — ABNORMAL HIGH (ref 4.0–10.5)
nRBC: 0 % (ref 0.0–0.2)

## 2018-11-16 MED ORDER — POLYETHYLENE GLYCOL 3350 17 G PO PACK: PACK | ORAL | 0 refills | Status: DC

## 2018-11-16 MED ORDER — ASPIRIN 325 MG PO TBEC: DELAYED_RELEASE_TABLET | ORAL | 0 refills | Status: DC

## 2018-11-16 MED ORDER — GABAPENTIN 300 MG PO CAPS: 300.0000 mg | ORAL_CAPSULE | Freq: Every day | ORAL | 0 refills | Status: DC

## 2018-11-16 MED ORDER — OXYCODONE HCL 5 MG PO TABS: ORAL_TABLET | ORAL | 0 refills | Status: DC

## 2018-11-16 MED ORDER — DOCUSATE SODIUM 100 MG PO CAPS: ORAL_CAPSULE | ORAL | 0 refills | Status: DC

## 2018-11-16 NOTE — Plan of Care (Signed)

## 2018-11-16 NOTE — Discharge Summary (Signed)
Patient ID: Yvonne Long MRN: [id number] DOB/AGE: 66-Aug-1952 66 y.o.  Admit date: 11/14/2018 Discharge date: 11/16/2018  Admission Diagnoses:  Principal Problem:   Primary localized osteoarthritis of left knee Active Problems:   Hyperlipidemia   Major depressive disorder, recurrent episode (HCC)   Chronic kidney disease (CKD) stage G3b/A1, moderately decreased glomerular filtration rate (GFR) between 30-44 mL/min/1.73 square meter and albuminuria creatinine ratio less than 30 mg/g [facility])   Discharge Diagnoses:  Same  Past Medical History:  Diagnosis Date  . Depression   . Diverticulum   . Primary localized osteoarthritis of left knee 11/02/2018    Surgeries: Procedure(s): left TOTAL KNEE ARTHROPLASTY on 11/14/2018   Consultants:   Discharged Condition: Improved  Hospital Course: Yvonne Long is an 67 y.o. female who was admitted 11/14/2018 for operative treatment ofPrimary localized osteoarthritis of left knee. Patient has severe unremitting pain that affects sleep, daily activities, and work/hobbies. After pre-op clearance the patient was taken to the operating room on 11/14/2018 and underwent  Procedure(s): left TOTAL KNEE ARTHROPLASTY.    Patient was given perioperative antibiotics:  Anti-infectives (From admission, onward)   Start     Dose/Rate Route Frequency Ordered Stop   11/14/18 1500  ceFAZolin (ANCEF) IVPB 2g/100 mL premix     2 g 200 mL/hr over 30 Minutes Intravenous Every 6 hours 11/14/18 1256 11/14/18 2152   11/14/18 0950  cefUROXime (ZINACEF) injection  Status:  Discontinued       As needed 11/14/18 0951 11/14/18 1116   11/14/18 0715  ceFAZolin (ANCEF) IVPB 2g/100 mL premix     2 g 200 mL/hr over 30 Minutes Intravenous On call to O.R. 11/14/18 0701 11/14/18 0915       Patient was given sequential compression devices, early ambulation, and chemoprophylaxis to prevent DVT.  Patient benefited maximally from hospital stay and there were  no complications.    Recent vital signs:  Patient Vitals for the past 24 hrs:  BP Temp Temp src Pulse Resp SpO2  11/16/18 0921 (!) 143/66 (!) 96.8 F (36 C) Rectal 65 17 94 %  11/16/18 0633 (!) 147/74 98.2 F (36.8 C) Oral 64 18 97 %  11/15/18 2050 135/78 98.3 F (36.8 C) Oral 72 20 97 %  11/15/18 1258 136/73 97.6 F (36.4 C) Oral 68 20 97 %     Recent laboratory studies:  Recent Labs    11/15/18 0355 11/16/18 0214  WBC 13.6* 12.3*  HGB 12.0 11.6*  HCT 38.6 37.2  PLT 247 235  NA 138 139  K 4.1 4.0  CL 109 108  CO2 21* 25  BUN 13 14  CREATININE 0.97 0.85  GLUCOSE 179* 177*  CALCIUM 8.5* 8.5*     Discharge Medications:   Allergies as of 11/16/2018      Reactions   Codeine Nausea Only, Other (See Comments)   On an empty stomach only      Medication List    STOP taking these medications   aspirin 81 MG tablet Replaced by:  aspirin 325 MG EC tablet   ELDERBERRY PO   GLUCOSAMINE-CHONDROITIN-MSM PO   Vitamin B-12 5000 MCG Tbdp     TAKE these medications   acitretin 25 MG capsule Commonly known as:  SORIATANE Take 25 mg by mouth every other day.   aspirin 325 MG EC tablet 1 tab a day for the next 30 days to prevent blood clots Replaces:  aspirin 81 MG tablet   atorvastatin 20 MG tablet Commonly known as:  LIPITOR  Take 1 tablet (20 mg total) by mouth at bedtime.   Biotin 10 MG Tabs Take 10 mg by mouth daily.   docusate sodium 100 MG capsule Commonly known as:  COLACE 1 tab 2 times a day while on narcotics.  STOOL SOFTENER   EQL VITAMIN D3 25 MCG (1000 UT) capsule Generic drug:  Cholecalciferol Take 1,000 Units by mouth daily.   gabapentin 300 MG capsule Commonly known as:  NEURONTIN Take 1 capsule (300 mg total) by mouth at bedtime.   oxyCODONE 5 MG immediate release tablet Commonly known as:  Oxy IR/ROXICODONE 1 po q 4 hrs prn pain   PARoxetine 30 MG tablet Commonly known as:  PAXIL Take 1 tablet (30 mg total) by mouth daily.    polyethylene glycol packet Commonly known as:  MIRALAX / GLYCOLAX 17grams in 6 oz of something to drink twice a day until bowel movement.  LAXITIVE.  Restart if two days since last bowel movement            Durable Medical Equipment  (From admission, onward)         Start     Ordered   11/15/18 1215  For home use only DME 3 n 1  Once     11/15/18 1214           Discharge Care Instructions  (From admission, onward)         Start     Ordered   11/16/18 0000  Change dressing    Comments:  DO NOT REMOVE BANDAGE OVER SURGICAL INCISION.  [place] WHOLE LEG INCLUDING OVER THE WATERPROOF BANDAGE WITH SOAP AND WATER EVERY DAY.   11/16/18 1223          Diagnostic Studies: No results found.  Disposition: Discharge disposition: 01-Home or Self Care       Discharge Instructions    CPM   Complete by:  As directed    Continuous passive motion machine (CPM):      Use the CPM from 0 to 90 for 6 hours per day.       You may break it up into 2 or 3 sessions per day.      Use CPM for 2 weeks or until you are told to stop.   Call MD / Call 911   Complete by:  As directed    If you experience chest pain or shortness of breath, CALL 911 and be transported to the hospital emergency room.  If you develope a fever above 101 F, pus (white drainage) or increased drainage or redness at the wound, or calf pain, call your surgeon's office.   Change dressing   Complete by:  As directed    DO NOT REMOVE BANDAGE OVER SURGICAL INCISION.  [place] WHOLE LEG INCLUDING OVER THE WATERPROOF BANDAGE WITH SOAP AND WATER EVERY DAY.   Constipation Prevention   Complete by:  As directed    Drink plenty of fluids.  Prune juice may be helpful.  You may use a stool softener, such as Colace (over the counter) 100 mg twice a day.  Use MiraLax (over the counter) for constipation as needed.   Diet - low sodium heart healthy   Complete by:  As directed    Discharge instructions   Complete by:  As directed     INSTRUCTIONS AFTER JOINT REPLACEMENT   Remove items at home which could result in a fall. This includes throw rugs or furniture in walking pathways ICE to the affected  joint every three hours while awake for 30 minutes at a time, for at least the first 3-5 days, and then as needed for pain and swelling.  Continue to use ice for pain and swelling. You may notice swelling that will progress down to the foot and ankle.  This is normal after surgery.  Elevate your leg when you are not up walking on it.   Continue to use the breathing machine you got in the hospital (incentive spirometer) which will help keep your temperature down.  It is common for your temperature to cycle up and down following surgery, especially at night when you are not up moving around and exerting yourself.  The breathing machine keeps your lungs expanded and your temperature down.   DIET:  As you were doing prior to hospitalization, we recommend a well-balanced diet.  DRESSING / WOUND CARE / SHOWERING  Keep the surgical dressing until follow up.  The dressing is water proof, so you can shower without any extra covering.  IF THE DRESSING FALLS OFF or the wound gets wet inside, change the dressing with sterile gauze.  Please use good hand washing techniques before changing the dressing.  Do not use any lotions or creams on the incision until instructed by your surgeon.    ACTIVITY  Increase activity slowly as tolerated, but follow the weight bearing instructions below.   No driving for 6 weeks or until further direction given by your physician.  You cannot drive while taking narcotics.  No lifting or carrying greater than 10 lbs. until further directed by your surgeon. Avoid periods of inactivity such as sitting longer than an hour when not asleep. This helps prevent blood clots.  You may return to work once you are authorized by your doctor.     WEIGHT BEARING   Weight bearing as tolerated with assist device (walker, cane,  etc) as directed, use it as long as suggested by your surgeon or therapist, typically at least 2-3 weeks.   EXERCISES  Results after joint replacement surgery are often greatly improved when you follow the exercise, range of motion and muscle strengthening exercises prescribed by your doctor. Safety measures are also important to protect the joint from further injury. Any time any of these exercises cause you to have increased pain or swelling, decrease what you are doing until you are comfortable again and then slowly increase them. If you have problems or questions, call your caregiver or physical therapist for advice.   Rehabilitation is important following a joint replacement. After just a few days of immobilization, the muscles of the leg can become weakened and shrink (atrophy).  These exercises are designed to build up the tone and strength of the thigh and leg muscles and to improve motion. Often times heat used for twenty to thirty minutes before working out will loosen up your tissues and help with improving the range of motion but do not use heat for the first two weeks following surgery (sometimes heat can increase post-operative swelling).   These exercises can be done on a training (exercise) mat, on the floor, on a table or on a bed. Use whatever works the best and is most comfortable for you.    Use music or television while you are exercising so that the exercises are a pleasant break in your day. This will make your life better with the exercises acting as a break in your routine that you can look forward to.   Perform all exercises about  fifteen times, three times per day or as directed.  You should exercise both the operative leg and the other leg as well.   Exercises include:  Quad Sets - Tighten up the muscle on the front of the thigh (Quad) and hold for 5-10 seconds.   Straight Leg Raises - With your knee straight (if you were given a brace, keep it on), lift the leg to 60  degrees, hold for 3 seconds, and slowly lower the leg.  Perform this exercise against resistance later as your leg gets stronger.  Leg Slides: Lying on your back, slowly slide your foot toward your buttocks, bending your knee up off the floor (only go as far as is comfortable). Then slowly slide your foot back down until your leg is flat on the floor again.  Angel Wings: Lying on your back spread your legs to the side as far apart as you can without causing discomfort.  Hamstring Strength:  Lying on your back, push your heel against the floor with your leg straight by tightening up the muscles of your buttocks.  Repeat, but this time bend your knee to a comfortable angle, and push your heel against the floor.  You may put a pillow under the heel to make it more comfortable if necessary.   A rehabilitation program following joint replacement surgery can speed recovery and prevent re-injury in the future due to weakened muscles. Contact your doctor or a physical therapist for more information on knee rehabilitation.    CONSTIPATION  Constipation is defined medically as fewer than three stools per week and severe constipation as less than one stool per week.  Even if you have a regular bowel pattern at home, your normal regimen is likely to be disrupted due to multiple reasons following surgery.  Combination of anesthesia, postoperative narcotics, change in appetite and fluid intake all can affect your bowels.   YOU MUST use at least one of the following options; they are listed in order of increasing strength to get the job done.  They are all available over the counter, and you may need to use some, POSSIBLY even all of these options:    Drink plenty of fluids (prune juice may be helpful) and high fiber foods Colace 100 mg by mouth twice a day  Senokot for constipation as directed and as needed Dulcolax (bisacodyl), take with full glass of water  Miralax (polyethylene glycol) once or twice a day as  needed.  If you have tried all these things and are unable to have a bowel movement in the first 3-4 days after surgery call either your surgeon or your primary doctor.    If you experience loose stools or diarrhea, hold the medications until you stool forms back up.  If your symptoms do not get better within 1 week or if they get worse, check with your doctor.  If you experience "the worst abdominal pain ever" or develop nausea or vomiting, please contact the office immediately for further recommendations for treatment.   ITCHING:  If you experience itching with your medications, try taking only a single pain pill, or even half a pain pill at a time.  You can also use Benadryl over the counter for itching or also to help with sleep.   TED HOSE STOCKINGS:  Use stockings on both legs until for at least 2 weeks or as directed by physician office. They may be removed at night for sleeping.  MEDICATIONS:  See your medication summary on  the "After Visit Summary" that nursing will review with you.  You may have some home medications which will be placed on hold until you complete the course of blood thinner medication.  It is important for you to complete the blood thinner medication as prescribed.  PRECAUTIONS:  If you experience chest pain or shortness of breath - call 911 immediately for transfer to the hospital emergency department.   If you develop a fever greater that 101 F, purulent drainage from wound, increased redness or drainage from wound, foul odor from the wound/dressing, or calf pain - CONTACT YOUR SURGEON.                                                   FOLLOW-UP APPOINTMENTS:  If you do not already have a post-op appointment, please call the office for an appointment to be seen by your surgeon.  Guidelines for how soon to be seen are listed in your "After Visit Summary", but are typically between 1-4 weeks after surgery.  OTHER INSTRUCTIONS:   Knee Replacement:  Do not place pillow  under knee, focus on keeping the knee straight while resting. CPM instructions: 0-90 degrees, 2 hours in the morning, 2 hours in the afternoon, and 2 hours in the evening. Place foam block, curve side up under heel at all times except when in CPM or when walking.  DO NOT modify, tear, cut, or change the foam block in any way.  MAKE SURE YOU:  Understand these instructions.  Get help right away if you are not doing well or get worse.    Thank you for letting us be a part of your medical care team.  It is a privilege we respect greatly.  We hope these instructions will help you stay on track for a fast and full recovery!   Do not put a pillow under the knee. Place it under the heel.   Complete by:  As directed    Place gray foam block, curve side up under heel at all times except when in CPM or when walking.  DO NOT modify, tear, cut, or change in any way the gray foam block.   Increase activity slowly as tolerated   Complete by:  As directed    Patient may shower   Complete by:  As directed    Aquacel dressing is water proof    Wash over it and the whole leg with soap and water at the end of your shower   TED hose   Complete by:  As directed    Use stockings (TED hose) for 2 weeks on both leg(s).  You may remove them at night for sleeping.      Follow-up Information    [facility] Follow up.   Specialty:  [place] Why:  A representative from [facility] will contact you to arrange start date and time for your therapy. Contact information: [address] [place] [address] [phone]        Elsie Saas, MD Follow up on 11/28/2018.   Specialty:  Orthopedic Surgery Why:  appointment at 3:30 pm Contact information: [place] [address] [phone]        [facility] Follow up on 11/28/2018.   Why:  arrive at 10:30 for  an 11 am Physical therapy appointment Contact information: [address] [place], [place] [address] [phone]           Signed: Linda Hedges 11/16/2018, 12:35 PM

## 2018-11-16 NOTE — Progress Notes (Signed)
Provided discharge education/instructions, all questions and concerns addressed, Pt not in distress, discharged home with belongings accompanied by husband. 

## 2018-11-16 NOTE — Progress Notes (Signed)
Physical Therapy Treatment Patient Details Name: Yvonne Long MRN: [id number] DOB: 04-29-1951 Today's Date: 11/16/2018    History of Present Illness Pt is a 67 y/o female s/p elective L TKA. PMH includes CKD, major depressive disorder, prediabetes, and lumbar surgery.     PT Comments    Patient is making good progress with PT.  From a mobility standpoint anticipate patient will be ready for DC home when medically ready.    Follow Up Recommendations  Follow surgeon's recommendation for DC plan and follow-up therapies;Supervision for mobility/OOB     Equipment Recommendations  3in1 (PT)    Recommendations for Other Services OT consult     Precautions / Restrictions Precautions Precautions: Knee Precaution Comments: Reviewed knee precautions/positioning with pt.  Restrictions Weight Bearing Restrictions: Yes LLE Weight Bearing: Weight bearing as tolerated    Mobility  Bed Mobility               General bed mobility comments: pt OOB in chair upon arrival  Transfers Overall transfer level: Needs assistance Equipment used: Rolling walker (2 wheeled) Transfers: Sit to/from Stand Sit to Stand: Supervision         General transfer comment: supervision for safety; cues for safety   Ambulation/Gait Ambulation/Gait assistance: Supervision Gait Distance (Feet): 300 Feet Assistive device: Rolling walker (2 wheeled) Gait Pattern/deviations: Step-through pattern;Decreased stride length Gait velocity: decreased   General Gait Details: still midly antalgic gait but improving; cues for posture and increased stride length; improved weight bearing and less reliance on bilat UE    Stairs Stairs: Yes Stairs assistance: Min assist Stair Management: No rails;Step to pattern;Backwards;With walker Number of Stairs: (2 steps X 2 trials) General stair comments: husband present; cues for sequencing and technique; assist to stabilize RW    Wheelchair Mobility     Modified Rankin (Stroke Patients Only)       Balance Overall balance assessment: Needs assistance Sitting-balance support: No upper extremity supported;Feet supported Sitting balance-Leahy Scale: Good     Standing balance support: Bilateral upper extremity supported;During functional activity Standing balance-Leahy Scale: Poor                              Cognition Arousal/Alertness: Awake/alert Behavior During Therapy: WFL for tasks assessed/performed Overall Cognitive Status: Within Functional Limits for tasks assessed                                        Exercises Total Joint Exercises Straight Leg Raises: AROM;Left;10 reps Long Arc Quad: AROM;Left;10 reps;Seated Knee Flexion: AROM;AAROM;10 reps;Seated;Other (comment)(10 second holds)    General Comments        Pertinent Vitals/Pain Pain Assessment: Faces Faces Pain Scale: Hurts little more Pain Location: L knee  Pain Descriptors / Indicators: Sore Pain Intervention(s): Limited activity within patient's tolerance;Monitored during session;Premedicated before session;Repositioned;Ice applied    Home Living                      Prior Function            PT Goals (current goals can now be found in the care plan section) Progress towards PT goals: Progressing toward goals    Frequency    7X/week      PT Plan Current plan remains appropriate    Co-evaluation  AM-PAC PT "6 Clicks" Mobility   Outcome Measure  Help needed turning from your back to your side while in a flat bed without using bedrails?: None Help needed moving from lying on your back to sitting on the side of a flat bed without using bedrails?: None Help needed moving to and from a bed to a chair (including a wheelchair)?: A Little Help needed standing up from a chair using your arms (e.g., wheelchair or bedside chair)?: A Little Help needed to walk in hospital room?: A Little Help  needed climbing 3-5 steps with a railing? : A Little 6 Click Score: 20    End of Session Equipment Utilized During Treatment: Gait belt Activity Tolerance: Patient tolerated treatment well Patient left: in chair;with call bell/phone within reach;with nursing/sitter in room Nurse Communication: Mobility status PT Visit Diagnosis: Unsteadiness on feet (R26.81);Muscle weakness (generalized) (M62.81);Other abnormalities of gait and mobility (R26.89)     Time: 7412-8786 PT Time Calculation (min) (ACUTE ONLY): 18 min  Charges:  $Gait Training: 8-22 mins                     Earney Navy, PTA Acute Rehabilitation Services Pager: [phone] Office: [phone]     Darliss Cheney 11/16/2018, 11:18 AM

## 2019-01-04 ENCOUNTER — Other Ambulatory Visit: Payer: Self-pay | Admitting: Family Medicine

## 2019-01-04 DIAGNOSIS — J3081 Allergic rhinitis due to animal (cat) (dog) hair and dander: Secondary | ICD-10-CM

## 2019-01-18 ENCOUNTER — Other Ambulatory Visit: Payer: Self-pay | Admitting: Family Medicine

## 2019-01-26 ENCOUNTER — Ambulatory Visit: Payer: Commercial Managed Care - PPO | Admitting: Family Medicine

## 2019-01-26 ENCOUNTER — Encounter: Payer: Self-pay | Admitting: Family Medicine

## 2019-01-26 VITALS — BP 134/60 | HR 70 | Ht 66.0 in | Wt 211.0 lb

## 2019-01-26 DIAGNOSIS — G4733 Obstructive sleep apnea (adult) (pediatric): Secondary | ICD-10-CM | POA: Diagnosis not present

## 2019-01-26 DIAGNOSIS — Z96652 Presence of left artificial knee joint: Secondary | ICD-10-CM

## 2019-01-26 DIAGNOSIS — Z23 Encounter for immunization: Secondary | ICD-10-CM

## 2019-01-26 MED ORDER — AMBULATORY NON FORMULARY MEDICATION: 0 refills | Status: DC

## 2019-01-26 NOTE — Progress Notes (Signed)
Subjective:    CC: Here to go over sleep study results.  HPI:  68 year old female is here today to go over her recent sleep study results that were performed in November.  She did have her left knee replacement November 25 and is here to go over her results.  She has noticed that at night sometimes she will wake up feeling like she cannot breathe especially if she has any extra nasal drainage or congestion in her chest.  Is actually lost 16 pounds since she was last here.  She says she did not feel like eating very much previously.  She has done really well with her knee replacement.  She did not receive a Tdap before replacement.  Past medical history, Surgical history, Family history not pertinant except as noted below, Social history, Allergies, and medications have been entered into the medical record, reviewed, and corrections made.   Review of Systems: No fevers, chills, night sweats, weight loss, chest pain, or shortness of breath.   Objective:    General: Well Developed, well nourished, and in no acute distress.  Neuro: Alert and oriented x3, extra-ocular muscles intact, sensation grossly intact.  HEENT: Normocephalic, atraumatic  Skin: Warm and dry, no rashes. Cardiac: Regular rate and rhythm, no murmurs rubs or gallops, no lower extremity edema.  Respiratory: Clear to auscultation bilaterally. Not using accessory muscles, speaking in full sentences.   Impression and Recommendations:   Obstructive sleep apnea-AHI of 14 which is indicative of mild apnea.  No significant central apneas noted.  Oxygen desaturation was noted down to 85% during the study and patient did snore.  I did review the results with her and discussed potential treatment options including CPAP versus oral appliance.  Recommendation was for CPAP refitted oral appliance and avoiding alcohol and sedatives before bedtime in addition to working on sleep hygiene and weight management.  We will schedule CPAP through 1 of  the local home health agencies and have her set for AutoPap for the next 10 days with a download.  Status post left knee replacement-she is actually doing really well.  She says she still has some occasional pain but is recovering nicely.  Congratulated her on her recent weight loss she is doing fantastic.   Tdap given today.

## 2019-02-07 ENCOUNTER — Encounter: Payer: Self-pay | Admitting: Family Medicine

## 2019-02-19 ENCOUNTER — Other Ambulatory Visit: Payer: Self-pay | Admitting: Family Medicine

## 2019-02-19 DIAGNOSIS — F330 Major depressive disorder, recurrent, mild: Secondary | ICD-10-CM

## 2019-03-02 ENCOUNTER — Ambulatory Visit: Payer: Commercial Managed Care - PPO | Admitting: Family Medicine

## 2019-05-16 ENCOUNTER — Other Ambulatory Visit: Payer: Self-pay | Admitting: Family Medicine

## 2019-05-16 DIAGNOSIS — F330 Major depressive disorder, recurrent, mild: Secondary | ICD-10-CM

## 2019-05-18 ENCOUNTER — Telehealth: Payer: Self-pay | Admitting: Family Medicine

## 2019-05-18 DIAGNOSIS — F330 Major depressive disorder, recurrent, mild: Secondary | ICD-10-CM

## 2019-05-18 DIAGNOSIS — Z1211 Encounter for screening for malignant neoplasm of colon: Secondary | ICD-10-CM

## 2019-05-18 DIAGNOSIS — Z1231 Encounter for screening mammogram for malignant neoplasm of breast: Secondary | ICD-10-CM

## 2019-05-18 MED ORDER — PAROXETINE HCL 30 MG PO TABS: 30.0000 mg | ORAL_TABLET | Freq: Every day | ORAL | 1 refills | Status: DC

## 2019-05-18 NOTE — Telephone Encounter (Signed)
Called pt and advised that mammogram and cologuard will be ordered.Maryruth Eve, [place] Crocker, CMA

## 2019-05-18 NOTE — Telephone Encounter (Signed)
Dr. Madilyn Fireman Tonya-CMA Pt sent a My Chart request wanting to know if her mammogram can be set up and also would like to have a At Home Fecal test Kit. Please advise pt.

## 2019-06-12 ENCOUNTER — Encounter: Payer: Self-pay | Admitting: Family Medicine

## 2019-06-28 ENCOUNTER — Encounter: Payer: Self-pay | Admitting: Family Medicine

## 2019-06-29 ENCOUNTER — Encounter: Payer: Self-pay | Admitting: Family Medicine

## 2019-06-29 ENCOUNTER — Ambulatory Visit (INDEPENDENT_AMBULATORY_CARE_PROVIDER_SITE_OTHER): Payer: Commercial Managed Care - PPO

## 2019-06-29 DIAGNOSIS — Z1231 Encounter for screening mammogram for malignant neoplasm of breast: Secondary | ICD-10-CM | POA: Diagnosis not present

## 2019-06-29 NOTE — Telephone Encounter (Signed)
Please call to schedule

## 2019-06-29 NOTE — Telephone Encounter (Signed)
I called the number in this phone note and left a message for them to call us at [phone]- Option 2 for scheduling

## 2019-06-30 LAB — COLOGUARD: Cologuard: NEGATIVE

## 2019-07-03 ENCOUNTER — Telehealth: Payer: Self-pay | Admitting: Family Medicine

## 2019-07-03 NOTE — Telephone Encounter (Signed)
Please call patient and let her know that her Cologuard is negative.

## 2019-07-04 NOTE — Telephone Encounter (Signed)
Pt advised.

## 2019-07-17 ENCOUNTER — Encounter: Payer: Self-pay | Admitting: Family Medicine

## 2019-07-17 ENCOUNTER — Telehealth: Payer: Self-pay | Admitting: *Deleted

## 2019-07-17 ENCOUNTER — Other Ambulatory Visit: Payer: Self-pay

## 2019-07-17 ENCOUNTER — Ambulatory Visit (INDEPENDENT_AMBULATORY_CARE_PROVIDER_SITE_OTHER): Payer: Commercial Managed Care - PPO | Admitting: Family Medicine

## 2019-07-17 VITALS — BP 138/54 | HR 73 | Ht 66.0 in | Wt 212.0 lb

## 2019-07-17 DIAGNOSIS — E785 Hyperlipidemia, unspecified: Secondary | ICD-10-CM

## 2019-07-17 DIAGNOSIS — B009 Herpesviral infection, unspecified: Secondary | ICD-10-CM

## 2019-07-17 DIAGNOSIS — G4733 Obstructive sleep apnea (adult) (pediatric): Secondary | ICD-10-CM | POA: Diagnosis not present

## 2019-07-17 DIAGNOSIS — N183 Chronic kidney disease, stage 3 (moderate): Secondary | ICD-10-CM

## 2019-07-17 DIAGNOSIS — N1832 Chronic kidney disease, stage 3b: Secondary | ICD-10-CM

## 2019-07-17 HISTORY — DX: Herpesviral infection, unspecified: B00.9

## 2019-07-17 MED ORDER — VALACYCLOVIR HCL 1 G PO TABS: 1000.0000 mg | ORAL_TABLET | Freq: Every day | ORAL | 11 refills | Status: DC

## 2019-07-17 MED ORDER — AMBULATORY NON FORMULARY MEDICATION: 0 refills | Status: DC

## 2019-07-17 NOTE — Assessment & Plan Note (Signed)
We discussed options of as needed valacyclovir versus daily suppressive therapy.  For right now she just wants to be able to take it as needed.  New prescription sent to the pharmacy.  She says in fact she had used it years ago.  She does not member any negative side effects.

## 2019-07-17 NOTE — Assessment & Plan Note (Signed)
Due to recheck lipid panel.  Currently on Lipitor.

## 2019-07-17 NOTE — Assessment & Plan Note (Signed)
Due for 60-month check for kidney function.

## 2019-07-17 NOTE — Telephone Encounter (Signed)
Order for cpap faxed and confirmation received.Elouise Munroe, [place]

## 2019-07-17 NOTE — Progress Notes (Signed)
V   Established Patient Office Visit  Subjective:  Patient ID: Yvonne Long, female    DOB: [birth year]  Age: 68 y.o. MRN: [id number]  CC:  Chief Complaint  Patient presents with  . Follow-up    HPI Yvonne Long presents    F/U OSA -she says she never actually got the CPAP supplies.  She says they did call her but then right around that time she could not call them back and never heard back from them.  Thus she has not been using the machine.  She also wanted to let me know that she has had genital herpes for a most 35 years.  She says even her husband does not know and she is never even shared that with her OB/GYN.  She has never been on any specific treatment.  She also has a history of oral cold sores.  She says that because of stress levels recently she is been breaking out more frequently and would like to discuss treatment options.  Currently on Lipitor and tolerating well.  Due for recheck on lipids.  Past Medical History:  Diagnosis Date  . Depression   . Diverticulum   . Primary localized osteoarthritis of left knee 11/02/2018    Past Surgical History:  Procedure Laterality Date  . ABDOMINAL HYSTERECTOMY  2005   Dr. Toy Cookey  . BILATERAL SALPINGOOPHORECTOMY  2005   Dr. Noralee Stain  . CHOLECYSTECTOMY  2013  . CYSTOCELE REPAIR  2005   with bladder tack  . SPINE SURGERY  2/11   Lumbar Laminectomy-Dr. Owens Shark  . TOTAL KNEE ARTHROPLASTY Left 11/14/2018  . TOTAL KNEE ARTHROPLASTY Left 11/14/2018   Procedure: left TOTAL KNEE ARTHROPLASTY;  Surgeon: Elsie Saas, MD;  Location: [facility];  Service: Orthopedics;  Laterality: Left;    Family History  Problem Relation Age of Onset  . Alcohol abuse Father   . Depression Father   . Peripheral vascular disease Father   . Thrombosis Father        deceased from thrombosis of leg.  Marland Kitchen Heart attack Other   . Hypertension Mother   . Cancer Other        Brain cancer  . Stroke Other   . Depression Son      Social History   Socioeconomic History  . Marital status: Married    Spouse name: Darnell Level  . Number of children: 2  . Years of education: Not on file  . Highest education level: Not on file  Occupational History  . Occupation: Web designer: [employer]  . Occupation: Freight forwarder  Social Needs  . Financial resource strain: Not on file  . Food insecurity    Worry: Not on file    Inability: Not on file  . Transportation needs    Medical: Not on file    Non-medical: Not on file  Tobacco Use  . Smoking status: Former Smoker    Quit date: 12/21/1994    Years since quitting: 24.5  . Smokeless tobacco: Never Used  Substance and Sexual Activity  . Alcohol use: Yes    Alcohol/week: 1.0 standard drinks    Types: 1 Glasses of wine per week    Comment: 1 alcoholic drink per day  . Drug use: No  . Sexual activity: Not on file  Lifestyle  . Physical activity    Days per week: Not on file    Minutes per session: Not on file  . Stress: Not on file  Relationships  .  Social Herbalist on phone: Not on file    Gets together: Not on file    Attends religious service: Not on file    Active member of club or organization: Not on file    Attends meetings of clubs or organizations: Not on file    Relationship status: Not on file  . Intimate partner violence    Fear of current or ex partner: Not on file    Emotionally abused: Not on file    Physically abused: Not on file    Forced sexual activity: Not on file  Other Topics Concern  . Not on file  Social History Narrative   1 caffeinated drink/day.  Enjoys yard work and yoga    Outpatient Medications Prior to Visit  Medication Sig Dispense Refill  . acitretin (SORIATANE) 25 MG capsule Take 25 mg by mouth every other day.   6  . aspirin EC 325 MG EC tablet 1 tab a day for the next 30 days to prevent blood clots 30 tablet 0  . atorvastatin (LIPITOR) 20 MG tablet TAKE 1 TABLET BY MOUTH EVERYDAY AT  BEDTIME 90 tablet 3  . Biotin 10 MG TABS Take 10 mg by mouth daily.     . Cholecalciferol (EQL VITAMIN D3) 25 MCG (1000 UT) capsule Take 1,000 Units by mouth daily.     Marland Kitchen docusate sodium (COLACE) 100 MG capsule 1 tab 2 times a day while on narcotics.  STOOL SOFTENER 60 capsule 0  . fluticasone (FLONASE) 50 MCG/ACT nasal spray SPRAY 2 SPRAYS INTO EACH NOSTRIL EVERY DAY 48 g 2  . gabapentin (NEURONTIN) 300 MG capsule Take 1 capsule (300 mg total) by mouth at bedtime. 30 capsule 0  . PARoxetine (PAXIL) 30 MG tablet Take 1 tablet (30 mg total) by mouth daily. 90 tablet 1  . AMBULATORY NON FORMULARY MEDICATION Medication Name: CPAP with nasal pillows and humidifier.  Set to AutoPap 4-20 cm watere pressure setting for 10 days and then please fax his download so that we can set her pressure.  New diagnosis of obstructive sleep apnea with AHI of 14.2.  Was faxed to [facility] (Patient not taking: Reported on 07/17/2019) 1 vial 0  . polyethylene glycol (MIRALAX / GLYCOLAX) packet 17grams in 6 oz of something to drink twice a day until bowel movement.  LAXITIVE.  Restart if two days since last bowel movement 14 each 0   No facility-administered medications prior to visit.     Allergies  Allergen Reactions  . Codeine Nausea Only and Other (See Comments)    On an empty stomach only    ROS Review of Systems    Objective:    Physical Exam  BP (!) 138/54   Pulse 73   Ht 5\' 6"  (1.676 m)   Wt 212 lb (96.2 kg)   SpO2 96%   BMI 34.22 kg/m  Wt Readings from Last 3 Encounters:  07/17/19 212 lb (96.2 kg)  01/26/19 211 lb (95.7 kg)  11/14/18 227 lb 1.2 oz (103 kg)     Health Maintenance Due  Topic Date Due  . DEXA SCAN  09/29/2017  . Fecal DNA (Cologuard)  04/15/2019    There are no preventive care reminders to display for this patient.  Lab Results  Component Value Date   TSH 1.431 07/17/2015   Lab Results  Component Value Date   WBC 12.3 (H) 11/16/2018   HGB 11.6 (L) 11/16/2018    HCT 37.2 11/16/2018  MCV 89.9 11/16/2018   PLT 235 11/16/2018   Lab Results  Component Value Date   NA 139 11/16/2018   K 4.0 11/16/2018   CO2 25 11/16/2018   GLUCOSE 177 (H) 11/16/2018   BUN 14 11/16/2018   CREATININE 0.85 11/16/2018   BILITOT 0.4 11/04/2018   ALKPHOS 54 11/04/2018   AST 26 11/04/2018   ALT 31 11/04/2018   PROT 6.7 11/04/2018   ALBUMIN 3.6 11/04/2018   CALCIUM 8.5 (L) 11/16/2018   ANIONGAP 6 11/16/2018   Lab Results  Component Value Date   CHOL 157 06/10/2018   Lab Results  Component Value Date   HDL 53 06/10/2018   Lab Results  Component Value Date   LDLCALC 76 06/10/2018   Lab Results  Component Value Date   TRIG 187 (H) 06/10/2018   Lab Results  Component Value Date   CHOLHDL 3.0 06/10/2018   Lab Results  Component Value Date   HGBA1C 6.3 (H) 11/16/2018      Assessment & Plan:   Problem List Items Addressed This Visit      Respiratory   OSA (obstructive sleep apnea) - Primary    Patient never received supplies.  We will try to call the DME supplier and get them reconnected and get them delivered.  I would really encourage her to get her sleep apnea treated I think it would make a big difference in how she feels.      Relevant Medications   AMBULATORY NON FORMULARY MEDICATION     Genitourinary   Chronic kidney disease (CKD) stage G3b/A1, moderately decreased glomerular filtration rate (GFR) between 30-44 mL/min/1.73 square meter and albuminuria creatinine ratio less than 30 mg/g (HCC)    Due for 77-month check for kidney function.      Relevant Orders   Lipid panel   COMPLETE METABOLIC PANEL WITH GFR     Other   Hyperlipidemia    Due to recheck lipid panel.  Currently on Lipitor.      Relevant Orders   Lipid panel   COMPLETE METABOLIC PANEL WITH GFR   Herpes simplex infection    We discussed options of as needed valacyclovir versus daily suppressive therapy.  For right now she just wants to be able to take it as  needed.  New prescription sent to the pharmacy.  She says in fact she had used it years ago.  She does not member any negative side effects.      Relevant Medications   valACYclovir (VALTREX) 1000 MG tablet      Meds ordered this encounter  Medications  . valACYclovir (VALTREX) 1000 MG tablet    Sig: Take 1 tablet (1,000 mg total) by mouth daily. X 5 days PRN outbreak    Dispense:  30 tablet    Refill:  11  . AMBULATORY NON FORMULARY MEDICATION    Sig: Medication Name: CPAP with nasal pillows and humidifier.  Set to AutoPap 4-20 cm watere pressure setting for 10 days and then please fax his download so that we can set her pressure.  New diagnosis of obstructive sleep apnea with AHI of 14.2.  Was faxed to [facility]    Dispense:  1 vial    Refill:  0    Follow-up: Return if symptoms worsen or fail to improve.    Beatrice Lecher, MD

## 2019-07-17 NOTE — Assessment & Plan Note (Signed)
Patient never received supplies.  We will try to call the DME supplier and get them reconnected and get them delivered.  I would really encourage her to get her sleep apnea treated I think it would make a big difference in how she feels.

## 2019-07-19 LAB — LIPID PANEL
Cholesterol: 155 mg/dL (ref <200)
HDL: 63 mg/dL (ref >=50)
LDL Cholesterol (Calc): 71 mg/dL (calc)
Non-HDL Cholesterol (Calc): 92 mg/dL (calc) (ref <130)
Total CHOL/HDL Ratio: 2.5 (calc) (ref <5.0)
Triglycerides: 124 mg/dL (ref <150)

## 2019-07-19 LAB — COMPLETE METABOLIC PANEL WITH GFR
AG Ratio: 1.7 (calc) (ref 1.0–2.5)
ALT: 25 U/L (ref 6–29)
AST: 18 U/L (ref 10–35)
Albumin: 4 g/dL (ref 3.6–5.1)
Alkaline phosphatase (APISO): 57 U/L (ref 37–153)
BUN: 17 mg/dL (ref 7–25)
CO2: 25 mmol/L (ref 20–32)
Calcium: 9 mg/dL (ref 8.6–10.4)
Chloride: 106 mmol/L (ref 98–110)
Creat: 0.96 mg/dL (ref 0.50–0.99)
GFR, Est African American: 71 mL/min/{1.73_m2} (ref >=60)
GFR, Est Non African American: 62 mL/min/{1.73_m2} (ref >=60)
Globulin: 2.4 g/dL (calc) (ref 1.9–3.7)
Glucose, Bld: 125 mg/dL — ABNORMAL HIGH (ref 65–99)
Potassium: 4.3 mmol/L (ref 3.5–5.3)
Sodium: 141 mmol/L (ref 135–146)
Total Bilirubin: 0.5 mg/dL (ref 0.2–1.2)
Total Protein: 6.4 g/dL (ref 6.1–8.1)

## 2019-07-21 ENCOUNTER — Encounter: Payer: Self-pay | Admitting: Family Medicine

## 2019-08-07 DIAGNOSIS — G4733 Obstructive sleep apnea (adult) (pediatric): Secondary | ICD-10-CM | POA: Diagnosis not present

## 2019-08-15 ENCOUNTER — Encounter: Payer: Self-pay | Admitting: Family Medicine

## 2019-09-07 DIAGNOSIS — G4733 Obstructive sleep apnea (adult) (pediatric): Secondary | ICD-10-CM | POA: Diagnosis not present

## 2019-09-25 ENCOUNTER — Ambulatory Visit: Payer: Commercial Managed Care - PPO

## 2019-09-29 ENCOUNTER — Ambulatory Visit: Payer: Medicare Other

## 2019-10-03 ENCOUNTER — Ambulatory Visit (INDEPENDENT_AMBULATORY_CARE_PROVIDER_SITE_OTHER): Payer: Medicare Other | Admitting: Family Medicine

## 2019-10-03 ENCOUNTER — Other Ambulatory Visit: Payer: Self-pay

## 2019-10-03 DIAGNOSIS — Z23 Encounter for immunization: Secondary | ICD-10-CM | POA: Diagnosis not present

## 2019-10-06 ENCOUNTER — Other Ambulatory Visit: Payer: Self-pay

## 2019-10-06 ENCOUNTER — Encounter: Payer: Self-pay | Admitting: Family Medicine

## 2019-10-06 ENCOUNTER — Ambulatory Visit: Payer: Medicare Other | Admitting: Physician Assistant

## 2019-10-06 NOTE — Telephone Encounter (Signed)
Spoke with Pt. Scheduled for virtual visit today.

## 2019-10-07 DIAGNOSIS — G4733 Obstructive sleep apnea (adult) (pediatric): Secondary | ICD-10-CM | POA: Diagnosis not present

## 2019-10-11 ENCOUNTER — Ambulatory Visit (INDEPENDENT_AMBULATORY_CARE_PROVIDER_SITE_OTHER): Payer: Medicare Other | Admitting: Family Medicine

## 2019-10-11 ENCOUNTER — Encounter: Payer: Self-pay | Admitting: Family Medicine

## 2019-10-11 VITALS — Temp 99.1°F

## 2019-10-11 DIAGNOSIS — R509 Fever, unspecified: Secondary | ICD-10-CM

## 2019-10-11 DIAGNOSIS — R059 Cough, unspecified: Secondary | ICD-10-CM

## 2019-10-11 DIAGNOSIS — Z20822 Contact with and (suspected) exposure to covid-19: Secondary | ICD-10-CM

## 2019-10-11 DIAGNOSIS — Z20828 Contact with and (suspected) exposure to other viral communicable diseases: Secondary | ICD-10-CM

## 2019-10-11 DIAGNOSIS — R05 Cough: Secondary | ICD-10-CM

## 2019-10-11 NOTE — Progress Notes (Signed)
Pt reports that she has had a fever x 4 days. It goes from 99.1-100.8.   She reports being achy,weak,having a cough,sneezing,and bad headaches. She denies any n/v/d, loss of taste/smell(she stated that she has a H/O not being able to smell).   She has been alternating between tylenol and IBU. She has been using saline spray.  I asked if she has been exposed to anyone. She stated that her son came by over the weekend and he had a cold but was better by the next day.  Maryruth Eve, [place]ahoma Crocker, CMA

## 2019-10-11 NOTE — Progress Notes (Signed)
Virtual Visit via Video Note  I connected with Yvonne Long on 10/11/19 at  8:10 AM EDT by a video enabled telemedicine application and verified that I am speaking with the correct person using two identifiers.   I discussed the limitations of evaluation and management by telemedicine and the availability of in person appointments. The patient expressed understanding and agreed to proceed.    Acute Office Visit  Subjective:    Patient ID: Yvonne Long, female    DOB: 1951-10-11, 68 y.o.   MRN: [id number]  Chief Complaint  Patient presents with  . Fever    HPI Patient is in today for fever. Pt reports that she has had a fever x 4 days. It goes from 99.1-100.8.   She reports being achy,weak,having a cough,sneezing,and bad headaches. She denies any n/v/d, loss of taste/smell(she stated that she has a H/O not being able to smell). Cough is mostly wet. Pain under her ears and feels a little swollen . No GI sxs.   She has been alternating between tylenol and IBU. She has been using saline spray.  I asked if she has been exposed to anyone. She stated that her son came by over the weekend and he had a cold but was better by the next day.  Past Medical History:  Diagnosis Date  . Depression   . Diverticulum   . Primary localized osteoarthritis of left knee 11/02/2018    Past Surgical History:  Procedure Laterality Date  . ABDOMINAL HYSTERECTOMY  2005   Dr. Toy Cookey  . BILATERAL SALPINGOOPHORECTOMY  2005   Dr. Noralee Stain  . CHOLECYSTECTOMY  2013  . CYSTOCELE REPAIR  2005   with bladder tack  . SPINE SURGERY  2/11   Lumbar Laminectomy-Dr. Owens Shark  . TOTAL KNEE ARTHROPLASTY Left 11/14/2018  . TOTAL KNEE ARTHROPLASTY Left 11/14/2018   Procedure: left TOTAL KNEE ARTHROPLASTY;  Surgeon: Elsie Saas, MD;  Location: [facility];  Service: Orthopedics;  Laterality: Left;    Family History  Problem Relation Age of Onset  . Alcohol abuse Father   . Depression Father    . Peripheral vascular disease Father   . Thrombosis Father        deceased from thrombosis of leg.  Marland Kitchen Heart attack Other   . Hypertension Mother   . Cancer Other        Brain cancer  . Stroke Other   . Depression Son     Social History   Socioeconomic History  . Marital status: Married    Spouse name: Darnell Level  . Number of children: 2  . Years of education: Not on file  . Highest education level: Not on file  Occupational History  . Occupation: Web designer: [employer]  . Occupation: Freight forwarder  Social Needs  . Financial resource strain: Not on file  . Food insecurity    Worry: Not on file    Inability: Not on file  . Transportation needs    Medical: Not on file    Non-medical: Not on file  Tobacco Use  . Smoking status: Former Smoker    Quit date: 12/21/1994    Years since quitting: 24.8  . Smokeless tobacco: Never Used  Substance and Sexual Activity  . Alcohol use: Yes    Alcohol/week: 1.0 standard drinks    Types: 1 Glasses of wine per week    Comment: 1 alcoholic drink per day  . Drug use: No  . Sexual activity: Not on  file  Lifestyle  . Physical activity    Days per week: Not on file    Minutes per session: Not on file  . Stress: Not on file  Relationships  . Social Herbalist on phone: Not on file    Gets together: Not on file    Attends religious service: Not on file    Active member of club or organization: Not on file    Attends meetings of clubs or organizations: Not on file    Relationship status: Not on file  . Intimate partner violence    Fear of current or ex partner: Not on file    Emotionally abused: Not on file    Physically abused: Not on file    Forced sexual activity: Not on file  Other Topics Concern  . Not on file  Social History Narrative   1 caffeinated drink/day.  Enjoys yard work and yoga    Outpatient Medications Prior to Visit  Medication Sig Dispense Refill  . AMBULATORY NON FORMULARY  MEDICATION Medication Name: CPAP with nasal pillows and humidifier.  Set to AutoPap 4-20 cm watere pressure setting for 10 days and then please fax his download so that we can set her pressure.  New diagnosis of obstructive sleep apnea with AHI of 14.2.  Was faxed to [facility] 1 vial 0  . aspirin EC 325 MG EC tablet 1 tab a day for the next 30 days to prevent blood clots 30 tablet 0  . atorvastatin (LIPITOR) 20 MG tablet TAKE 1 TABLET BY MOUTH EVERYDAY AT BEDTIME 90 tablet 3  . Biotin 10 MG TABS Take 10 mg by mouth daily.     . Cholecalciferol (EQL VITAMIN D3) 25 MCG (1000 UT) capsule Take 1,000 Units by mouth daily.     . fluticasone (FLONASE) 50 MCG/ACT nasal spray SPRAY 2 SPRAYS INTO EACH NOSTRIL EVERY DAY 48 g 2  . PARoxetine (PAXIL) 30 MG tablet Take 1 tablet (30 mg total) by mouth daily. 90 tablet 1  . valACYclovir (VALTREX) 1000 MG tablet Take 1 tablet (1,000 mg total) by mouth daily. X 5 days PRN outbreak 30 tablet 11  . acitretin (SORIATANE) 25 MG capsule Take 25 mg by mouth every other day.   6  . docusate sodium (COLACE) 100 MG capsule 1 tab 2 times a day while on narcotics.  STOOL SOFTENER 60 capsule 0  . gabapentin (NEURONTIN) 300 MG capsule Take 1 capsule (300 mg total) by mouth at bedtime. 30 capsule 0   No facility-administered medications prior to visit.     Allergies  Allergen Reactions  . Codeine Nausea Only and Other (See Comments)    On an empty stomach only    ROS     Objective:    Physical Exam  Constitutional: She is oriented to person, place, and time. She appears well-developed and well-nourished.  Patient sitting in bed.  Appears very pale and fatigued.  She does not appear to be short of breath  HENT:  Head: Normocephalic and atraumatic.  Eyes: Conjunctivae and EOM are normal.  Cardiovascular: Normal rate.  Pulmonary/Chest: Effort normal.  Neurological: She is alert and oriented to person, place, and time.  Skin: Skin is dry. No pallor.   Psychiatric: She has a normal mood and affect. Her behavior is normal.  Vitals reviewed.   Temp 99.1 F (37.3 C)  Wt Readings from Last 3 Encounters:  07/17/19 212 lb (96.2 kg)  01/26/19 211 lb (95.7  kg)  11/14/18 227 lb 1.2 oz (103 kg)    Health Maintenance Due  Topic Date Due  . DEXA SCAN  09/29/2017    There are no preventive care reminders to display for this patient.   Lab Results  Component Value Date   TSH 1.431 07/17/2015   Lab Results  Component Value Date   WBC 12.3 (H) 11/16/2018   HGB 11.6 (L) 11/16/2018   HCT 37.2 11/16/2018   MCV 89.9 11/16/2018   PLT 235 11/16/2018   Lab Results  Component Value Date   NA 141 07/18/2019   K 4.3 07/18/2019   CO2 25 07/18/2019   GLUCOSE 125 (H) 07/18/2019   BUN 17 07/18/2019   CREATININE 0.96 07/18/2019   BILITOT 0.5 07/18/2019   ALKPHOS 54 11/04/2018   AST 18 07/18/2019   ALT 25 07/18/2019   PROT 6.4 07/18/2019   ALBUMIN 3.6 11/04/2018   CALCIUM 9.0 07/18/2019   ANIONGAP 6 11/16/2018   Lab Results  Component Value Date   CHOL 155 07/18/2019   Lab Results  Component Value Date   HDL 63 07/18/2019   Lab Results  Component Value Date   LDLCALC 71 07/18/2019   Lab Results  Component Value Date   TRIG 124 07/18/2019   Lab Results  Component Value Date   CHOLHDL 2.5 07/18/2019   Lab Results  Component Value Date   HGBA1C 6.3 (H) 11/16/2018       Assessment & Plan:   Problem List Items Addressed This Visit    None    Visit Diagnoses    Fever, unspecified fever cause    -  Primary   Cough       Suspected COVID-19 virus infection         Fever and cough-unclear etiology suspect some type of viral illness possibly Covid.  Do recommend that she get tested.  It certainly possible that her Hunt son had it that he had a much milder version though he did run a low-grade temperature.  Discussed with her options for testing including [facility] and possibly [facility]  if she is not improving or feels like she is getting worse over the next couple days please give [place] a call back.  It is reassuring that she is not feeling short of breath.  Encourage hydration.  Okay to continue alternating Tylenol and ibuprofen for fever and pain relief.  No orders of the defined types were placed in this encounter.   I discussed the assessment and treatment plan with the patient. The patient was provided an opportunity to ask questions and all were answered. The patient agreed with the plan and demonstrated an understanding of the instructions.   The patient was advised to call back or seek an in-person evaluation if the symptoms worsen or if the condition fails to improve as anticipated.    Beatrice Lecher, MD

## 2019-10-15 ENCOUNTER — Encounter (HOSPITAL_COMMUNITY): Payer: Self-pay

## 2019-10-15 ENCOUNTER — Emergency Department (HOSPITAL_COMMUNITY): Payer: Medicare Other

## 2019-10-15 ENCOUNTER — Encounter: Payer: Self-pay | Admitting: Family Medicine

## 2019-10-15 ENCOUNTER — Other Ambulatory Visit: Payer: Self-pay

## 2019-10-15 ENCOUNTER — Emergency Department (HOSPITAL_COMMUNITY)
Admission: EM | Admit: 2019-10-15 | Discharge: 2019-10-15 | Disposition: A | Discharge status: Home / Self Care | Payer: Medicare Other | Attending: Emergency Medicine | Admitting: Emergency Medicine

## 2019-10-15 DIAGNOSIS — R0602 Shortness of breath: Secondary | ICD-10-CM | POA: Diagnosis not present

## 2019-10-15 DIAGNOSIS — N1832 Chronic kidney disease, stage 3b: Secondary | ICD-10-CM | POA: Insufficient documentation

## 2019-10-15 DIAGNOSIS — Z87891 Personal history of nicotine dependence: Secondary | ICD-10-CM | POA: Insufficient documentation

## 2019-10-15 DIAGNOSIS — Z79899 Other long term (current) drug therapy: Secondary | ICD-10-CM | POA: Diagnosis not present

## 2019-10-15 DIAGNOSIS — U071 COVID-19: Secondary | ICD-10-CM | POA: Diagnosis not present

## 2019-10-15 DIAGNOSIS — R079 Chest pain, unspecified: Secondary | ICD-10-CM | POA: Diagnosis not present

## 2019-10-15 LAB — COMPREHENSIVE METABOLIC PANEL
ALT: 45 U/L — ABNORMAL HIGH (ref 0–44)
AST: 35 U/L (ref 15–41)
Albumin: 3.7 g/dL (ref 3.5–5.0)
Alkaline Phosphatase: 68 U/L (ref 38–126)
Anion gap: 10 (ref 5–15)
BUN: 12 mg/dL (ref 8–23)
CO2: 26 mmol/L (ref 22–32)
Calcium: 8.6 mg/dL — ABNORMAL LOW (ref 8.9–10.3)
Chloride: 100 mmol/L (ref 98–111)
Creatinine, Ser: 1.01 mg/dL — ABNORMAL HIGH (ref 0.44–1.00)
GFR calc Af Amer: >60 mL/min (ref >60)
GFR calc non Af Amer: 58 mL/min — ABNORMAL LOW (ref >60)
Glucose, Bld: 127 mg/dL — ABNORMAL HIGH (ref 70–99)
Potassium: 4.4 mmol/L (ref 3.5–5.1)
Sodium: 136 mmol/L (ref 135–145)
Total Bilirubin: 0.4 mg/dL (ref 0.3–1.2)
Total Protein: 7.1 g/dL (ref 6.5–8.1)

## 2019-10-15 LAB — CBC WITH DIFFERENTIAL/PLATELET
Abs Immature Granulocytes: 0.01 10*3/uL (ref 0.00–0.07)
Basophils Absolute: 0 10*3/uL (ref 0.0–0.1)
Basophils Relative: 0 %
Eosinophils Absolute: 0 10*3/uL (ref 0.0–0.5)
Eosinophils Relative: 1 %
HCT: 44.6 % (ref 36.0–46.0)
Hemoglobin: 14.2 g/dL (ref 12.0–15.0)
Immature Granulocytes: 0 %
Lymphocytes Relative: 23 %
Lymphs Abs: 1.5 10*3/uL (ref 0.7–4.0)
MCH: 29.5 pg (ref 26.0–34.0)
MCHC: 31.8 g/dL (ref 30.0–36.0)
MCV: 92.5 fL (ref 80.0–100.0)
Monocytes Absolute: 0.5 10*3/uL (ref 0.1–1.0)
Monocytes Relative: 8 %
Neutro Abs: 4.4 10*3/uL (ref 1.7–7.7)
Neutrophils Relative %: 68 %
Platelets: 210 10*3/uL (ref 150–400)
RBC: 4.82 MIL/uL (ref 3.87–5.11)
RDW: 13.5 % (ref 11.5–15.5)
WBC: 6.5 10*3/uL (ref 4.0–10.5)
nRBC: 0 % (ref 0.0–0.2)

## 2019-10-15 LAB — BRAIN NATRIURETIC PEPTIDE: B Natriuretic Peptide: 12.5 pg/mL (ref 0.0–100.0)

## 2019-10-15 LAB — CBG MONITORING, ED: Glucose-Capillary: 122 mg/dL — ABNORMAL HIGH (ref 70–99)

## 2019-10-15 LAB — PROTIME-INR
INR: 1 (ref 0.8–1.2)
Prothrombin Time: 13.2 seconds (ref 11.4–15.2)

## 2019-10-15 LAB — TROPONIN I (HIGH SENSITIVITY): Troponin I (High Sensitivity): 4 ng/L (ref <18)

## 2019-10-15 LAB — MAGNESIUM: Magnesium: 2.1 mg/dL (ref 1.7–2.4)

## 2019-10-15 LAB — C-REACTIVE PROTEIN: CRP: 6.2 mg/dL — ABNORMAL HIGH (ref <1.0)

## 2019-10-15 MED ORDER — SODIUM CHLORIDE 0.9 % IV BOLUS
500.0000 mL | Freq: Once | INTRAVENOUS | Status: AC
  Administered 2019-10-15: 500 mL via INTRAVENOUS

## 2019-10-15 MED ORDER — ASPIRIN 81 MG PO CHEW
324.0000 mg | CHEWABLE_TABLET | Freq: Once | ORAL | Status: AC
  Administered 2019-10-15: 324 mg via ORAL
  Filled 2019-10-15: qty 4

## 2019-10-15 MED ORDER — ALBUTEROL SULFATE HFA 108 (90 BASE) MCG/ACT IN AERS
2.0000 | INHALATION_SPRAY | Freq: Four times a day (QID) | RESPIRATORY_TRACT | Status: DC
  Administered 2019-10-15: 2 via RESPIRATORY_TRACT
  Filled 2019-10-15: qty 6.7

## 2019-10-15 MED ORDER — DEXAMETHASONE SODIUM PHOSPHATE 10 MG/ML IJ SOLN
10.0000 mg | Freq: Once | INTRAMUSCULAR | Status: AC
  Administered 2019-10-15: 10 mg via INTRAVENOUS
  Filled 2019-10-15: qty 1

## 2019-10-15 MED ORDER — KETOROLAC TROMETHAMINE 15 MG/ML IJ SOLN
15.0000 mg | Freq: Once | INTRAMUSCULAR | Status: AC
  Administered 2019-10-15: 15 mg via INTRAVENOUS
  Filled 2019-10-15: qty 1

## 2019-10-15 NOTE — ED Provider Notes (Signed)
[facility] DEPT Provider Note   CSN: [id number] Arrival date & time: 10/15/19  1130     History   Chief Complaint Chief Complaint  Patient presents with  . COVID +  . Shortness of Breath    HPI Yvonne Long is a 68 y.o. female.     HPI Patient presents 8 days after her illness began with concern for chest pain, dyspnea, fatigue. Prior to the onset of illness patient was well. Initially she had typical URI-like illness, but with persistence of these she was tested for coronavirus. Patient was tested positive for days ago. She notes that in spite of rest, self-monitoring, she has become worse rather than better over the interval 4 days. No fever, no vomiting, no diarrhea. Chest pain is persistent, worse with activity, inspiration, sore, tight. Cough is persistent, dyspnea is persistent. Past Medical History:  Diagnosis Date  . Depression   . Diverticulum   . Primary localized osteoarthritis of left knee 11/02/2018    Patient Active Problem List   Diagnosis Date Noted  . Herpes simplex infection 07/17/2019  . OSA (obstructive sleep apnea) 11/11/2018  . Primary localized osteoarthritis of left knee 11/02/2018  . Chronic kidney disease (CKD) stage G3b/A1, moderately decreased glomerular filtration rate (GFR) between 30-44 mL/min/1.73 square meter and albuminuria creatinine ratio less than 30 mg/g (HCC) 03/19/2016  . Thoracic back pain 02/12/2016  . Left knee pain 11/20/2015  . Prediabetes 04/16/2015  . Hyperlipidemia 07/24/2008  . PALPITATIONS 07/05/2007  . Major depressive disorder, recurrent episode ([place]) 09/28/2006    Past Surgical History:  Procedure Laterality Date  . ABDOMINAL HYSTERECTOMY  2005   Dr. Toy Cookey  . BILATERAL SALPINGOOPHORECTOMY  2005   Dr. Noralee Stain  . CHOLECYSTECTOMY  2013  . CYSTOCELE REPAIR  2005   with bladder tack  . SPINE SURGERY  2/11   Lumbar Laminectomy-Dr. Owens Shark  . TOTAL KNEE ARTHROPLASTY  Left 11/14/2018  . TOTAL KNEE ARTHROPLASTY Left 11/14/2018   Procedure: left TOTAL KNEE ARTHROPLASTY;  Surgeon: Elsie Saas, MD;  Location: [facility];  Service: Orthopedics;  Laterality: Left;     OB History   No obstetric history on file.      Home Medications    Prior to Admission medications   Medication Sig Start Date End Date Taking? Authorizing Provider  atorvastatin (LIPITOR) 20 MG tablet TAKE 1 TABLET BY MOUTH EVERYDAY AT BEDTIME Patient taking differently: Take 20 mg by mouth daily at 6 PM.  01/18/19  Yes Metheney, Rene Kocher, MD  Cholecalciferol (EQL VITAMIN D3) 25 MCG (1000 UT) capsule Take 1,000 Units by mouth daily.    Yes [provider]  PARoxetine (PAXIL) 30 MG tablet Take 1 tablet (30 mg total) by mouth daily. 05/18/19  Yes Hali Marry, MD  AMBULATORY NON FORMULARY MEDICATION Medication Name: CPAP with nasal pillows and humidifier.  Set to AutoPap 4-20 cm watere pressure setting for 10 days and then please fax his download so that we can set her pressure.  New diagnosis of obstructive sleep apnea with AHI of 14.2.  Was faxed to [facility] 07/17/19   Hali Marry, MD  aspirin EC 325 MG EC tablet 1 tab a day for the next 30 days to prevent blood clots Patient not taking: Reported on 10/15/2019 11/16/18   Shepperson, Kirstin, PA-C  fluticasone (FLONASE) 50 MCG/ACT nasal spray SPRAY 2 SPRAYS INTO EACH NOSTRIL EVERY DAY Patient not taking: Reported on 10/15/2019 01/04/19   Hali Marry, MD  valACYclovir (VALTREX) 1000 MG tablet Take 1 tablet (1,000 mg total) by mouth daily. X 5 days PRN outbreak Patient not taking: Reported on 10/15/2019 07/17/19   Hali Marry, MD    Family History Family History  Problem Relation Age of Onset  . Alcohol abuse Father   . Depression Father   . Peripheral vascular disease Father   . Thrombosis Father        deceased from thrombosis of leg.  Marland Kitchen Heart attack Other   . Hypertension Mother   .  Cancer Other        Brain cancer  . Stroke Other   . Depression Son     Social History Social History   Tobacco Use  . Smoking status: Former Smoker    Quit date: 12/21/1994    Years since quitting: 24.8  . Smokeless tobacco: Never Used  Substance Use Topics  . Alcohol use: Yes    Alcohol/week: 1.0 standard drinks    Types: 1 Glasses of wine per week    Comment: 1 alcoholic drink per day  . Drug use: No     Allergies   Codeine   Review of Systems Review of Systems  Constitutional:       Per HPI, otherwise negative  HENT:       Per HPI, otherwise negative  Respiratory:       Per HPI, otherwise negative  Cardiovascular:       Per HPI, otherwise negative  Gastrointestinal: Negative for vomiting.  Endocrine:       Negative aside from HPI  Genitourinary:       Neg aside from HPI   Musculoskeletal:       Per HPI, otherwise negative  Skin: Negative.   Allergic/Immunologic: Negative for immunocompromised state.  Neurological: Positive for weakness. Negative for syncope.     Physical Exam Updated Vital Signs BP 115/60   Pulse 66   Temp 99.4 F (37.4 C) (Oral)   Resp (!) 22   SpO2 95%   Physical Exam Vitals signs and nursing note reviewed.  Constitutional:      General: She is not in acute distress.    Appearance: She is well-developed. She is obese.  HENT:     Head: Normocephalic and atraumatic.  Eyes:     Conjunctiva/sclera: Conjunctivae normal.  Cardiovascular:     Rate and Rhythm: Normal rate and regular rhythm.  Pulmonary:     Effort: Pulmonary effort is normal. No respiratory distress.     Breath sounds: No stridor. Examination of the right-lower field reveals rhonchi. Examination of the left-lower field reveals rhonchi. Rhonchi present. No decreased breath sounds.  Abdominal:     General: There is no distension.  Skin:    General: Skin is warm and dry.  Neurological:     Mental Status: She is alert and oriented to person, place, and time.      Cranial Nerves: No cranial nerve deficit.      ED Treatments / Results  Labs (all labs ordered are listed, but only abnormal results are displayed) Labs Reviewed  COMPREHENSIVE METABOLIC PANEL - Abnormal; Notable for the following components:      Result Value   Glucose, Bld 127 (*)    Creatinine, Ser 1.01 (*)    Calcium 8.6 (*)    ALT 45 (*)    GFR calc non Af Amer 58 (*)    All other components within normal limits  C-REACTIVE PROTEIN - Abnormal; Notable for the  following components:   CRP 6.2 (*)    All other components within normal limits  CBG MONITORING, ED - Abnormal; Notable for the following components:   Glucose-Capillary 122 (*)    All other components within normal limits  MAGNESIUM  BRAIN NATRIURETIC PEPTIDE  CBC WITH DIFFERENTIAL/PLATELET  PROTIME-INR  TROPONIN I (HIGH SENSITIVITY)    Radiology Dg Chest Port 1 View  Result Date: 10/15/2019 CLINICAL DATA:  Positive COVID-19 test, shortness of breath EXAM: PORTABLE CHEST 1 VIEW COMPARISON:  02/12/2016 FINDINGS: The heart size and mediastinal contours are within normal limits. Both lungs are clear. The visualized skeletal structures are unremarkable. IMPRESSION: No active disease. Electronically Signed   By: Kathreen Devoid   On: 10/15/2019 13:17    Procedures Procedures (including critical care time)  Medications Ordered in ED Medications  albuterol (VENTOLIN HFA) 108 (90 Base) MCG/ACT inhaler 2 puff (2 puffs Inhalation Given 10/15/19 1301)  aspirin chewable tablet 324 mg (324 mg Oral Given 10/15/19 1300)  sodium chloride 0.9 % bolus 500 mL (0 mLs Intravenous Stopped 10/15/19 1441)  ketorolac (TORADOL) 15 MG/ML injection 15 mg (15 mg Intravenous Given 10/15/19 1314)  dexamethasone (DECADRON) injection 10 mg (10 mg Intravenous Given 10/15/19 1315)     Initial Impression / Assessment and Plan / ED Course  I have reviewed the triage vital signs and the nursing notes.  Pertinent labs & imaging results that  were available during my care of the patient were reviewed by me and considered in my medical decision making (see chart for details).    Patient improved substantially with fluids, Toradol, albuterol, Decadron.  This obese elderly female presents with worsening condition with known Covid positive status. Here she is awake, alert, no hypoxia, no substantial lab abnormalities, no substantial abnormality on x-ray.  Patient improved here with symptomatic therapy, was discharged in stable condition with close outpatient follow-up.  Alisande Treece was evaluated in Emergency Department on 10/15/2019 for the symptoms described in the history of present illness. She was evaluated in the context of the global COVID-19 pandemic, which necessitated consideration that the patient might be at risk for infection with the SARS-CoV-2 virus that causes COVID-19. Institutional protocols and algorithms that pertain to the evaluation of patients at risk for COVID-19 are in a state of rapid change based on information released by regulatory bodies including the CDC and federal and state organizations. These policies and algorithms were followed during the patient's care in the ED.    Final Clinical Impressions(s) / ED Diagnoses   Final diagnoses:  COVID-19 virus infection    ED Discharge Orders    None       Carmin Muskrat, MD 10/15/19 1712

## 2019-10-15 NOTE — ED Notes (Signed)
X-ray at bedside

## 2019-10-15 NOTE — ED Triage Notes (Signed)
Pt reports she tested positive on Tuesday for COVID at a clinic. Pt reports worsening SHOB since then. Pt denies any other worsening symptoms.

## 2019-10-15 NOTE — ED Notes (Signed)
ED Provider at bedside. 

## 2019-10-15 NOTE — ED Notes (Signed)
Pt called husband to come pick her up

## 2019-10-26 ENCOUNTER — Encounter: Payer: Self-pay | Admitting: Family Medicine

## 2019-11-07 DIAGNOSIS — G4733 Obstructive sleep apnea (adult) (pediatric): Secondary | ICD-10-CM | POA: Diagnosis not present

## 2019-11-13 ENCOUNTER — Other Ambulatory Visit: Payer: Self-pay | Admitting: *Deleted

## 2019-11-13 DIAGNOSIS — F330 Major depressive disorder, recurrent, mild: Secondary | ICD-10-CM

## 2019-11-13 MED ORDER — PAROXETINE HCL 30 MG PO TABS: 30.0000 mg | ORAL_TABLET | Freq: Every day | ORAL | 1 refills | Status: DC

## 2019-11-29 DIAGNOSIS — H2513 Age-related nuclear cataract, bilateral: Secondary | ICD-10-CM | POA: Diagnosis not present

## 2019-12-04 ENCOUNTER — Encounter: Payer: Self-pay | Admitting: Family Medicine

## 2019-12-07 DIAGNOSIS — G4733 Obstructive sleep apnea (adult) (pediatric): Secondary | ICD-10-CM | POA: Diagnosis not present

## 2019-12-12 ENCOUNTER — Other Ambulatory Visit: Payer: Self-pay | Admitting: Family Medicine

## 2019-12-12 DIAGNOSIS — F330 Major depressive disorder, recurrent, mild: Secondary | ICD-10-CM

## 2020-01-03 DIAGNOSIS — L820 Inflamed seborrheic keratosis: Secondary | ICD-10-CM | POA: Diagnosis not present

## 2020-01-03 DIAGNOSIS — L821 Other seborrheic keratosis: Secondary | ICD-10-CM | POA: Diagnosis not present

## 2020-01-03 DIAGNOSIS — L403 Pustulosis palmaris et plantaris: Secondary | ICD-10-CM | POA: Diagnosis not present

## 2020-01-03 DIAGNOSIS — D225 Melanocytic nevi of trunk: Secondary | ICD-10-CM | POA: Diagnosis not present

## 2020-01-03 DIAGNOSIS — Z85828 Personal history of other malignant neoplasm of skin: Secondary | ICD-10-CM | POA: Diagnosis not present

## 2020-01-03 DIAGNOSIS — L570 Actinic keratosis: Secondary | ICD-10-CM | POA: Diagnosis not present

## 2020-01-07 DIAGNOSIS — G4733 Obstructive sleep apnea (adult) (pediatric): Secondary | ICD-10-CM | POA: Diagnosis not present

## 2020-01-25 ENCOUNTER — Encounter: Payer: Self-pay | Admitting: Family Medicine

## 2020-02-07 DIAGNOSIS — G4733 Obstructive sleep apnea (adult) (pediatric): Secondary | ICD-10-CM | POA: Diagnosis not present

## 2020-02-29 ENCOUNTER — Other Ambulatory Visit: Payer: Self-pay | Admitting: Family Medicine

## 2020-03-02 ENCOUNTER — Ambulatory Visit: Payer: Medicare Other | Attending: Internal Medicine

## 2020-03-02 DIAGNOSIS — Z23 Encounter for immunization: Secondary | ICD-10-CM

## 2020-03-02 NOTE — Progress Notes (Signed)
   Covid-19 Vaccination Clinic  Name:  Yvonne Long    MRN: [id number] DOB: 15-Nov-1951  03/02/2020  [name] was observed post Covid-19 immunization for 15 minutes without incident. She was provided with Vaccine Information Sheet and instruction to access the V-Safe system.   [name] was instructed to call 911 with any severe reactions post vaccine: Marland Kitchen Difficulty breathing  . Swelling of face and throat  . A fast heartbeat  . A bad rash all over body  . Dizziness and weakness   Immunizations Administered    Name Date Dose VIS Date Route   Pfizer COVID-19 Vaccine 03/02/2020  3:10 PM 0.3 mL 12/01/2019 Intramuscular   Manufacturer: [place]   Lot: [id number]   [place]: [id number]

## 2020-03-06 DIAGNOSIS — G4733 Obstructive sleep apnea (adult) (pediatric): Secondary | ICD-10-CM | POA: Diagnosis not present

## 2020-03-20 DIAGNOSIS — G4733 Obstructive sleep apnea (adult) (pediatric): Secondary | ICD-10-CM | POA: Diagnosis not present

## 2020-03-26 ENCOUNTER — Ambulatory Visit: Payer: Medicare Other | Attending: Internal Medicine

## 2020-03-26 DIAGNOSIS — Z23 Encounter for immunization: Secondary | ICD-10-CM

## 2020-03-26 NOTE — Progress Notes (Signed)
   Covid-19 Vaccination Clinic  Name:  Yvonne Long    MRN: [id number] DOB: 1951-06-20  03/26/2020  [name] was observed post Covid-19 immunization for 15 minutes without incident. She was provided with Vaccine Information Sheet and instruction to access the V-Safe system.   [name] was instructed to call 911 with any severe reactions post vaccine: Marland Kitchen Difficulty breathing  . Swelling of face and throat  . A fast heartbeat  . A bad rash all over body  . Dizziness and weakness   Immunizations Administered    Name Date Dose VIS Date Route   Pfizer COVID-19 Vaccine 03/26/2020  4:11 PM 0.3 mL 12/01/2019 Intramuscular   Manufacturer: [employer]   Lot: [id number]   [place]: [id number]

## 2020-04-06 DIAGNOSIS — G4733 Obstructive sleep apnea (adult) (pediatric): Secondary | ICD-10-CM | POA: Diagnosis not present

## 2020-04-18 ENCOUNTER — Encounter: Payer: Self-pay | Admitting: Family Medicine

## 2020-04-18 ENCOUNTER — Ambulatory Visit (INDEPENDENT_AMBULATORY_CARE_PROVIDER_SITE_OTHER): Payer: Medicare Other | Admitting: Family Medicine

## 2020-04-18 ENCOUNTER — Other Ambulatory Visit: Payer: Self-pay

## 2020-04-18 VITALS — BP 134/56 | HR 77 | Ht 66.0 in | Wt 212.0 lb

## 2020-04-18 DIAGNOSIS — M79675 Pain in left toe(s): Secondary | ICD-10-CM | POA: Diagnosis not present

## 2020-04-18 DIAGNOSIS — M79674 Pain in right toe(s): Secondary | ICD-10-CM | POA: Diagnosis not present

## 2020-04-18 MED ORDER — COLCHICINE 0.6 MG PO TABS: 0.6000 mg | ORAL_TABLET | Freq: Two times a day (BID) | ORAL | 0 refills | Status: DC | PRN

## 2020-04-18 NOTE — Progress Notes (Signed)
Area is red painful and swollen. She tried ice which made it feel worse. She then switched over to heat that gave her more relief along with IBU and elevation and wrapping.

## 2020-04-18 NOTE — Progress Notes (Signed)
Acute Office Visit  Subjective:    Patient ID: Yvonne Long, female    DOB: 1951-09-06, 69 y.o.   MRN: [id number]  Chief Complaint  Patient presents with  . Gout    in L Great toe    HPI  Patient is in today for pain in her left great toe. Area is red painful and swollen x couple of days.  She tried ice which made it feel worse. She then switched over to heat that gave her more relief along with IBU and elevation and wrapping.  She does not member any specific injury or trauma.  She is never had this happen before.  No known family history of gout.  Past Medical History:  Diagnosis Date  . Depression   . Diverticulum   . Primary localized osteoarthritis of left knee 11/02/2018    Past Surgical History:  Procedure Laterality Date  . ABDOMINAL HYSTERECTOMY  2005   Dr. Toy Cookey  . BILATERAL SALPINGOOPHORECTOMY  2005   Dr. Noralee Stain  . CHOLECYSTECTOMY  2013  . CYSTOCELE REPAIR  2005   with bladder tack  . SPINE SURGERY  2/11   Lumbar Laminectomy-Dr. Owens Shark  . TOTAL KNEE ARTHROPLASTY Left 11/14/2018  . TOTAL KNEE ARTHROPLASTY Left 11/14/2018   Procedure: left TOTAL KNEE ARTHROPLASTY;  Surgeon: Elsie Saas, MD;  Location: [facility];  Service: Orthopedics;  Laterality: Left;    Family History  Problem Relation Age of Onset  . Alcohol abuse Father   . Depression Father   . Peripheral vascular disease Father   . Thrombosis Father        deceased from thrombosis of leg.  Marland Kitchen Heart attack Other   . Hypertension Mother   . Cancer Other        Brain cancer  . Stroke Other   . Depression Son     Social History   Socioeconomic History  . Marital status: Married    Spouse name: Darnell Level  . Number of children: 2  . Years of education: Not on file  . Highest education level: Not on file  Occupational History  . Occupation: Web designer: [employer]  . Occupation: Freight forwarder  Tobacco Use  . Smoking status: Former Smoker    Quit date:  12/21/1994    Years since quitting: 25.3  . Smokeless tobacco: Never Used  Substance and Sexual Activity  . Alcohol use: Yes    Alcohol/week: 1.0 standard drinks    Types: 1 Glasses of wine per week    Comment: 1 alcoholic drink per day  . Drug use: No  . Sexual activity: Not on file  Other Topics Concern  . Not on file  Social History Narrative   1 caffeinated drink/day.  Enjoys yard work and yoga   Social Determinants of Radio broadcast assistant Strain:   . Difficulty of Paying Living Expenses:   Food Insecurity:   . Worried About Charity fundraiser in the Last Year:   . Arboriculturist in the Last Year:   Transportation Needs:   . Film/video editor (Medical):   Marland Kitchen Lack of Transportation (Non-Medical):   Physical Activity:   . Days of Exercise per Week:   . Minutes of Exercise per Session:   Stress:   . Feeling of Stress :   Social Connections:   . Frequency of Communication with Friends and Family:   . Frequency of Social Gatherings with Friends and Family:   .  Attends Religious Services:   . Active Member of Clubs or Organizations:   . Attends Archivist Meetings:   Marland Kitchen Marital Status:   Intimate Partner Violence:   . Fear of Current or Ex-Partner:   . Emotionally Abused:   Marland Kitchen Physically Abused:   . Sexually Abused:     Outpatient Medications Prior to Visit  Medication Sig Dispense Refill  . AMBULATORY NON FORMULARY MEDICATION Medication Name: CPAP with nasal pillows and humidifier.  Set to AutoPap 4-20 cm watere pressure setting for 10 days and then please fax his download so that we can set her pressure.  New diagnosis of obstructive sleep apnea with AHI of 14.2.  Was faxed to [facility] 1 vial 0  . atorvastatin (LIPITOR) 20 MG tablet TAKE ONE TABLET BY MOUTH EVERY NIGHT AT BEDTIME 90 tablet 0  . Cholecalciferol (EQL VITAMIN D3) 25 MCG (1000 UT) capsule Take 1,000 Units by mouth Yvonne.     Marland Kitchen PARoxetine (PAXIL) 30 MG tablet Take 1 tablet (30 mg  total) by mouth Yvonne. 90 tablet 1  . aspirin EC 325 MG EC tablet 1 tab a day for the next 30 days to prevent blood clots (Patient not taking: Reported on 10/15/2019) 30 tablet 0  . fluticasone (FLONASE) 50 MCG/ACT nasal spray SPRAY 2 SPRAYS INTO EACH NOSTRIL EVERY DAY (Patient not taking: Reported on 10/15/2019) 48 g 2  . valACYclovir (VALTREX) 1000 MG tablet Take 1 tablet (1,000 mg total) by mouth Yvonne. X 5 days PRN outbreak (Patient not taking: Reported on 10/15/2019) 30 tablet 11   No facility-administered medications prior to visit.    Allergies  Allergen Reactions  . Codeine Nausea Only and Other (See Comments)    On an empty stomach only    Review of Systems     Objective:    Physical Exam Vitals reviewed.  Constitutional:      Appearance: She is well-developed.  HENT:     Head: Normocephalic and atraumatic.  Eyes:     Conjunctiva/sclera: Conjunctivae normal.  Cardiovascular:     Rate and Rhythm: Normal rate.  Pulmonary:     Effort: Pulmonary effort is normal.  Musculoskeletal:     Comments: Left great toe is swollen and erythematous and tender at the base.  She is able to flex and extend the toe.  No rash.  Skin:    General: Skin is dry.     Coloration: Skin is not pale.  Neurological:     Mental Status: She is alert and oriented to person, place, and time.  Psychiatric:        Behavior: Behavior normal.     BP (!) 134/56   Pulse 77   Ht 5\' 6"  (1.676 m)   Wt 212 lb (96.2 kg)   SpO2 99%   BMI 34.22 kg/m  Wt Readings from Last 3 Encounters:  04/18/20 212 lb (96.2 kg)  07/17/19 212 lb (96.2 kg)  01/26/19 211 lb (95.7 kg)    There are no preventive care reminders to display for this patient.  There are no preventive care reminders to display for this patient.   Lab Results  Component Value Date   TSH 1.431 07/17/2015   Lab Results  Component Value Date   WBC 6.5 10/15/2019   HGB 14.2 10/15/2019   HCT 44.6 10/15/2019   MCV 92.5 10/15/2019    PLT 210 10/15/2019   Lab Results  Component Value Date   NA 136 10/15/2019   K  4.4 10/15/2019   CO2 26 10/15/2019   GLUCOSE 127 (H) 10/15/2019   BUN 12 10/15/2019   CREATININE 1.01 (H) 10/15/2019   BILITOT 0.4 10/15/2019   ALKPHOS 68 10/15/2019   AST 35 10/15/2019   ALT 45 (H) 10/15/2019   PROT 7.1 10/15/2019   ALBUMIN 3.7 10/15/2019   CALCIUM 8.6 (L) 10/15/2019   ANIONGAP 10 10/15/2019   Lab Results  Component Value Date   CHOL 155 07/18/2019   Lab Results  Component Value Date   HDL 63 07/18/2019   Lab Results  Component Value Date   LDLCALC 71 07/18/2019   Lab Results  Component Value Date   TRIG 124 07/18/2019   Lab Results  Component Value Date   CHOLHDL 2.5 07/18/2019   Lab Results  Component Value Date   HGBA1C 6.3 (H) 11/16/2018       Assessment & Plan:   Problem List Items Addressed This Visit    None    Visit Diagnoses    Pain of left great toe    -  Primary   Relevant Medications   colchicine 0.6 MG tablet   Other Relevant Orders   Uric acid   Sedimentation rate   CBC with Differential/Platelet     Left great toe pain-most consistent with gout.  Will check uric acid level, serum today.  Discussed treatment options and long-term consequences of gout.  Discussed low purine diet.  We will give her additional information based on the results.  We will go ahead and start colchicine for now to see if this improves her pain and discomfort. Without trauma will hold off on xray.     Meds ordered this encounter  Medications  . colchicine 0.6 MG tablet    Sig: Take 1 tablet (0.6 mg total) by mouth 2 (two) times Yvonne as needed.    Dispense:  60 tablet    Refill:  0     Beatrice Lecher, MD

## 2020-04-18 NOTE — Patient Instructions (Addendum)
Low-Purine Eating Plan A low-purine eating plan involves making food choices to limit your intake of purine. Purine is a kind of uric acid. Too much uric acid in your blood can cause certain conditions, such as gout and kidney stones. Eating a low-purine diet can help control these conditions. What are tips for following this plan? Reading food labels   Avoid foods with saturated or Trans fat.  Check the ingredient list of grains-based foods, such as bread and cereal, to make sure that they contain whole grains.  Check the ingredient list of sauces or soups to make sure they do not contain meat or fish.  When choosing soft drinks, check the ingredient list to make sure they do not contain high-fructose corn syrup. Shopping  Buy plenty of fresh fruits and vegetables.  Avoid buying canned or fresh fish.  Buy dairy products labeled as low-fat or nonfat.  Avoid buying premade or processed foods. These foods are often high in fat, salt (sodium), and added sugar. Cooking  Use olive oil instead of butter when cooking. Oils like olive oil, canola oil, and sunflower oil contain healthy fats. Meal planning  Learn which foods do or do not affect you. If you find out that a food tends to cause your gout symptoms to flare up, avoid eating that food. You can enjoy foods that do not cause problems. If you have any questions about a food item, talk with your dietitian or health care provider.  Limit foods high in fat, especially saturated fat. Fat makes it harder for your body to get rid of uric acid.  Choose foods that are lower in fat and are lean sources of protein. General guidelines  Limit alcohol intake to no more than 1 drink a day for nonpregnant women and 2 drinks a day for men. One drink equals 12 oz of beer, 5 oz of wine, or 1 oz of hard liquor. Alcohol can affect the way your body gets rid of uric acid.  Drink plenty of water to keep your urine clear or pale yellow. Fluids can help  remove uric acid from your body.  If directed by your health care provider, take a vitamin C supplement.  Work with your health care provider and dietitian to develop a plan to achieve or maintain a healthy weight. Losing weight can help reduce uric acid in your blood. What foods are recommended? The items listed may not be a complete list. Talk with your dietitian about what dietary choices are best for you. Foods low in purines Foods low in purines do not need to be limited. These include:  All fruits.  All low-purine vegetables, pickles, and olives.  Breads, pasta, rice, cornbread, and popcorn. Cake and other baked goods.  All dairy foods.  Eggs, nuts, and nut butters.  Spices and condiments, such as salt, herbs, and vinegar.  Plant oils, butter, and margarine.  Water, sugar-free soft drinks, tea, coffee, and cocoa.  Vegetable-based soups, broths, sauces, and gravies. Foods moderate in purines Foods moderate in purines should be limited to the amounts listed.   cup of asparagus, cauliflower, spinach, mushrooms, or green peas, each day.  2/3 cup uncooked oatmeal, each day.   cup dry wheat bran or wheat germ, each day.  2-3 ounces of meat or poultry, each day.  4-6 ounces of shellfish, such as crab, lobster, oysters, or shrimp, each day.  1 cup cooked beans, peas, or lentils, each day.  Soup, broths, or bouillon made from meat or   fish. Limit these foods as much as possible. What foods are not recommended? The items listed may not be a complete list. Talk with your dietitian about what dietary choices are best for you. Limit your intake of foods high in purines, including:  Beer and other alcohol.  Meat-based gravy or sauce.  Canned or fresh fish, such as: ? Anchovies, sardines, herring, and tuna. ? Mussels and scallops. ? Codfish, trout, and haddock.  Bacon.  Organ meats, such as: ? Liver or kidney. ? Tripe. ? Sweetbreads (thymus gland or  pancreas).  Wild game or goose.  Yeast or yeast extract supplements.  Drinks sweetened with high-fructose corn syrup. Summary  Eating a low-purine diet can help control conditions caused by too much uric acid in the body, such as gout or kidney stones.  Choose low-purine foods, limit alcohol, and limit foods high in fat.  You will learn over time which foods do or do not affect you. If you find out that a food tends to cause your gout symptoms to flare up, avoid eating that food. This information is not intended to replace advice given to you by your health care provider. Make sure you discuss any questions you have with your health care provider. Document Revised: 11/19/2017 Document Reviewed: 01/20/2017 Elsevier Patient Education  2020 [employer] Inc[employer] Gout  Gout is a condition that causes painful swelling of the joints. Gout is a type of inflammation of the joints (arthritis). This condition is caused by having too much uric acid in the body. Uric acid is a chemical that forms when the body breaks down substances called purines. Purines are important for building body proteins. When the body has too much uric acid, sharp crystals can form and build up inside the joints. This causes pain and swelling. Gout attacks can happen quickly and may be very painful (acute gout). Over time, the attacks can affect more joints and become more frequent (chronic gout). Gout can also cause uric acid to build up under the skin and inside the kidneys. What are the causes? This condition is caused by too much uric acid in your blood. This can happen because:  Your kidneys do not remove enough uric acid from your blood. This is the most common cause.  Your body makes too much uric acid. This can happen with some cancers and cancer treatments. It can also occur if your body is breaking down too many red blood cells (hemolytic anemia).  You eat too many foods that are high in purines. These foods include  organ meats and some seafood. Alcohol, especially beer, is also high in purines. A gout attack may be triggered by trauma or stress. What increases the risk? You are more likely to develop this condition if you:  Have a family history of gout.  Are female and middle-aged.  Are female and have gone through menopause.  Are obese.  Frequently drink alcohol, especially beer.  Are dehydrated.  Lose weight too quickly.  Have an organ transplant.  Have lead poisoning.  Take certain medicines, including aspirin, cyclosporine, diuretics, levodopa, and niacin.  Have kidney disease.  Have a skin condition called psoriasis. What are the signs or symptoms? An attack of acute gout happens quickly. It usually occurs in just one joint. The most common place is the big toe. Attacks often start at night. Other joints that may be affected include joints of the feet, ankle, knee, fingers, wrist, or elbow. Symptoms of this condition may include:  Severe pain.    Warmth.  Swelling.  Stiffness.  Tenderness. The affected joint may be very painful to touch.  Shiny, red, or purple skin.  Chills and fever. Chronic gout may cause symptoms more frequently. More joints may be involved. You may also have white or yellow lumps (tophi) on your hands or feet or in other areas near your joints. How is this diagnosed? This condition is diagnosed based on your symptoms, medical history, and physical exam. You may have tests, such as:  Blood tests to measure uric acid levels.  Removal of joint fluid with a thin needle (aspiration) to look for uric acid crystals.  X-rays to look for joint damage. How is this treated? Treatment for this condition has two phases: treating an acute attack and preventing future attacks. Acute gout treatment may include medicines to reduce pain and swelling, including:  NSAIDs.  Steroids. These are strong anti-inflammatory medicines that can be taken by mouth (orally) or  injected into a joint.  Colchicine. This medicine relieves pain and swelling when it is taken soon after an attack. It can be given by mouth or through an IV. Preventive treatment may include:  Daily use of smaller doses of NSAIDs or colchicine.  Use of a medicine that reduces uric acid levels in your blood.  Changes to your diet. You may need to see a dietitian about what to eat and drink to prevent gout. Follow these instructions at home: During a gout attack   If directed, put ice on the affected area: ? Put ice in a plastic bag. ? Place a towel between your skin and the bag. ? Leave the ice on for 20 minutes, 2-3 times a day.  Raise (elevate) the affected joint above the level of your heart as often as possible.  Rest the joint as much as possible. If the affected joint is in your leg, you may be given crutches to use.  Follow instructions from your health care provider about eating or drinking restrictions. Avoiding future gout attacks  Follow a low-purine diet as told by your dietitian or health care provider. Avoid foods and drinks that are high in purines, including liver, kidney, anchovies, asparagus, herring, mushrooms, mussels, and beer.  Maintain a healthy weight or lose weight if you are overweight. If you want to lose weight, talk with your health care provider. It is important that you do not lose weight too quickly.  Start or maintain an exercise program as told by your health care provider. Eating and drinking  Drink enough fluids to keep your urine pale yellow.  If you drink alcohol: ? Limit how much you use to:  0-1 drink a day for women.  0-2 drinks a day for men. ? Be aware of how much alcohol is in your drink. In the [place] one drink equals one 12 oz bottle of beer (355 mL) one 5 oz glass of wine (148 mL), or one 1 oz glass of hard liquor (44 mL). General instructions  Take over-the-counter and prescription medicines only as told by your health care  provider.  Do not drive or use heavy machinery while taking prescription pain medicine.  Return to your normal activities as told by your health care provider. Ask your health care provider what activities are safe for you.  Keep all follow-up visits as told by your health care provider. This is important. Contact a health care provider if you have:  Another gout attack.  Continuing symptoms of a gout attack after 10 days of   treatment.  Side effects from your medicines.  Chills or a fever.  Burning pain when you urinate.  Pain in your lower back or belly. Get help right away if you:  Have severe or uncontrolled pain.  Cannot urinate. Summary  Gout is painful swelling of the joints caused by inflammation.  The most common site of pain is the big toe, but it can affect other joints in the body.  Medicines and dietary changes can help to prevent and treat gout attacks. This information is not intended to replace advice given to you by your health care provider. Make sure you discuss any questions you have with your health care provider. Document Revised: 06/29/2018 Document Reviewed: 06/29/2018 Elsevier Patient Education  2020 Elsevier Inc.  

## 2020-04-19 LAB — CBC WITH DIFFERENTIAL/PLATELET
Absolute Monocytes: 606 cells/uL (ref 200–950)
Basophils Absolute: 42 cells/uL (ref 0–200)
Basophils Relative: 0.5 %
Eosinophils Absolute: 415 cells/uL (ref 15–500)
Eosinophils Relative: 5 %
HCT: 38.6 % (ref 35.0–45.0)
Hemoglobin: 12.8 g/dL (ref 11.7–15.5)
Lymphs Abs: 2258 cells/uL (ref 850–3900)
MCH: 29.4 pg (ref 27.0–33.0)
MCHC: 33.2 g/dL (ref 32.0–36.0)
MCV: 88.5 fL (ref 80.0–100.0)
MPV: 11.1 fL (ref 7.5–12.5)
Monocytes Relative: 7.3 %
Neutro Abs: 4980 cells/uL (ref 1500–7800)
Neutrophils Relative %: 60 %
Platelets: 245 10*3/uL (ref 140–400)
RBC: 4.36 10*6/uL (ref 3.80–5.10)
RDW: 12.6 % (ref 11.0–15.0)
Total Lymphocyte: 27.2 %
WBC: 8.3 10*3/uL (ref 3.8–10.8)

## 2020-04-19 LAB — SEDIMENTATION RATE: Sed Rate: 9 mm/h (ref 0–30)

## 2020-04-19 LAB — URIC ACID: Uric Acid, Serum: 5.9 mg/dL (ref 2.5–7.0)

## 2020-05-03 ENCOUNTER — Encounter: Payer: Self-pay | Admitting: Family Medicine

## 2020-05-03 NOTE — Telephone Encounter (Signed)
Routing to provider  

## 2020-05-06 DIAGNOSIS — G4733 Obstructive sleep apnea (adult) (pediatric): Secondary | ICD-10-CM | POA: Diagnosis not present

## 2020-05-07 NOTE — Telephone Encounter (Signed)
Per Angla, referral placed and patient is aware.

## 2020-05-07 NOTE — Telephone Encounter (Signed)
Per Levada Dy, referral has been placed

## 2020-05-10 ENCOUNTER — Other Ambulatory Visit: Payer: Self-pay | Admitting: Family Medicine

## 2020-05-10 DIAGNOSIS — M79675 Pain in left toe(s): Secondary | ICD-10-CM

## 2020-05-22 ENCOUNTER — Other Ambulatory Visit: Payer: Self-pay | Admitting: Family Medicine

## 2020-05-22 DIAGNOSIS — M79675 Pain in left toe(s): Secondary | ICD-10-CM

## 2020-05-31 ENCOUNTER — Other Ambulatory Visit: Payer: Self-pay | Admitting: Family Medicine

## 2020-05-31 DIAGNOSIS — F330 Major depressive disorder, recurrent, mild: Secondary | ICD-10-CM

## 2020-06-06 DIAGNOSIS — G4733 Obstructive sleep apnea (adult) (pediatric): Secondary | ICD-10-CM | POA: Diagnosis not present

## 2020-07-01 ENCOUNTER — Telehealth: Payer: Self-pay

## 2020-07-01 MED ORDER — AMOXICILLIN 500 MG PO CAPS: 500.0000 mg | ORAL_CAPSULE | Freq: Three times a day (TID) | ORAL | 0 refills | Status: DC

## 2020-07-01 NOTE — Telephone Encounter (Signed)
Patient called reporting her dentist is requiring her to pre-med prior to her dental cleaning since she had recent knee replacement. RX pended. Dental appointment tomorrow, 07/02/20

## 2020-07-01 NOTE — Telephone Encounter (Signed)
Med sent.

## 2020-07-02 NOTE — Telephone Encounter (Signed)
Patient advised.

## 2020-07-06 DIAGNOSIS — G4733 Obstructive sleep apnea (adult) (pediatric): Secondary | ICD-10-CM | POA: Diagnosis not present

## 2020-08-28 ENCOUNTER — Encounter: Payer: Self-pay | Admitting: Family Medicine

## 2020-09-26 ENCOUNTER — Encounter: Payer: Self-pay | Admitting: Family Medicine

## 2020-09-26 NOTE — Telephone Encounter (Signed)
Patient has been scheduled for flu shot for next week on Wednesday afternoon. AM

## 2020-10-02 ENCOUNTER — Ambulatory Visit (INDEPENDENT_AMBULATORY_CARE_PROVIDER_SITE_OTHER): Payer: Medicare Other | Admitting: Sports Medicine

## 2020-10-02 ENCOUNTER — Other Ambulatory Visit: Payer: Self-pay

## 2020-10-02 DIAGNOSIS — Z23 Encounter for immunization: Secondary | ICD-10-CM | POA: Diagnosis not present

## 2020-11-18 ENCOUNTER — Other Ambulatory Visit: Payer: Self-pay | Admitting: Family Medicine

## 2020-11-18 DIAGNOSIS — F330 Major depressive disorder, recurrent, mild: Secondary | ICD-10-CM

## 2020-11-28 ENCOUNTER — Ambulatory Visit (INDEPENDENT_AMBULATORY_CARE_PROVIDER_SITE_OTHER): Payer: Medicare Other | Admitting: Nurse Practitioner

## 2020-11-28 ENCOUNTER — Other Ambulatory Visit: Payer: Self-pay

## 2020-11-28 ENCOUNTER — Encounter: Payer: Self-pay | Admitting: Nurse Practitioner

## 2020-11-28 VITALS — BP 133/60 | HR 75 | Temp 97.9°F | Ht 66.0 in | Wt 217.7 lb

## 2020-11-28 DIAGNOSIS — R079 Chest pain, unspecified: Secondary | ICD-10-CM | POA: Diagnosis not present

## 2020-11-28 DIAGNOSIS — I498 Other specified cardiac arrhythmias: Secondary | ICD-10-CM | POA: Diagnosis not present

## 2020-11-28 DIAGNOSIS — E785 Hyperlipidemia, unspecified: Secondary | ICD-10-CM | POA: Diagnosis not present

## 2020-11-28 NOTE — Progress Notes (Signed)
Acute Office Visit  Subjective:    Patient ID: Yvonne Long, female    DOB: 1951-04-13, 69 y.o.   MRN: [id number]  Chief Complaint  Patient presents with  . Chest Pain    HPI Patient is in today for sensation of palpitations and upper back pain between the shoulder blades, chest tightness/shortness of breath during episodes that has been occurring for the past 2-3 weeks. She reports that she notices the symptoms more at night when she lays down for bed. She is not on any new medications. She does tell me that she has recently experienced diarrhea for several days. She does endorse daily wine consumption in the evening and caffeine in the morning. She is a former smoker.   CHEST PAIN Onset: sudden Character: heaviness in mid chest and feeling like she is short of breath Location: mid sternum Duration: minutes  Frequency: about every night Aggravating factors: none that she is aware of- notices more at night when relaxed Alleviating factors: none Accompanying symptoms: palpitation sensation Ever happened before: Once- she reports PVC's where the provider instructed her to perform valsalva maneuver and the symptoms resolved and did not reoccur.  Jaw pain: no Sweating: no Dizziness: no Left arm pain: no Uncontrolled BP: no Cough: no Lung condition: no GERD: no Worse when laying flat: no Cardiac history: none The 10-year ASCVD risk score Mikey Bussing DC Jr., et al., 2013) is: 7.2%   Values used to calculate the score:     Age: 43 years     Sex: Female     Is Non-Hispanic African American: No     Diabetic: No     Tobacco smoker: No     Systolic Blood Pressure: 503 mmHg     Is BP treated: No     HDL Cholesterol: 63 mg/dL     Total Cholesterol: 155 mg/dL    Past Medical History:  Diagnosis Date  . Depression   . Diverticulum   . Primary localized osteoarthritis of left knee 11/02/2018    Past Surgical History:  Procedure Laterality Date  . ABDOMINAL HYSTERECTOMY   2005   Dr. Toy Cookey  . BILATERAL SALPINGOOPHORECTOMY  2005   Dr. Noralee Stain  . CHOLECYSTECTOMY  2013  . CYSTOCELE REPAIR  2005   with bladder tack  . SPINE SURGERY  2/11   Lumbar Laminectomy-Dr. Owens Shark  . TOTAL KNEE ARTHROPLASTY Left 11/14/2018  . TOTAL KNEE ARTHROPLASTY Left 11/14/2018   Procedure: left TOTAL KNEE ARTHROPLASTY;  Surgeon: Elsie Saas, MD;  Location: [place];  Service: Orthopedics;  Laterality: Left;    Family History  Problem Relation Age of Onset  . Alcohol abuse Father   . Depression Father   . Peripheral vascular disease Father   . Thrombosis Father        deceased from thrombosis of leg.  Marland Kitchen Heart attack Other   . Hypertension Mother   . Cancer Other        Brain cancer  . Stroke Other   . Depression Son     Social History   Socioeconomic History  . Marital status: Married    Spouse name: Darnell Level  . Number of children: 2  . Years of education: Not on file  . Highest education level: Not on file  Occupational History  . Occupation: Web designer: [employer]  . Occupation: Freight forwarder  Tobacco Use  . Smoking status: Former Smoker    Quit date: 12/21/1994    Years since quitting:  25.9  . Smokeless tobacco: Never Used  Vaping Use  . Vaping Use: Never used  Substance and Sexual Activity  . Alcohol use: Yes    Alcohol/week: 1.0 standard drink    Types: 1 Glasses of wine per week    Comment: 1 alcoholic drink per day  . Drug use: No  . Sexual activity: Not on file  Other Topics Concern  . Not on file  Social History Narrative   1 caffeinated drink/day.  Enjoys yard work and yoga   Social Determinants of Radio broadcast assistant Strain: Not on file  Food Insecurity: Not on file  Transportation Needs: Not on file  Physical Activity: Not on file  Stress: Not on file  Social Connections: Not on file  Intimate Partner Violence: Not on file    Outpatient Medications Prior to Visit  Medication Sig Dispense Refill  .  AMBULATORY NON FORMULARY MEDICATION Medication Name: CPAP with nasal pillows and humidifier.  Set to AutoPap 4-20 cm watere pressure setting for 10 days and then please fax his download so that we can set her pressure.  New diagnosis of obstructive sleep apnea with AHI of 14.2.  Was faxed to [facility] 1 vial 0  . amoxicillin (AMOXIL) 500 MG capsule Take 1 capsule (500 mg total) by mouth 3 (three) times daily. Take 4 capsules by mouth 1 hour prior to dental procedure 4 capsule 0  . atorvastatin (LIPITOR) 20 MG tablet TAKE 1 TABLET BY MOUTH EVERYDAY AT BEDTIME 90 tablet 3  . BIOTIN PO Take 1 tablet by mouth daily.    Marland Kitchen CALCIUM PO Take 1 tablet by mouth daily.    . Cholecalciferol 25 MCG (1000 UT) capsule Take 1,000 Units by mouth daily.     Marland Kitchen EC-81 ASPIRIN PO Take 1 tablet by mouth daily.    . Multiple Vitamin tablet Take 1 tablet by mouth daily.    Marland Kitchen PARoxetine (PAXIL) 30 MG tablet TAKE 1 TABLET BY MOUTH EVERY DAY 90 tablet 0  . colchicine 0.6 MG tablet TAKE 1 TABLET BY MOUTH 2 TIMES A DAY AS NEEDED (Patient not taking: Reported on 11/28/2020) 180 tablet 1   No facility-administered medications prior to visit.    Allergies  Allergen Reactions  . Codeine Nausea Only and Other (See Comments)    On an empty stomach only    Review of Systems All review of systems negative except what is listed in the HPI     Objective:    Physical Exam Vitals and nursing note reviewed.  Constitutional:      Appearance: She is well-developed.  HENT:     Head: Normocephalic.  Cardiovascular:     Rate and Rhythm: Normal rate and regular rhythm.  No extrasystoles are present.    Chest Wall: PMI is not displaced.     Pulses:          Carotid pulses are 2+ on the right side and 2+ on the left side.      Radial pulses are 2+ on the right side and 2+ on the left side.       Posterior tibial pulses are 2+ on the right side and 2+ on the left side.     Heart sounds: Normal heart sounds. Heart sounds not  distant. No murmur heard. No friction rub. No gallop.   Pulmonary:     Effort: Pulmonary effort is normal.     Breath sounds: Normal breath sounds.  Chest:  Chest wall: No mass, tenderness, crepitus or edema. There is no dullness to percussion.  Abdominal:     General: Bowel sounds are normal.     Palpations: Abdomen is soft.  Musculoskeletal:        General: Normal range of motion.     Right lower leg: No tenderness. No edema.     Left lower leg: No tenderness. No edema.  Skin:    General: Skin is warm and dry.     Capillary Refill: Capillary refill takes less than 2 seconds.  Neurological:     General: No focal deficit present.     Mental Status: She is alert and oriented to person, place, and time.  Psychiatric:        Mood and Affect: Mood normal.        Behavior: Behavior normal.     BP 133/60   Pulse 75   Temp 97.9 F (36.6 C)   Ht 5\' 6"  (1.676 m)   Wt 217 lb 11.2 oz (98.7 kg)   SpO2 95%   BMI 35.14 kg/m  Wt Readings from Last 3 Encounters:  11/28/20 217 lb 11.2 oz (98.7 kg)  04/18/20 212 lb (96.2 kg)  07/17/19 212 lb (96.2 kg)    There are no preventive care reminders to display for this patient.  There are no preventive care reminders to display for this patient.   Lab Results  Component Value Date   TSH 1.431 07/17/2015   Lab Results  Component Value Date   WBC 8.3 04/18/2020   HGB 12.8 04/18/2020   HCT 38.6 04/18/2020   MCV 88.5 04/18/2020   PLT 245 04/18/2020   Lab Results  Component Value Date   NA 136 10/15/2019   K 4.4 10/15/2019   CO2 26 10/15/2019   GLUCOSE 127 (H) 10/15/2019   BUN 12 10/15/2019   CREATININE 1.01 (H) 10/15/2019   BILITOT 0.4 10/15/2019   ALKPHOS 68 10/15/2019   AST 35 10/15/2019   ALT 45 (H) 10/15/2019   PROT 7.1 10/15/2019   ALBUMIN 3.7 10/15/2019   CALCIUM 8.6 (L) 10/15/2019   ANIONGAP 10 10/15/2019   Lab Results  Component Value Date   CHOL 155 07/18/2019   Lab Results  Component Value Date   HDL  63 07/18/2019   Lab Results  Component Value Date   LDLCALC 71 07/18/2019   Lab Results  Component Value Date   TRIG 124 07/18/2019   Lab Results  Component Value Date   CHOLHDL 2.5 07/18/2019   Lab Results  Component Value Date   HGBA1C 6.3 (H) 11/16/2018       Assessment & Plan:   1. Sinus arrhythmia seen on electrocardiogram 2. Chest pain, unspecified type EKG performed in office today. There is evidence of sinus arrhythmia noted with low voltage QRS in precordial leads. No signs of ischemia or infarction present.  Review of previous results (tracings unavailable) reveal low voltage QRS were present in 2016. No other tracings available.  Pain descriptor more of pressure felt when palpitations are present.  Will obtain labs today to evaluate for possible electrolyte disturbance. Suspect intermittent PAC.  Consider long term cardiac monitoring for further evaluation based on lab results.   - EKG 12-Lead - COMPLETE METABOLIC PANEL WITH GFR - Magnesium   Orma Render, NP

## 2020-11-28 NOTE — Patient Instructions (Signed)
I am going to get some labs today to make sure your electrolytes are not out of balance causing some of the extra beats.   Premature Atrial Contraction  A premature atrial contraction [facility]) is a kind of irregular heartbeat (arrhythmia). It happens when the heart beats too Yomar Mejorado and then pauses before beating again. The heart has four areas, or chambers. Normally, electrical signals spread across the heart and make all the chambers beat together. During a PAC, the upper chambers of the heart (atria) beat too Prateek Knipple, before they have had time to fill with blood. The heartbeat pauses afterward so the heart can fill with blood for the next beat. Sometimes PAC can be a warning sign of another type of arrhythmia called atrial fibrillation. Atrial fibrillation may allow blood to pool in the atria and form clots. If a clot travels to the brain, it can cause a stroke. What are the causes? The cause of this condition is often unknown. Sometimes, this condition may be caused by heart disease or injury to the heart. What increases the risk? You are more likely to develop this condition if:  You are a child.  You are an adult who is 83 years of age or older. Episodes may be triggered by:  Caffeine.  Alcohol.  Tobacco use.  Stimulant drugs.  Some medicines or supplements.  Stress.  Heart disease. What are the signs or symptoms? Symptoms of this condition include:  A feeling that your heart skipped a beat. The first heartbeat after the "skipped" beat may feel more forceful.  A feeling that your heart is fluttering. How is this diagnosed? This condition is diagnosed based on:  Your symptoms.  A physical exam. Your health care provider may listen to your heart.  An electrocardiogram (ECG). This is a test that records the electrical impulses of the heart.  An ambulatory cardiac monitor. This device records your heartbeats for 24 hours or more. You may also have:  An echocardiogram to  check for any heart conditions. This is a type of imaging test that uses sound waves (ultrasound) to make images of your heart.  Blood tests. How is this treated? Treatment depends on the frequency of your symptoms and other risk factors. Treatments may include:  Medicines (beta-blockers).  Catheter ablation. This is done to destroy the part of the heart tissue that sends abnormal signals. In some cases, treatment may not be needed for this condition. Follow these instructions at home: Lifestyle  Do not use any products that contain nicotine or tobacco, such as cigarettes, e-cigarettes, and chewing tobacco. If you need help quitting, ask your health care provider.  Exercise regularly. Ask your health care provider what type of exercise is safe for you.  Find healthy ways to manage stress.  Try to get at least 7-9 hours of sleep each night, or as much as recommended by your health care provider. Alcohol use  Do not drink alcohol if: ? Your health care provider tells you not to drink. ? You are pregnant, may be pregnant, or are planning to become pregnant. ? Alcohol triggers your episodes.  If you drink alcohol: ? Limit how much you use to:  0-1 drink a day for women.  0-2 drinks a day for men. ? Be aware of how much alcohol is in your drink. In the [place] one drink equals one 12 oz bottle of beer (355 mL), one 5 oz glass of wine (148 mL), or one 1 oz glass of hard liquor (  44 mL). General instructions  Take over-the-counter and prescription medicines only as told by your health care provider.  If caffeine triggers episodes, do not eat, drink, or use anything with caffeine in it.  Keep all follow-up visits as told by your health care provider. This is important. Contact a health care provider if:  You feel your heart skipping beats.  Your heart skips beats and you feel dizzy, light-headed, or very tired. Get help right away if you have:  Chest pain.  Trouble  breathing.  Any symptoms of a stroke. "BE FAST" is an easy way to remember the main warning signs of a stroke. ? B - Balance. Signs are dizziness, sudden trouble walking, or loss of balance. ? E - Eyes. Signs are trouble seeing or a sudden change in vision. ? F - Face. Signs are sudden weakness or numbness of the face, or the face or eyelid drooping on one side. ? A - Arms. Signs are weakness or numbness in an arm. This happens suddenly and usually on one side of the body. ? S - Speech. Signs are sudden trouble speaking, slurred speech, or trouble understanding what people say. ? T - Time. Time to call emergency services. Write down what time symptoms started.  Other signs of stroke, such as: ? A sudden, severe headache with no known cause. ? Nausea or vomiting. ? Seizure. These symptoms may represent a serious problem that is an emergency. Do not wait to see if the symptoms will go away. Get medical help right away. Call your local emergency services (911 in the [place] Do not drive yourself to the hospital. Summary  A premature atrial contraction [facility]) is a kind of irregular heartbeat (arrhythmia). It happens when the heart beats too Zephan Beauchaine and then pauses before beating again.  Treatment depends on your symptoms and whether you have other underlying heart conditions.  Contact a health care provider if your heart skips beats and you feel dizzy, light-headed, or very tired.  In some cases, this condition may lead to a stroke. "BE FAST" is an easy way to remember the warning signs of stroke. Get help right away if you have any of the "BE FAST" signs. This information is not intended to replace advice given to you by your health care provider. Make sure you discuss any questions you have with your health care provider. Document Revised: 09/01/2018 Document Reviewed: 09/01/2018 Elsevier Patient Education  2020 [employer].

## 2020-12-01 LAB — COMPLETE METABOLIC PANEL WITH GFR
AG Ratio: 1.7 (calc) (ref 1.0–2.5)
ALT: 22 U/L (ref 6–29)
AST: 18 U/L (ref 10–35)
Albumin: 4.3 g/dL (ref 3.6–5.1)
Alkaline phosphatase (APISO): 66 U/L (ref 37–153)
BUN: 13 mg/dL (ref 7–25)
CO2: 27 mmol/L (ref 20–32)
Calcium: 9.3 mg/dL (ref 8.6–10.4)
Chloride: 107 mmol/L (ref 98–110)
Creat: 0.95 mg/dL (ref 0.50–0.99)
GFR, Est African American: 71 mL/min/{1.73_m2} (ref >=60)
GFR, Est Non African American: 62 mL/min/{1.73_m2} (ref >=60)
Globulin: 2.5 g/dL (calc) (ref 1.9–3.7)
Glucose, Bld: 108 mg/dL — ABNORMAL HIGH (ref 65–99)
Potassium: 4.1 mmol/L (ref 3.5–5.3)
Sodium: 142 mmol/L (ref 135–146)
Total Bilirubin: 0.3 mg/dL (ref 0.2–1.2)
Total Protein: 6.8 g/dL (ref 6.1–8.1)

## 2020-12-01 LAB — TSH+FREE T4
Free T4: 1.3 ng/dL (ref 0.8–1.8)
TSH: 0.93 mIU/L (ref 0.40–4.50)

## 2020-12-01 LAB — MAGNESIUM: Magnesium: 2 mg/dL (ref 1.5–2.5)

## 2020-12-02 NOTE — Progress Notes (Signed)
Thyroid panel came back normal, which is reassuring. How are your symptoms?

## 2020-12-03 ENCOUNTER — Encounter: Payer: Self-pay | Admitting: Nurse Practitioner

## 2020-12-06 ENCOUNTER — Telehealth: Payer: Self-pay

## 2020-12-06 DIAGNOSIS — R002 Palpitations: Secondary | ICD-10-CM

## 2020-12-06 DIAGNOSIS — R079 Chest pain, unspecified: Secondary | ICD-10-CM

## 2020-12-06 NOTE — Telephone Encounter (Signed)
Angie called and left a message stating she would like to proceed with heart monitor.

## 2020-12-06 NOTE — Telephone Encounter (Signed)
Referral placed.

## 2020-12-09 NOTE — Telephone Encounter (Signed)
Patient advised.

## 2020-12-23 NOTE — Progress Notes (Signed)
Cardiology Office Note:    Date:  12/24/2020   ID:  Yvonne Long, DOB [birth year], MRN [id number]  PCP:  Hali Marry, MD  Cardiologist:  Shirlee More, MD   Referring MD: Orma Render, NP  ASSESSMENT:    1. Premature atrial contractions   2. Hyperlipidemia, unspecified hyperlipidemia type    PLAN:    In order of problems listed above:  1. She is having frequent APCs that are symptomatic may well be proarrhythmic from over-the-counter allergy medication.  I asked her to avoid all these products except either plain Claritin or Zyrtec not the combination D and she could also take Mucinex or Robitussin-DM and avoid all over-the-counter decongestants cold sinus allergy and p.m. medications.  After discussion we will utilize a 1 week ZIO monitor to decide if she has a more severe arrhythmia that would require drug therapy.  If reassuring I will see her back in the office as needed or if unimproved. 2. Stable continue her statin  Next appointment as needed if I need to bring her back to the office I will contact her after her monitor.  She refers this approach   Medication Adjustments/Labs and Tests Ordered: Current medicines are reviewed at length with the patient today.  Concerns regarding medicines are outlined above.  No orders of the defined types were placed in this encounter.  No orders of the defined types were placed in this encounter.    Chief Complaint  Patient presents with  . Palpitations    With frequent APCs on her EKG     History of Present Illness:    Yvonne Long is a 70 y.o. female who is being seen today for the evaluation of palpitation at the request of Early, Coralee Pesa, NP.  Her EKG performed 11/28/2020 showed frequent atrial premature contractions  She has noticed her heart beating it feels irregular its not rapid its not sustained or severe but it was enough that she brought to the attention of her primary care physician. She has  no known history of congenital or rheumatic heart disease and no family history of atrial fibrillation. 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 She has a very minor feeling of awareness in her left chest with palpitation. She does take over-the-counter allergy medication which may be proarrhythmic. Laboratory studies showed normal BMP Past Medical History:  Diagnosis Date  . Chronic kidney disease (CKD) stage G3b/A1, moderately decreased glomerular filtration rate (GFR) between 30-44 mL/min/1.73 square meter and albuminuria creatinine ratio less than 30 mg/g (HCC) 03/19/2016  . Depression   . Diverticulum   . Herpes simplex infection 07/17/2019   Oral and genital  . Hyperlipidemia 07/24/2008   Qualifier: Diagnosis of  By: Hulda Humphrey    . Left knee pain 11/20/2015  . Major depressive disorder, recurrent episode ([place]) 09/28/2006   Qualifier: Diagnosis of  By: Madilyn Fireman MD, Barnetta Chapel    . OSA (obstructive sleep apnea) 11/11/2018   Study 10/2018 IMPRESSIONS - Mild obstructive sleep apnea occurred during this study (AHI = 14.2/h). - No significant central sleep apnea occurred during this study (CAI = 0.0/h). - Oxygen desaturation was noted during this study (Min O2 = 85%). - Patient snored.  . Palpitations 07/05/2007   Qualifier: Diagnosis of  By: Madilyn Fireman MD, Barnetta Chapel    . Prediabetes 04/16/2015  . Primary localized osteoarthritis of left knee 11/02/2018  .  Thoracic back pain 02/12/2016    Past Surgical History:  Procedure Laterality Date  . ABDOMINAL HYSTERECTOMY  2005   Dr. Toy Cookey  . BILATERAL SALPINGOOPHORECTOMY  2005   Dr. Noralee Stain  . CHOLECYSTECTOMY  2013  . CYSTOCELE REPAIR  2005   with bladder tack  . SPINE SURGERY  2/11   Lumbar Laminectomy-Dr. Owens Shark  . TOTAL KNEE ARTHROPLASTY Left 11/14/2018  . TOTAL KNEE ARTHROPLASTY Left 11/14/2018    Procedure: left TOTAL KNEE ARTHROPLASTY;  Surgeon: Elsie Saas, MD;  Location: [place];  Service: Orthopedics;  Laterality: Left;    Current Medications: Current Meds  Medication Sig  . AMBULATORY NON FORMULARY MEDICATION Medication Name: CPAP with nasal pillows and humidifier.  Set to AutoPap 4-20 cm watere pressure setting for 10 days and then please fax his download so that we can set her pressure.  New diagnosis of obstructive sleep apnea with AHI of 14.2.  Was faxed to [facility]  . atorvastatin (LIPITOR) 20 MG tablet TAKE 1 TABLET BY MOUTH EVERYDAY AT BEDTIME  . BIOTIN PO Take 1 tablet by mouth daily.  . Chlorpheniramine Maleate (ALLERGY RELIEF PO) Take 50 mg by mouth every 4 (four) hours as needed ([facility]).  . Cholecalciferol 25 MCG (1000 UT) capsule Take 1,000 Units by mouth daily.   Marland Kitchen EC-81 ASPIRIN PO Take 1 tablet by mouth daily.  . Glucosamine HCl 1500 MG TABS Take 2 tablets by mouth daily.  Marland Kitchen PARoxetine (PAXIL) 30 MG tablet TAKE 1 TABLET BY MOUTH EVERY DAY     Allergies:   Codeine   Social History   Socioeconomic History  . Marital status: Married    Spouse name: Darnell Level  . Number of children: 2  . Years of education: Not on file  . Highest education level: Not on file  Occupational History  . Occupation: Web designer: [employer]  . Occupation: Freight forwarder  Tobacco Use  . Smoking status: Former Smoker    Quit date: 12/21/1994    Years since quitting: 26.0  . Smokeless tobacco: Never Used  Vaping Use  . Vaping Use: Never used  Substance and Sexual Activity  . Alcohol use: Yes    Alcohol/week: 1.0 standard drink    Types: 1 Glasses of wine per week    Comment: 1 alcoholic drink per day  . Drug use: No  . Sexual activity: Not on file  Other Topics Concern  . Not on file  Social History Narrative   1 caffeinated drink/day.  Enjoys yard work and yoga   Social Determinants of Radio broadcast assistant Strain: Not on file  Food  Insecurity: Not on file  Transportation Needs: Not on file  Physical Activity: Not on file  Stress: Not on file  Social Connections: Not on file     Family History: The patient's family history includes Alcohol abuse in her father; Cancer in an other family member; Depression in her father and son; Heart attack in an other family member; Hypertension in her mother; Peripheral vascular disease in her father; Stroke in an other family member; Thrombosis in her father.  ROS:   ROS Please see the history of present illness.     All other systems reviewed and are negative.  EKGs/Labs/Other Studies Reviewed:    The following studies were reviewed today:   Recent Labs: 04/18/2020: Hemoglobin 12.8; Platelets 245 11/28/2020: ALT 22; BUN 13; Creat 0.95; Magnesium 2.0; Potassium 4.1; Sodium 142; TSH 0.93  Recent  Lipid Panel    Component Value Date/Time   CHOL 155 07/18/2019 0808   TRIG 124 07/18/2019 0808   HDL 63 07/18/2019 0808   CHOLHDL 2.5 07/18/2019 0808   VLDL 31 (H) 07/17/2015 0806   LDLCALC 71 07/18/2019 0808    Physical Exam:    VS:  BP 138/72   Pulse (!) 58   Ht 5\' 6"  (1.676 m)   Wt 214 lb 1.9 oz (97.1 kg)   SpO2 96%   BMI 34.56 kg/m     Wt Readings from Last 3 Encounters:  12/24/20 214 lb 1.9 oz (97.1 kg)  11/28/20 217 lb 11.2 oz (98.7 kg)  04/18/20 212 lb (96.2 kg)     GEN:  Well nourished, well developed in no acute distress HEENT: Normal NECK: No JVD; No carotid bruits LYMPHATICS: No lymphadenopathy CARDIAC: RRR, no murmurs, rubs, gallops RESPIRATORY:  Clear to auscultation without rales, wheezing or rhonchi  ABDOMEN: Soft, non-tender, non-distended MUSCULOSKELETAL:  No edema; No deformity  SKIN: Warm and dry NEUROLOGIC:  Alert and oriented x 3 PSYCHIATRIC:  Normal affect     Signed, 04/20/20, MD  12/24/2020 10:53 AM    [facility]

## 2020-12-24 ENCOUNTER — Ambulatory Visit: Payer: Medicare Other | Admitting: Cardiology

## 2020-12-24 ENCOUNTER — Ambulatory Visit (INDEPENDENT_AMBULATORY_CARE_PROVIDER_SITE_OTHER): Payer: Medicare Other

## 2020-12-24 ENCOUNTER — Other Ambulatory Visit: Payer: Self-pay

## 2020-12-24 VITALS — BP 138/72 | HR 58 | Ht 66.0 in | Wt 214.1 lb

## 2020-12-24 DIAGNOSIS — I491 Atrial premature depolarization: Secondary | ICD-10-CM

## 2020-12-24 DIAGNOSIS — E785 Hyperlipidemia, unspecified: Secondary | ICD-10-CM | POA: Diagnosis not present

## 2020-12-24 DIAGNOSIS — F32A Depression, unspecified: Secondary | ICD-10-CM | POA: Insufficient documentation

## 2020-12-24 NOTE — Patient Instructions (Addendum)
Medication Instructions:  Your physician has recommended you make the following change in your medication: Please discontinue your over the counter allergy medications.  *If you need a refill on your cardiac medications before your next appointment, please call your pharmacy*   Lab Work: None If you have labs (blood work) drawn today and your tests are completely normal, you will receive your results only by: Marland Kitchen MyChart Message (if you have MyChart) OR . A paper copy in the mail If you have any lab test that is abnormal or we need to change your treatment, we will call you to review the results.   Testing/Procedures: A zio monitor was ordered today. It will remain on for 7 days. You will then return monitor and event diary in provided box. It takes 1-2 weeks for report to be downloaded and returned to [place]. We will call you with the results. If monitor falls off or has orange flashing light, please call Zio for further instructions.      Follow-Up: At [facility], you and your health needs are our priority.  As part of our continuing mission to provide you with exceptional heart care, we have created designated Provider Care Teams.  These Care Teams include your primary Cardiologist (physician) and Advanced Practice Providers (APPs -  Physician Assistants and Nurse Practitioners) who all work together to provide you with the care you need, when you need it.  We recommend signing up for the patient portal called "MyChart".  Sign up information is provided on this After Visit Summary.  MyChart is used to connect with patients for Virtual Visits (Telemedicine).  Patients are able to view lab/test results, encounter notes, upcoming appointments, etc.  Non-urgent messages can be sent to your provider as well.   To learn more about what you can do with MyChart, go to [email or web address].    Your next appointment:   As needed The format for your next appointment:   In Person  Provider:    Norman Herrlich, MD   Other Instructions 1. Avoid all over-the-counter antihistamines except Claritin/Loratadine and Zyrtec/Cetrizine. 2. Avoid all combination including cold sinus allergies flu decongestant and sleep medications 3. You can use Robitussin DM Mucinex and Mucinex DM for cough. 4. can use Tylenol aspirin ibuprofen and naproxen but no combinations such as sleep or sinus.

## 2020-12-24 NOTE — Progress Notes (Signed)
Thank you for seeing Yvonne Long. I appreciate your input!  [name], DNP, AGNP-c

## 2020-12-31 ENCOUNTER — Telehealth: Payer: Self-pay

## 2020-12-31 NOTE — Telephone Encounter (Signed)
Yvonne Long called and states due to her knee replacement she will need pre-treatment of an antibiotic prior to all dental procedures. She has an appointment on Thursday.

## 2021-01-01 MED ORDER — AMOXICILLIN 500 MG PO CAPS: 500.0000 mg | ORAL_CAPSULE | Freq: Three times a day (TID) | ORAL | 0 refills | Status: DC

## 2021-01-01 NOTE — Telephone Encounter (Signed)
Patient aware of recommendation and will reach out to her surgeon for further clarification.

## 2021-01-01 NOTE — Telephone Encounter (Signed)
This is your patient. I know with my patients I ask them to ask surgeon because it is not a generic recommendation any more it is only certain patients with knee replacements and other risk. I don't know if you have done this before for patient?

## 2021-01-01 NOTE — Telephone Encounter (Signed)
Please let patient know that I Georgina Peer go ahead and send in the antibiotic this time but she really needs to check with her surgeon.  It is not recommended to do antibiotics before procedures anymore for patients who have had joint replacement.  Heart valve replacement is the only indication right now that we really do this for this is not typical and increase her risk for complications and side effects from her current antibiotic use so I really encouraged her to reach out to her surgeon and let us know if there is a special reason that they are recommending that she have prophylaxis with antibiotics before dental procedures.

## 2021-01-08 DIAGNOSIS — I491 Atrial premature depolarization: Secondary | ICD-10-CM | POA: Diagnosis not present

## 2021-01-09 ENCOUNTER — Telehealth: Payer: Self-pay

## 2021-01-09 NOTE — Telephone Encounter (Signed)
Left message on patients voicemail to please return our call.   

## 2021-01-09 NOTE — Telephone Encounter (Signed)
-----   Message from Brian J Munley, MD sent at 01/09/2021 11:20 AM EST ----- Her monitor shows brief episodes of rapid heart rhythm and I like to put her on a low-dose of a beta-blocker Toprol-XL 25 mg daily.  This problem is more of a nuisance than being concerning and we can decide if we keep her on longer therapy at office follow-up.  Continue to avoid over-the-counter proarrhythmic drugs as we discussed in the office   

## 2021-01-10 ENCOUNTER — Telehealth: Payer: Self-pay

## 2021-01-10 MED ORDER — METOPROLOL SUCCINATE ER 25 MG PO TB24: 25.0000 mg | ORAL_TABLET | Freq: Every day | ORAL | 3 refills | Status: DC

## 2021-01-10 NOTE — Telephone Encounter (Signed)
Spoke with patient regarding results and recommendation.  Patient verbalizes understanding and is agreeable to plan of care. Advised patient to call back with any issues or concerns.  

## 2021-01-10 NOTE — Telephone Encounter (Signed)
-----   Message from Richardo Priest, MD sent at 01/09/2021 11:20 AM EST ----- Her monitor shows brief episodes of rapid heart rhythm and I like to put her on a low-dose of a beta-blocker Toprol-XL 25 mg daily.  This problem is more of a nuisance than being concerning and we can decide if we keep her on longer therapy at office follow-up.  Continue to avoid over-the-counter proarrhythmic drugs as we discussed in the office

## 2021-02-05 DIAGNOSIS — H2513 Age-related nuclear cataract, bilateral: Secondary | ICD-10-CM | POA: Diagnosis not present

## 2021-02-09 ENCOUNTER — Other Ambulatory Visit: Payer: Self-pay | Admitting: Family Medicine

## 2021-02-09 DIAGNOSIS — F330 Major depressive disorder, recurrent, mild: Secondary | ICD-10-CM

## 2021-03-06 ENCOUNTER — Encounter: Payer: Self-pay | Admitting: Family Medicine

## 2021-03-06 ENCOUNTER — Telehealth (INDEPENDENT_AMBULATORY_CARE_PROVIDER_SITE_OTHER): Payer: Medicare Other | Admitting: Family Medicine

## 2021-03-06 VITALS — Temp 98.7°F

## 2021-03-06 DIAGNOSIS — J019 Acute sinusitis, unspecified: Secondary | ICD-10-CM

## 2021-03-06 MED ORDER — AMOXICILLIN-POT CLAVULANATE 875-125 MG PO TABS: 1.0000 | ORAL_TABLET | Freq: Two times a day (BID) | ORAL | 0 refills | Status: DC

## 2021-03-06 NOTE — Progress Notes (Signed)
She stated that she had been taking care of her grandchild that had the "sniffles" one week ago and she is now having sxs.   Chills/sweats for a few nights. She now has fullness in her chest, sinus facial pain and bilateral ear pain.  Hasn't been around anyone who has had covid.

## 2021-03-06 NOTE — Progress Notes (Signed)
Virtual Visit via Video Note  I connected with Yvonne Long on 03/06/21 at  2:40 PM EDT by a video enabled telemedicine application and verified that I am speaking with the correct person using two identifiers.   I discussed the limitations of evaluation and management by telemedicine and the availability of in person appointments. The patient expressed understanding and agreed to proceed.  Patient location: at home  Provider location: in office  Subjective:    CC: Sinus sxs    HPI: Around one of her grand kids last week that had a runny nose.  She started feeling bad 8 days ago.  Had fever and chills initially. + nasal congestion.  + cough, productive.  Using nasal saline and Robitussin. Having left ear pain and pain on the left side of her face. Says didn't test for COVID. No GI upset. Appetite is fair.    Past medical history, Surgical history, Family history not pertinant except as noted below, Social history, Allergies, and medications have been entered into the medical record, reviewed, and corrections made.   Review of Systems: No fevers, chills, night sweats, weight loss, chest pain, or shortness of breath.   Objective:    General: Speaking clearly in complete sentences without any shortness of breath.  Alert and oriented x3.  Normal judgment. No apparent acute distress.    Impression and Recommendations:    No problem-specific Assessment & Plan notes found for this encounter.  Acute sinusitis will tx with augmentin since also having left ear pain. Could have OM but hard to say without exam.  Likely started as virus but she feels she is getting worse. Call if not better after the weekend.      Time spent in encounter 15 minutes  I discussed the assessment and treatment plan with the patient. The patient was provided an opportunity to ask questions and all were answered. The patient agreed with the plan and demonstrated an understanding of the instructions.   The  patient was advised to call back or seek an in-person evaluation if the symptoms worsen or if the condition fails to improve as anticipated.   Beatrice Lecher, MD

## 2021-03-12 ENCOUNTER — Ambulatory Visit (INDEPENDENT_AMBULATORY_CARE_PROVIDER_SITE_OTHER): Payer: Medicare Other | Admitting: Family Medicine

## 2021-03-12 DIAGNOSIS — Z13820 Encounter for screening for osteoporosis: Secondary | ICD-10-CM | POA: Diagnosis not present

## 2021-03-12 DIAGNOSIS — Z780 Asymptomatic menopausal state: Secondary | ICD-10-CM

## 2021-03-12 DIAGNOSIS — Z0000 Encounter for general adult medical examination without abnormal findings: Secondary | ICD-10-CM | POA: Diagnosis not present

## 2021-03-12 DIAGNOSIS — Z1231 Encounter for screening mammogram for malignant neoplasm of breast: Secondary | ICD-10-CM | POA: Diagnosis not present

## 2021-03-12 NOTE — Progress Notes (Signed)
MEDICARE ANNUAL WELLNESS VISIT  03/12/2021  Telephone Visit Disclaimer This Medicare AWV was conducted by telephone due to national recommendations for restrictions regarding the COVID-19 Pandemic (e.g. social distancing).  I verified, using two identifiers, that I am speaking with Yvonne [name] or their authorized healthcare agent. I discussed the limitations, risks, security, and privacy concerns of performing an evaluation and management service by telephone and the potential availability of an in-person appointment in the future. The patient expressed understanding and agreed to proceed.  Location of Patient: Home Location of Provider (nurse):  In the office.  Subjective:    Yvonne [name] is a 70 y.o. female patient of Metheney, Rene Kocher, MD who had a Medicare Annual Wellness Visit today via telephone. Yvonne [name] is Working part time and lives with their spouse. she has 2 children. she reports that she is socially active and does interact with friends/family regularly. she is minimally physically active and enjoys reading, yoga and yardwork.  Patient Care Team: Hali Marry, MD as PCP - General  Advanced Directives 03/12/2021 10/15/2019 11/14/2018 11/04/2018  Does Patient Have a Medical Advance Directive? Yes No Yes No  Type of Paramedic of [place];Living will - [name];Living will -  Does patient want to make changes to medical advance directive? No - Patient declined - No - Patient declined -  Copy of [place] in Chart? No - copy requested - Yes - validated most recent copy scanned in chart (See row information) -  Would patient like information on creating a medical advance directive? - No - Patient declined No - Patient declined Yes (MAU/Ambulatory/Procedural Areas - Information given)    Hospital Utilization Over the Past 12 Months: # of hospitalizations or ER visits: 0 # of surgeries:  0  Review of Systems    Patient reports that her overall health is unchanged compared to last year.  History obtained from chart review and the patient  Patient Reported Readings (BP, Pulse, CBG, Weight, etc) none  Pain Assessment Pain : No/denies pain     Current Medications & Allergies (verified) Allergies as of 03/12/2021      Reactions   Codeine Nausea Only, Other (See Comments)   On an empty stomach only      Medication List       Accurate as of March 12, 2021 10:20 AM. If you have any questions, ask your nurse or doctor.        ALLERGY RELIEF PO Take 50 mg by mouth every 4 (four) hours as needed ([facility]).   AMBULATORY NON FORMULARY MEDICATION Medication Name: CPAP with nasal pillows and humidifier.  Set to AutoPap 4-20 cm watere pressure setting for 10 days and then please fax his download so that we can set her pressure.  New diagnosis of obstructive sleep apnea with AHI of 14.2.  Was faxed to [facility]   amoxicillin-clavulanate 875-125 MG tablet Commonly known as: AUGMENTIN Take 1 tablet by mouth 2 (two) times daily.   atorvastatin 20 MG tablet Commonly known as: LIPITOR TAKE 1 TABLET BY MOUTH EVERYDAY AT BEDTIME   BIOTIN PO Take 1 tablet by mouth daily.   Cholecalciferol 25 MCG (1000 UT) capsule Take 1,000 Units by mouth daily.   EC-81 ASPIRIN PO Take 1 tablet by mouth daily.   Glucosamine HCl 1500 MG Tabs Take 2 tablets by mouth daily.   metoprolol succinate 25 MG 24 hr tablet Commonly known as: Toprol XL Take 1 tablet (  25 mg total) by mouth daily.   PARoxetine 30 MG tablet Commonly known as: PAXIL TAKE 1 TABLET BY MOUTH EVERY DAY       History (reviewed): Past Medical History:  Diagnosis Date  . Chronic kidney disease (CKD) stage G3b/A1, moderately decreased glomerular filtration rate (GFR) between 30-44 mL/min/1.73 square meter and albuminuria creatinine ratio less than 30 mg/g (HCC) 03/19/2016  . Depression   . Diverticulum   .  Herpes simplex infection 07/17/2019   Oral and genital  . Hyperlipidemia 07/24/2008   Qualifier: Diagnosis of  By: Hulda Humphrey    . Left knee pain 11/20/2015  . Major depressive disorder, recurrent episode ([place]) 09/28/2006   Qualifier: Diagnosis of  By: Madilyn Fireman MD, Barnetta Chapel    . OSA (obstructive sleep apnea) 11/11/2018   Study 10/2018 IMPRESSIONS - Mild obstructive sleep apnea occurred during this study (AHI = 14.2/h). - No significant central sleep apnea occurred during this study (CAI = 0.0/h). - Oxygen desaturation was noted during this study (Min O2 = 85%). - Patient snored.  . Palpitations 07/05/2007   Qualifier: Diagnosis of  By: Madilyn Fireman MD, Barnetta Chapel    . Prediabetes 04/16/2015  . Primary localized osteoarthritis of left knee 11/02/2018  . Thoracic back pain 02/12/2016   Past Surgical History:  Procedure Laterality Date  . ABDOMINAL HYSTERECTOMY  2005   Dr. Toy Cookey  . BILATERAL SALPINGOOPHORECTOMY  2005   Dr. Noralee Stain  . CHOLECYSTECTOMY  2013  . CYSTOCELE REPAIR  2005   with bladder tack  . SPINE SURGERY  2/11   Lumbar Laminectomy-Dr. Owens Shark  . TOTAL KNEE ARTHROPLASTY Left 11/14/2018  . TOTAL KNEE ARTHROPLASTY Left 11/14/2018   Procedure: left TOTAL KNEE ARTHROPLASTY;  Surgeon: Elsie Saas, MD;  Location: [facility];  Service: Orthopedics;  Laterality: Left;   Family History  Problem Relation Age of Onset  . Alcohol abuse Father   . Depression Father   . Peripheral vascular disease Father   . Thrombosis Father        deceased from thrombosis of leg.  Marland Kitchen Heart attack Other   . Hypertension Mother   . Cancer Other        Brain cancer  . Stroke Other   . Depression Son    Social History   Socioeconomic History  . Marital status: Married    Spouse name: Darnell Level  . Number of children: 2  . Years of education: 16  . Highest education level: Bachelor's degree (e.g., BA, AB, BS)  Occupational History  . 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 Occupation: Freight forwarder    Comment: Part-time  Tobacco Use  . Smoking status: Former Smoker    Quit date: 12/21/1994    Years since quitting: 26.2  . Smokeless tobacco: Never Used  Vaping Use  . Vaping Use: Never used  Substance and Sexual Activity  . Alcohol use: Yes    Alcohol/week: 1.0 standard drink    Types: 1 Glasses of wine per week    Comment: 1 alcoholic drink per day  . Drug use: No  . Sexual activity: Not Currently    Birth control/protection: Post-menopausal  Other Topics Concern  . Not on file  Social History Narrative   1 caffeinated drink/day.  Enjoys yard work and yoga. Lives with her husband.    Social Determinants of Health   Financial Resource Strain: Low Risk   . Difficulty of Paying Living Expenses: Not  hard at all  Food Insecurity: No Food Insecurity  . Worried About Charity fundraiser in the Last Year: Never true  . Ran Out of Food in the Last Year: Never true  Transportation Needs: No Transportation Needs  . Lack of Transportation (Medical): No  . Lack of Transportation (Non-Medical): No  Physical Activity: Inactive  . Days of Exercise per Week: 0 days  . Minutes of Exercise per Session: 0 min  Stress: No Stress Concern Present  . Feeling of Stress : Not at all  Social Connections: Moderately Integrated  . Frequency of Communication with Friends and Family: More than three times a week  . Frequency of Social Gatherings with Friends and Family: Twice a week  . Attends Religious Services: More than 4 times per year  . Active Member of Clubs or Organizations: No  . Attends Archivist Meetings: Never  . Marital Status: Married    Activities of Daily Living In your present state of health, do you have any difficulty performing the following activities: 03/12/2021  Hearing? N  Vision? N  Difficulty concentrating or making decisions? N  Walking or climbing stairs? N  Dressing or bathing? N  Doing errands, shopping?  N  Preparing Food and eating ? N  Using the Toilet? N  In the past six months, have you accidently leaked urine? N  Do you have problems with loss of bowel control? N  Managing your Medications? N  Managing your Finances? N  Housekeeping or managing your Housekeeping? N  Some recent data might be hidden    Patient Education/ Literacy How often do you need to have someone help you when you read instructions, pamphlets, or other written materials from your doctor or pharmacy?: 1 - Never What is the last grade level you completed in school?: Bachelor's Degree  Exercise Current Exercise Habits: The patient does not participate in regular exercise at present, Exercise limited by: None identified  Diet Patient reports consuming 2 meals a day and 2 snack(s) a day Patient reports that her primary diet is: Regular Patient reports that she does have regular access to food.   Depression Screen PHQ 2/9 Scores 03/12/2021 04/18/2020 07/18/2019 09/15/2018 01/27/2018 08/27/2017 03/19/2016  PHQ - 2 Score 0 0 2 1 0 0 1  PHQ- 9 Score - 0 4 - 3 - 3     Fall Risk Fall Risk  03/12/2021 07/18/2019 01/27/2018 02/22/2014  Falls in the past year? 1 0 No No  Number falls in past yr: 1 0 - -  Injury with Fall? 0 0 - -  Risk for fall due to : History of fall(s) - - -  Follow up Falls evaluation completed;Education provided;Falls prevention discussed - - -     Objective:  Yvonne [name] seemed alert and oriented and she participated appropriately during our telephone visit.  Blood Pressure Weight BMI  BP Readings from Last 3 Encounters:  12/24/20 138/72  11/28/20 133/60  04/18/20 (!) 134/56   Wt Readings from Last 3 Encounters:  12/24/20 214 lb 1.9 oz (97.1 kg)  11/28/20 217 lb 11.2 oz (98.7 kg)  04/18/20 212 lb (96.2 kg)   BMI Readings from Last 1 Encounters:  12/24/20 34.56 kg/m    *Unable to obtain current vital signs, weight, and BMI due to telephone visit type  Hearing/Vision  . Yvonne [name] did  not seem to have difficulty with hearing/understanding during the telephone conversation . Reports that she has had a formal eye exam by  an eye care professional within the past year . Reports that she has not had a formal hearing evaluation within the past year *Unable to fully assess hearing and vision during telephone visit type  Cognitive Function: 6CIT Screen 03/12/2021  What Year? 0 points  What month? 0 points  What time? 0 points  Count back from 20 0 points  Months in reverse 0 points  Repeat phrase 0 points  Total Score 0   (Normal:0-7, Significant for Dysfunction: >8)  Normal Cognitive Function Screening: Yes   Immunization & Health Maintenance Record Immunization History  Administered Date(s) Administered  . Fluad Quad(high Dose 65+) 10/03/2019, 10/02/2020  . Influenza Split 10/15/2014  . Influenza Whole 10/04/2007, 09/11/2008, 11/25/2009, 09/22/2010  . Influenza, High Dose Seasonal PF 10/13/2017, 09/15/2018  . Influenza-Unspecified 09/20/2013, 09/24/2013  . PFIZER(Purple Top)SARS-COV-2 Vaccination 03/02/2020, 03/26/2020, 12/01/2020  . Pneumococcal Polysaccharide-23 01/26/2019  . Tdap 12/04/2007, 01/26/2019  . Zoster 07/17/2015    Health Maintenance  Topic Date Due  . DEXA SCAN  04/18/2021 (Originally 09/29/2017)  . PNA vac Low Risk Adult (2 of 2 - PCV13) 04/18/2021 (Originally 01/27/2020)  . MAMMOGRAM  11/28/2021 (Originally 06/28/2020)  . Fecal DNA (Cologuard)  06/29/2022  . TETANUS/TDAP  01/26/2029  . INFLUENZA VACCINE  Completed  . COVID-19 Vaccine  Completed  . Hepatitis C Screening  Completed  . HPV VACCINES  Aged Out       Assessment  This is a routine wellness examination for Yvonne [name].  Health Maintenance: Due or Overdue There are no preventive care reminders to display for this patient.  Yvonne [name] does not need a referral for Community Assistance: Care Management:   no Social Work:    no Prescription  Assistance:  no Nutrition/Diabetes Education:  no   Plan:  Personalized Goals Goals Addressed              This Visit's Progress   .  Patient Stated (pt-stated)        03/12/2021 AWV Goal: Exercise for General Health   Patient will verbalize understanding of the benefits of increased physical activity:  Exercising regularly is important. It will improve your overall fitness, flexibility, and endurance.  Regular exercise also will improve your overall health. It can help you control your weight, reduce stress, and improve your bone density.  Over the next year, patient will increase physical activity as tolerated with a goal of at least 150 minutes of moderate physical activity per week.   You can tell that you are exercising at a moderate intensity if your heart starts beating faster and you start breathing faster but can still hold a conversation.  Moderate-intensity exercise ideas include:  Walking 1 mile (1.6 km) in about 15 minutes  Biking  Hiking  Golfing  Dancing  Water aerobics  Patient will verbalize understanding of everyday activities that increase physical activity by providing examples like the following: ? Yard work, such as: ? Pushing a Conservation officer, nature ? Raking and bagging leaves ? Washing your car ? Pushing a stroller ? Shoveling snow ? Gardening ? Washing windows or floors  Patient will be able to explain general safety guidelines for exercising:   Before you start a new exercise program, talk with your health care provider.  Do not exercise so much that you hurt yourself, feel dizzy, or get very short of breath.  Wear comfortable clothes and wear shoes with good support.  Drink plenty of water while you exercise to prevent dehydration or heat stroke.  Work out until your breathing and your heartbeat get faster.       Personalized Health Maintenance & Screening Recommendations  Pneumococcal vaccine  Screening mammography Bone densitometry  screening  Lung Cancer Screening Recommended: no (Low Dose CT Chest recommended if Age 41-80 years, 30 pack-year currently smoking OR have quit w/in past 15 years) Hepatitis C Screening recommended: no HIV Screening recommended: no  Advanced Directives: Written information was not prepared per patient's request.  Referrals & Orders Orders Placed This Encounter  Procedures  . DEXAScan  . Mammogram 3D SCREEN BREAST BILATERAL    Follow-up Plan . Follow-up with Hali Marry, MD as planned . Schedule your nurse visit for your pneumonia vaccine. . Dexa scan and mammogram referral has been sent and they will call you to schedule.  . Medicare wellness in one year.   I have personally reviewed and noted the following in the patient's chart:   . Medical and social history . Use of alcohol, tobacco or illicit drugs  . Current medications and supplements . Functional ability and status . Nutritional status . Physical activity . Advanced directives . List of other physicians . Hospitalizations, surgeries, and ER visits in previous 12 months . Vitals . Screenings to include cognitive, depression, and falls . Referrals and appointments  In addition, I have reviewed and discussed with Yvonne [name] certain preventive protocols, quality metrics, and best practice recommendations. A written personalized care plan for preventive services as well as general preventive health recommendations is available and can be mailed to the patient at her request.      Tinnie Gens, RN  03/12/2021

## 2021-03-12 NOTE — Patient Instructions (Addendum)
[place] Maintenance Summary and Written Plan of Care  [name] ,  Thank you for allowing me to perform your Medicare Annual Wellness Visit and for your ongoing commitment to your health.   Health Maintenance & Immunization History Health Maintenance  Topic Date Due  . DEXA SCAN  04/18/2021 (Originally 09/29/2017)  . PNA vac Low Risk Adult (2 of 2 - PCV13) 04/18/2021 (Originally 01/27/2020)  . MAMMOGRAM  11/28/2021 (Originally 06/28/2020)  . Fecal DNA (Cologuard)  06/29/2022  . TETANUS/TDAP  01/26/2029  . INFLUENZA VACCINE  Completed  . COVID-19 Vaccine  Completed  . Hepatitis C Screening  Completed  . HPV VACCINES  Aged Out   Immunization History  Administered Date(s) Administered  . Fluad Quad(high Dose 65+) 10/03/2019, 10/02/2020  . Influenza Split 10/15/2014  . Influenza Whole 10/04/2007, 09/11/2008, 11/25/2009, 09/22/2010  . Influenza, High Dose Seasonal PF 10/13/2017, 09/15/2018  . Influenza-Unspecified 09/20/2013, 09/24/2013  . PFIZER(Purple Top)SARS-COV-2 Vaccination 03/02/2020, 03/26/2020, 12/01/2020  . Pneumococcal Polysaccharide-23 01/26/2019  . Tdap 12/04/2007, 01/26/2019  . Zoster 07/17/2015    These are the patient goals that we discussed: Goals Addressed              This Visit's Progress   .  Patient Stated (pt-stated)        03/12/2021 AWV Goal: Exercise for General Health   Patient will verbalize understanding of the benefits of increased physical activity:  Exercising regularly is important. It will improve your overall fitness, flexibility, and endurance.  Regular exercise also will improve your overall health. It can help you control your weight, reduce stress, and improve your bone density.  Over the next year, patient will increase physical activity as tolerated with a goal of at least 150 minutes of moderate physical activity per week.   You can tell that you are exercising at a moderate intensity if  your heart starts beating faster and you start breathing faster but can still hold a conversation.  Moderate-intensity exercise ideas include:  Walking 1 mile (1.6 km) in about 15 minutes  Biking  Hiking  Golfing  Dancing  Water aerobics  Patient will verbalize understanding of everyday activities that increase physical activity by providing examples like the following: ? Yard work, such as: ? Pushing a Conservation officer, nature ? Raking and bagging leaves ? Washing your car ? Pushing a stroller ? Shoveling snow ? Gardening ? Washing windows or floors  Patient will be able to explain general safety guidelines for exercising:   Before you start a new exercise program, talk with your health care provider.  Do not exercise so much that you hurt yourself, feel dizzy, or get very short of breath.  Wear comfortable clothes and wear shoes with good support.  Drink plenty of water while you exercise to prevent dehydration or heat stroke.  Work out until your breathing and your heartbeat get faster.         This is a list of Health Maintenance Items that are overdue or due now: There are no preventive care reminders to display for this patient.   Orders/Referrals Placed Today: Orders Placed This Encounter  Procedures  . DEXAScan    Standing Status:   Future    Standing Expiration Date:   03/12/2022    Scheduling Instructions:     Please call patient to schedule    Order Specific Question:   Reason for exam:    Answer:   Post menopausal / osteoporosis screen  Order Specific Question:   Preferred imaging location?    Answer:   [facility]  . Mammogram 3D SCREEN BREAST BILATERAL    Standing Status:   Future    Standing Expiration Date:   03/12/2022    Scheduling Instructions:     Please call patient to schedule    Order Specific Question:   Reason for Exam (SYMPTOM  OR DIAGNOSIS REQUIRED)    Answer:   breast cancer screen    Order Specific Question:   Preferred imaging  location?    Answer:   [facility]   (Contact our referral department at [phone] if you have not spoken with someone about your referral appointment within the next 5 days)    Follow-up Plan . Follow-up with Hali Marry, MD as planned . Schedule your nurse visit for your pneumonia vaccine. . Dexa scan and mammogram referral has been sent and they will call you to schedule.  . Medicare wellness in one year.  Bone Density Test A bone density test uses a type of X-ray to measure the amount of calcium and other minerals in a person's bones. It can measure bone density in the hip and the spine. The test is similar to having a regular X-ray. This test may also be called:  Bone densitometry.  Bone mineral density test.  Dual-energy X-ray absorptiometry (DEXA). You may have this test to:  Diagnose a condition that causes weak or thin bones (osteoporosis).  Screen you for osteoporosis.  Predict your risk for a broken bone (fracture).  Determine how well your osteoporosis treatment is working. Tell a health care provider about:  Any allergies you have.  All medicines you are taking, including vitamins, herbs, eye drops, creams, and over-the-counter medicines.  Any problems you or family members have had with anesthetic medicines.  Any blood disorders you have.  Any surgeries you have had.  Any medical conditions you have.  Whether you are pregnant or may be pregnant.  Any medical tests you have had within the past 14 days that used contrast material. What are the risks? Generally, this is a safe test. However, it does expose you to a small amount of radiation, which can slightly increase your cancer risk. What happens before the test?  Do not take any calcium supplements within the 24 hours before your test.  You will need to remove all metal jewelry, eyeglasses, removable dental appliances, and any other metal objects on your body. What happens  during the test?  You will lie down on an exam table. There will be an X-ray generator below you and an imaging device above you.  Other devices, such as boxes or braces, may be used to position your body properly for the scan.  The machine will slowly scan your body. You will need to keep very still while the machine does the scan.  The images will show up on a screen in the room. Images will be examined by a specialist after your test is finished. The procedure may vary among health care providers and hospitals.   What can I expect after the test? It is up to you to get the results of your test. Ask your health care provider, or the department that is doing the test, when your results will be ready. Summary  A bone density test is an imaging test that uses a type of X-ray to measure the amount of calcium and other minerals in your bones.  The test may be used  to diagnose or screen you for a condition that causes weak or thin bones (osteoporosis), predict your risk for a broken bone (fracture), or determine how well your osteoporosis treatment is working.  Do not take any calcium supplements within 24 hours before your test.  Ask your health care provider, or the department that is doing the test, when your results will be ready. This information is not intended to replace advice given to you by your health care provider. Make sure you discuss any questions you have with your health care provider. Document Revised: 05/23/2020 Document Reviewed: 05/23/2020 Elsevier Patient Education  [place] Maintenance, Female Adopting a healthy lifestyle and getting preventive care are important in promoting health and wellness. Ask your health care provider about:  The right schedule for you to have regular tests and exams.  Things you can do on your own to prevent diseases and keep yourself healthy. What should I know about diet, weight, and exercise? Eat a healthy  diet  Eat a diet that includes plenty of vegetables, fruits, low-fat dairy products, and lean protein.  Do not eat a lot of foods that are high in solid fats, added sugars, or sodium.   Maintain a healthy weight Body mass index (BMI) is used to identify weight problems. It estimates body fat based on height and weight. Your health care provider can help determine your BMI and help you achieve or maintain a healthy weight. Get regular exercise Get regular exercise. This is one of the most important things you can do for your health. Most adults should:  Exercise for at least 150 minutes each week. The exercise should increase your heart rate and make you sweat (moderate-intensity exercise).  Do strengthening exercises at least twice a week. This is in addition to the moderate-intensity exercise.  Spend less time sitting. Even light physical activity can be beneficial. Watch cholesterol and blood lipids Have your blood tested for lipids and cholesterol at 69 years of age, then have this test every 5 years. Have your cholesterol levels checked more often if:  Your lipid or cholesterol levels are high.  You are older than 69 years of age.  You are at high risk for heart disease. What should I know about cancer screening? Depending on your health history and family history, you may need to have cancer screening at various ages. This may include screening for:  Breast cancer.  Cervical cancer.  Colorectal cancer.  Skin cancer.  Lung cancer. What should I know about heart disease, diabetes, and high blood pressure? Blood pressure and heart disease  High blood pressure causes heart disease and increases the risk of stroke. This is more likely to develop in people who have high blood pressure readings, are of African descent, or are overweight.  Have your blood pressure checked: ? Every 3-5 years if you are 38-26 years of age. ? Every year if you are 20 years old or  older. Diabetes Have regular diabetes screenings. This checks your fasting blood sugar level. Have the screening done:  Once every three years after age 66 if you are at a normal weight and have a low risk for diabetes.  More often and at a younger age if you are overweight or have a high risk for diabetes. What should I know about preventing infection? Hepatitis B If you have a higher risk for hepatitis B, you should be screened for this virus. Talk with your health care provider to find out  if you are at risk for hepatitis B infection. Hepatitis C Testing is recommended for:  Everyone born from [birth year] through 1964.  Anyone with known risk factors for hepatitis C. Sexually transmitted infections (STIs)  Get screened for STIs, including gonorrhea and chlamydia, if: ? You are sexually active and are younger than 69 years of age. ? You are older than 69 years of age and your health care provider tells you that you are at risk for this type of infection. ? Your sexual activity has changed since you were last screened, and you are at increased risk for chlamydia or gonorrhea. Ask your health care provider if you are at risk.  Ask your health care provider about whether you are at high risk for HIV. Your health care provider may recommend a prescription medicine to help prevent HIV infection. If you choose to take medicine to prevent HIV, you should first get tested for HIV. You should then be tested every 3 months for as long as you are taking the medicine. Pregnancy  If you are about to stop having your period (premenopausal) and you may become pregnant, seek counseling before you get pregnant.  Take 400 to 800 micrograms (mcg) of folic acid every day if you become pregnant.  Ask for birth control (contraception) if you want to prevent pregnancy. Osteoporosis and menopause Osteoporosis is a disease in which the bones lose minerals and strength with aging. This can result in bone fractures.  If you are 72 years old or older, or if you are at risk for osteoporosis and fractures, ask your health care provider if you should:  Be screened for bone loss.  Take a calcium or vitamin D supplement to lower your risk of fractures.  Be given hormone replacement therapy (HRT) to treat symptoms of menopause. Follow these instructions at home: Lifestyle  Do not use any products that contain nicotine or tobacco, such as cigarettes, e-cigarettes, and chewing tobacco. If you need help quitting, ask your health care provider.  Do not use street drugs.  Do not share needles.  Ask your health care provider for help if you need support or information about quitting drugs. Alcohol use  Do not drink alcohol if: ? Your health care provider tells you not to drink. ? You are pregnant, may be pregnant, or are planning to become pregnant.  If you drink alcohol: ? Limit how much you use to 0-1 drink a day. ? Limit intake if you are breastfeeding.  Be aware of how much alcohol is in your drink. In the [place] one drink equals one 12 oz bottle of beer (355 mL), one 5 oz glass of wine (148 mL), or one 1 oz glass of hard liquor (44 mL). General instructions  Schedule regular health, dental, and eye exams.  Stay current with your vaccines.  Tell your health care provider if: ? You often feel depressed. ? You have ever been abused or do not feel safe at home. Summary  Adopting a healthy lifestyle and getting preventive care are important in promoting health and wellness.  Follow your health care provider's instructions about healthy diet, exercising, and getting tested or screened for diseases.  Follow your health care provider's instructions on monitoring your cholesterol and blood pressure. This information is not intended to replace advice given to you by your health care provider. Make sure you discuss any questions you have with your health care provider. Document Revised: 11/30/2018  Document Reviewed: 11/30/2018 Elsevier Patient Education  2021  [place] A mammogram is a low energy X-ray of the breasts that is done to check for abnormal changes. This procedure can screen for and detect any changes that may indicate breast cancer. Mammograms are regularly done on women. A man may have a mammogram if he has a lump or swelling in his breast. A mammogram can also identify other changes and variations in the breast, such as:  Inflammation of the breast tissue (mastitis).  An infected area that contains a collection of pus (abscess).  A fluid-filled sac (cyst).  Fibrocystic changes. This is when breast tissue becomes denser, which can make the tissue feel rope-like or uneven under the skin.  Tumors that are not cancerous (benign). Tell a health care provider:  About any allergies you have.  If you have breast implants.  If you have had previous breast disease, biopsy, or surgery.  If you are breastfeeding.  If you are younger than age 60.  If you have a family history of breast cancer.  Whether you are pregnant or may be pregnant. What are the risks? Generally, this is a safe procedure. However, problems may occur, including:  Exposure to radiation. Radiation levels are very low with this test.  The results being misinterpreted.  The need for further tests.  The inability of the mammogram to detect certain cancers. What happens before the procedure?  Schedule your test about 1-2 weeks after your menstrual period if you are still menstruating. This is usually when your breasts are the least tender.  If you have had a mammogram done at a different facility in the past, get the mammogram X-rays or have them sent to your current exam facility. The new and old images will be compared.  Wash your breasts and underarms on the day of the test.  Do not wear deodorants, perfumes, lotions, or powders anywhere on your body on the day of the  test.  Remove any jewelry from your neck.  Wear clothes that you can change into and out of easily. What happens during the procedure?  You will undress from the waist up and put on a gown that opens in the front.  You will stand in front of the X-ray machine.  Each breast will be placed between two plastic or glass plates. The plates will compress your breast for a few seconds. Try to stay as relaxed as possible during the procedure. This does not cause any harm to your breasts and any discomfort you feel will be very brief.  X-rays will be taken from different angles of each breast. The procedure may vary among health care providers and hospitals.   What happens after the procedure?  The mammogram will be examined by a specialist (radiologist).  You may need to repeat certain parts of the test, depending on the quality of the images. This is commonly done if the radiologist needs a better view of the breast tissue.  You may resume your normal activities.  It is up to you to get the results of your procedure. Ask your health care provider, or the department that is doing the procedure, when your results will be ready. Summary  A mammogram is a low energy X-ray of the breasts that is done to check for abnormal changes. A man may have a mammogram if he has a lump or swelling in his breast.  If you have had a mammogram done at a different facility in the past, get the mammogram X-rays or  have them sent to your current exam facility in order to compare them.  Schedule your test about 1-2 weeks after your menstrual period if you are still menstruating.  For this test, each breast will be placed between two plastic or glass plates. The plates will compress your breast for a few seconds.  Ask when your test results will be ready. Make sure you get your test results. This information is not intended to replace advice given to you by your health care provider. Make sure you discuss any  questions you have with your health care provider. Document Revised: 07/28/2018 Document Reviewed: 07/28/2018 Elsevier Patient Education  [place].

## 2021-03-13 ENCOUNTER — Telehealth: Payer: Self-pay | Admitting: Family Medicine

## 2021-03-13 NOTE — Telephone Encounter (Signed)
Pt called. She states that someone called her from this office about getting her Shingles shot. Please confirm. *Please forward your response to [employer].  Thanks.

## 2021-03-14 NOTE — Telephone Encounter (Signed)
They would like her to schedule a nurse visit for a pneumonia vaccine. Thanks!

## 2021-03-17 NOTE — Telephone Encounter (Signed)
Called pt and scheduled and appt 04/02/2021 @ 2:20 tvt

## 2021-03-19 MED ORDER — VERAPAMIL HCL ER 180 MG PO TBCR: 180.0000 mg | EXTENDED_RELEASE_TABLET | Freq: Every day | ORAL | 3 refills | Status: DC

## 2021-04-02 ENCOUNTER — Ambulatory Visit (INDEPENDENT_AMBULATORY_CARE_PROVIDER_SITE_OTHER): Payer: Medicare Other

## 2021-04-02 ENCOUNTER — Other Ambulatory Visit: Payer: Self-pay

## 2021-04-02 ENCOUNTER — Ambulatory Visit (INDEPENDENT_AMBULATORY_CARE_PROVIDER_SITE_OTHER): Payer: Medicare Other | Admitting: Sports Medicine

## 2021-04-02 VITALS — BP 140/64 | HR 59 | Temp 98.8°F | Resp 20 | Ht 66.0 in | Wt 210.0 lb

## 2021-04-02 DIAGNOSIS — Z1231 Encounter for screening mammogram for malignant neoplasm of breast: Secondary | ICD-10-CM

## 2021-04-02 DIAGNOSIS — Z780 Asymptomatic menopausal state: Secondary | ICD-10-CM | POA: Diagnosis not present

## 2021-04-02 DIAGNOSIS — Z13820 Encounter for screening for osteoporosis: Secondary | ICD-10-CM | POA: Diagnosis not present

## 2021-04-02 DIAGNOSIS — Z23 Encounter for immunization: Secondary | ICD-10-CM

## 2021-04-02 DIAGNOSIS — Z0000 Encounter for general adult medical examination without abnormal findings: Secondary | ICD-10-CM

## 2021-04-02 NOTE — Progress Notes (Signed)
Established Patient Office Visit  Subjective:  Patient ID: Yvonne Long, female    DOB: 24-Dec-1950  Age: 70 y.o. MRN: [id number]  CC:  Chief Complaint  Patient presents with  . Immunizations    HPI Yvonne Long presents for her second pneumonia vaccine. Vaccine tolerated well, given in left deltoid without any apparent complications.   Past Medical History:  Diagnosis Date  . Chronic kidney disease (CKD) stage G3b/A1, moderately decreased glomerular filtration rate (GFR) between 30-44 mL/min/1.73 square meter and albuminuria creatinine ratio less than 30 mg/g (HCC) 03/19/2016  . Depression   . Diverticulum   . Herpes simplex infection 07/17/2019   Oral and genital  . Hyperlipidemia 07/24/2008   Qualifier: Diagnosis of  By: Hulda Humphrey    . Left knee pain 11/20/2015  . Major depressive disorder, recurrent episode ([place]) 09/28/2006   Qualifier: Diagnosis of  By: Madilyn Fireman MD, Barnetta Chapel    . OSA (obstructive sleep apnea) 11/11/2018   Study 10/2018 IMPRESSIONS - Mild obstructive sleep apnea occurred during this study (AHI = 14.2/h). - No significant central sleep apnea occurred during this study (CAI = 0.0/h). - Oxygen desaturation was noted during this study (Min O2 = 85%). - Patient snored.  . Palpitations 07/05/2007   Qualifier: Diagnosis of  By: Madilyn Fireman MD, Barnetta Chapel    . Prediabetes 04/16/2015  . Primary localized osteoarthritis of left knee 11/02/2018  . Thoracic back pain 02/12/2016    Past Surgical History:  Procedure Laterality Date  . ABDOMINAL HYSTERECTOMY  2005   Dr. Toy Cookey  . BILATERAL SALPINGOOPHORECTOMY  2005   Dr. Noralee Stain  . CHOLECYSTECTOMY  2013  . CYSTOCELE REPAIR  2005   with bladder tack  . SPINE SURGERY  2/11   Lumbar Laminectomy-Dr. Owens Shark  . TOTAL KNEE ARTHROPLASTY Left 11/14/2018  . TOTAL KNEE ARTHROPLASTY Left 11/14/2018   Procedure: left TOTAL KNEE ARTHROPLASTY;  Surgeon: Elsie Saas, MD;  Location: [facility];  Service:  Orthopedics;  Laterality: Left;    Family History  Problem Relation Age of Onset  . Alcohol abuse Father   . Depression Father   . Peripheral vascular disease Father   . Thrombosis Father        deceased from thrombosis of leg.  Marland Kitchen Heart attack Other   . Hypertension Mother   . Cancer Other        Brain cancer  . Stroke Other   . Depression Son     Social History   Socioeconomic History  . Marital status: Married    Spouse name: Darnell Level  . Number of children: 2  . Years of education: 16  . Highest education level: Bachelor's degree (e.g., BA, AB, BS)  Occupational History  . 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 Occupation: Freight forwarder    Comment: Part-time  Tobacco Use  . Smoking status: Former Smoker    Quit date: 12/21/1994    Years since quitting: 26.2  . Smokeless tobacco: Never Used  Vaping Use  . Vaping Use: Never used  Substance and Sexual Activity  . Alcohol use: Yes    Alcohol/week: 1.0 standard drink    Types: 1 Glasses of wine per week    Comment: 1 alcoholic drink per day  . Drug use: No  . Sexual activity: Not Currently    Birth control/protection: Post-menopausal  Other Topics Concern  . Not on file  Social History Narrative   1 caffeinated drink/day.  Enjoys yard work and yoga. Lives with her husband.    Social Determinants of Health   Financial Resource Strain: Low Risk   . Difficulty of Paying Living Expenses: Not hard at all  Food Insecurity: No Food Insecurity  . Worried About Charity fundraiser in the Last Year: Never true  . Ran Out of Food in the Last Year: Never true  Transportation Needs: No Transportation Needs  . Lack of Transportation (Medical): No  . Lack of Transportation (Non-Medical): No  Physical Activity: Inactive  . Days of Exercise per Week: 0 days  . Minutes of Exercise per Session: 0 min  Stress: No Stress Concern Present  . Feeling of Stress : Not at all  Social  Connections: Moderately Integrated  . Frequency of Communication with Friends and Family: More than three times a week  . Frequency of Social Gatherings with Friends and Family: Twice a week  . Attends Religious Services: More than 4 times per year  . Active Member of Clubs or Organizations: No  . Attends Archivist Meetings: Never  . Marital Status: Married  Human resources officer Violence: Not At Risk  . Fear of Current or Ex-Partner: No  . Emotionally Abused: No  . Physically Abused: No  . Sexually Abused: No    Outpatient Medications Prior to Visit  Medication Sig Dispense Refill  . AMBULATORY NON FORMULARY MEDICATION Medication Name: CPAP with nasal pillows and humidifier.  Set to AutoPap 4-20 cm watere pressure setting for 10 days and then please fax his download so that we can set her pressure.  New diagnosis of obstructive sleep apnea with AHI of 14.2.  Was faxed to [facility] 1 vial 0  . amoxicillin-clavulanate (AUGMENTIN) 875-125 MG tablet Take 1 tablet by mouth 2 (two) times daily. (Patient not taking: Reported on 03/12/2021) 14 tablet 0  . atorvastatin (LIPITOR) 20 MG tablet TAKE 1 TABLET BY MOUTH EVERYDAY AT BEDTIME 90 tablet 3  . BIOTIN PO Take 1 tablet by mouth daily.    . Chlorpheniramine Maleate (ALLERGY RELIEF PO) Take 50 mg by mouth every 4 (four) hours as needed ([facility]). (Patient not taking: Reported on 03/12/2021)    . Cholecalciferol 25 MCG (1000 UT) capsule Take 1,000 Units by mouth daily.     Marland Kitchen EC-81 ASPIRIN PO Take 1 tablet by mouth daily.    . Glucosamine HCl 1500 MG TABS Take 2 tablets by mouth daily.    . metoprolol succinate (TOPROL XL) 25 MG 24 hr tablet Take 1 tablet (25 mg total) by mouth daily. 90 tablet 3  . PARoxetine (PAXIL) 30 MG tablet TAKE 1 TABLET BY MOUTH EVERY DAY 90 tablet 0  . verapamil (CALAN-SR) 180 MG CR tablet Take 1 tablet (180 mg total) by mouth at bedtime. 90 tablet 3   No facility-administered medications prior to visit.     Allergies  Allergen Reactions  . Codeine Nausea Only and Other (See Comments)    On an empty stomach only    ROS Review of Systems    Objective:    Physical Exam  BP 140/64 (BP Location: Left Arm, Patient Position: Sitting, Cuff Size: Normal)   Pulse (!) 59   Temp 98.8 F (37.1 C) (Oral)   Resp 20   Ht 5\' 6"  (1.676 m)   Wt 210 lb (95.3 kg)   SpO2 99%   BMI 33.89 kg/m  Wt Readings from Last 3 Encounters:  04/02/21 210 lb (95.3 kg)  12/24/20 214 lb  1.9 oz (97.1 kg)  11/28/20 217 lb 11.2 oz (98.7 kg)     There are no preventive care reminders to display for this patient.  There are no preventive care reminders to display for this patient.  Lab Results  Component Value Date   TSH 0.93 11/28/2020   Lab Results  Component Value Date   WBC 8.3 04/18/2020   HGB 12.8 04/18/2020   HCT 38.6 04/18/2020   MCV 88.5 04/18/2020   PLT 245 04/18/2020   Lab Results  Component Value Date   NA 142 11/28/2020   K 4.1 11/28/2020   CO2 27 11/28/2020   GLUCOSE 108 (H) 11/28/2020   BUN 13 11/28/2020   CREATININE 0.95 11/28/2020   BILITOT 0.3 11/28/2020   ALKPHOS 68 10/15/2019   AST 18 11/28/2020   ALT 22 11/28/2020   PROT 6.8 11/28/2020   ALBUMIN 3.7 10/15/2019   CALCIUM 9.3 11/28/2020   ANIONGAP 10 10/15/2019   Lab Results  Component Value Date   CHOL 155 07/18/2019   Lab Results  Component Value Date   HDL 63 07/18/2019   Lab Results  Component Value Date   LDLCALC 71 07/18/2019   Lab Results  Component Value Date   TRIG 124 07/18/2019   Lab Results  Component Value Date   CHOLHDL 2.5 07/18/2019   Lab Results  Component Value Date   HGBA1C 6.3 (H) 11/16/2018      Assessment & Plan:  Second pneumonia vaccine tolerated well, given in left deltoid without any apparent complications.  Problem List Items Addressed This Visit   None   Visit Diagnoses    Need for pneumococcal vaccination    -  Primary   Relevant Orders   Pneumococcal conjugate  vaccine 13-valent IM (Completed)      No orders of the defined types were placed in this encounter.   Follow-up: No follow-ups on file.    Ninfa Meeker, CMA

## 2021-04-03 NOTE — Progress Notes (Signed)
Normal mammogram. Follow up in 1 year.

## 2021-04-08 ENCOUNTER — Encounter: Payer: Self-pay | Admitting: Family Medicine

## 2021-04-14 ENCOUNTER — Encounter: Payer: Self-pay | Admitting: Family Medicine

## 2021-04-25 ENCOUNTER — Encounter: Payer: Self-pay | Admitting: Family Medicine

## 2021-04-29 ENCOUNTER — Ambulatory Visit: Payer: Medicare Other | Admitting: Family Medicine

## 2021-05-01 ENCOUNTER — Encounter: Payer: Self-pay | Admitting: Family Medicine

## 2021-05-01 ENCOUNTER — Ambulatory Visit (INDEPENDENT_AMBULATORY_CARE_PROVIDER_SITE_OTHER): Payer: Medicare Other | Admitting: Family Medicine

## 2021-05-01 ENCOUNTER — Other Ambulatory Visit: Payer: Self-pay

## 2021-05-01 VITALS — BP 136/69 | HR 51 | Temp 98.1°F | Resp 17 | Wt 217.3 lb

## 2021-05-01 DIAGNOSIS — H66002 Acute suppurative otitis media without spontaneous rupture of ear drum, left ear: Secondary | ICD-10-CM | POA: Diagnosis not present

## 2021-05-01 MED ORDER — DOXYCYCLINE HYCLATE 100 MG PO TABS: 100.0000 mg | ORAL_TABLET | Freq: Two times a day (BID) | ORAL | 0 refills | Status: AC

## 2021-05-01 NOTE — Progress Notes (Addendum)
Acute Office Visit  Subjective:    Patient ID: Yvonne Long, female    DOB: [birth year], 70 y.o.   MRN: [id number]  Chief Complaint  Patient presents with  . Ear Pain    HPI Patient is in today for 2 weeks of left ear pain. She reports she initially thought it was allergy related and tried to take her allergy medications, apply moist heat, tylenol, and debrox drops which seemed to help temporarily. A few days later she had a sharp pain in her ear that radiated to her left face/sinuses/jaw. She tried to take some tylenol and a hot shower which again helped temporarily. She reports the intense sharp pain has not occurred again, but she has continued to have 5/10 left ear pain with associated pressure radiating to left frontal and maxillary sinuses. She has not noticed any drainage from the ear. States she has had some dizziness/vertigo type symptoms for the past 2-3 days as the pressure has increased. She denies any hearing loss, vision changes, spinning sensation, nausea, vomiting, fevers, body aches.      Past Medical History:  Diagnosis Date  . Chronic kidney disease (CKD) stage G3b/A1, moderately decreased glomerular filtration rate (GFR) between 30-44 mL/min/1.73 square meter and albuminuria creatinine ratio less than 30 mg/g (HCC) 03/19/2016  . Depression   . Diverticulum   . Herpes simplex infection 07/17/2019   Oral and genital  . Hyperlipidemia 07/24/2008   Qualifier: Diagnosis of  By: Hulda Humphrey    . Left knee pain 11/20/2015  . Major depressive disorder, recurrent episode ([place]) 09/28/2006   Qualifier: Diagnosis of  By: Madilyn Fireman MD, Barnetta Chapel    . OSA (obstructive sleep apnea) 11/11/2018   Study 10/2018 IMPRESSIONS - Mild obstructive sleep apnea occurred during this study (AHI = 14.2/h). - No significant central sleep apnea occurred during this study (CAI = 0.0/h). - Oxygen desaturation was noted during this study (Min O2 = 85%). - Patient snored.  . Palpitations  07/05/2007   Qualifier: Diagnosis of  By: Madilyn Fireman MD, Barnetta Chapel    . Prediabetes 04/16/2015  . Primary localized osteoarthritis of left knee 11/02/2018  . Thoracic back pain 02/12/2016    Past Surgical History:  Procedure Laterality Date  . ABDOMINAL HYSTERECTOMY  2005   Dr. Toy Cookey  . BILATERAL SALPINGOOPHORECTOMY  2005   Dr. Noralee Stain  . CHOLECYSTECTOMY  2013  . CYSTOCELE REPAIR  2005   with bladder tack  . SPINE SURGERY  2/11   Lumbar Laminectomy-Dr. Owens Shark  . TOTAL KNEE ARTHROPLASTY Left 11/14/2018  . TOTAL KNEE ARTHROPLASTY Left 11/14/2018   Procedure: left TOTAL KNEE ARTHROPLASTY;  Surgeon: Elsie Saas, MD;  Location: [facility];  Service: Orthopedics;  Laterality: Left;    Family History  Problem Relation Age of Onset  . Alcohol abuse Father   . Depression Father   . Peripheral vascular disease Father   . Thrombosis Father        deceased from thrombosis of leg.  Marland Kitchen Heart attack Other   . Hypertension Mother   . Cancer Other        Brain cancer  . Stroke Other   . Depression Son     Social History   Socioeconomic History  . Marital status: Married    Spouse name: Darnell Level  . Number of children: 2  . Years of education: 16  . Highest education level: Bachelor's degree (e.g., BA, AB, BS)  Occupational History  . 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 Occupation: Freight forwarder    Comment: Part-time  Tobacco Use  . Smoking status: Former Smoker    Quit date: 12/21/1994    Years since quitting: 26.3  . Smokeless tobacco: Never Used  Vaping Use  . Vaping Use: Never used  Substance and Sexual Activity  . Alcohol use: Yes    Alcohol/week: 1.0 standard drink    Types: 1 Glasses of wine per week    Comment: 1 alcoholic drink per day  . Drug use: No  . Sexual activity: Not Currently    Birth control/protection: Post-menopausal  Other Topics Concern  . Not on file  Social History Narrative   1 caffeinated drink/day.   Enjoys yard work and yoga. Lives with her husband.    Social Determinants of Health   Financial Resource Strain: Low Risk   . Difficulty of Paying Living Expenses: Not hard at all  Food Insecurity: No Food Insecurity  . Worried About Charity fundraiser in the Last Year: Never true  . Ran Out of Food in the Last Year: Never true  Transportation Needs: No Transportation Needs  . Lack of Transportation (Medical): No  . Lack of Transportation (Non-Medical): No  Physical Activity: Inactive  . Days of Exercise per Week: 0 days  . Minutes of Exercise per Session: 0 min  Stress: No Stress Concern Present  . Feeling of Stress : Not at all  Social Connections: Moderately Integrated  . Frequency of Communication with Friends and Family: More than three times a week  . Frequency of Social Gatherings with Friends and Family: Twice a week  . Attends Religious Services: More than 4 times per year  . Active Member of Clubs or Organizations: No  . Attends Archivist Meetings: Never  . Marital Status: Married  Human resources officer Violence: Not At Risk  . Fear of Current or Ex-Partner: No  . Emotionally Abused: No  . Physically Abused: No  . Sexually Abused: No    Outpatient Medications Prior to Visit  Medication Sig Dispense Refill  . Cholecalciferol (VITAMIN D3) 25 MCG (1000 UT) CAPS Take 1,000 Int'l Units by mouth.    . Magnesium 200 MG TABS Take 200 mg by mouth daily.    . metoprolol succinate (TOPROL-XL) 25 MG 24 hr tablet Take 25 mg by mouth daily.    . metoprolol succinate (TOPROL XL) 25 MG 24 hr tablet Take 1 tablet (25 mg total) by mouth daily. 90 tablet 3  . AMBULATORY NON FORMULARY MEDICATION Medication Name: CPAP with nasal pillows and humidifier.  Set to AutoPap 4-20 cm watere pressure setting for 10 days and then please fax his download so that we can set her pressure.  New diagnosis of obstructive sleep apnea with AHI of 14.2.  Was faxed to [facility] 1 vial 0  .  atorvastatin (LIPITOR) 20 MG tablet TAKE 1 TABLET BY MOUTH EVERYDAY AT BEDTIME 90 tablet 3  . BIOTIN PO Take 1 tablet by mouth daily.    Marland Kitchen EC-81 ASPIRIN PO Take 1 tablet by mouth daily.    . Glucosamine HCl 1500 MG TABS Take 2 tablets by mouth daily.    Marland Kitchen PARoxetine (PAXIL) 30 MG tablet TAKE 1 TABLET BY MOUTH EVERY DAY 90 tablet 0  . verapamil (CALAN-SR) 180 MG CR tablet Take 1 tablet (180 mg total) by mouth at bedtime. (Patient taking differently: Take 120 mg by mouth at bedtime.) 90 tablet 3  .  amoxicillin-clavulanate (AUGMENTIN) 875-125 MG tablet Take 1 tablet by mouth 2 (two) times daily. (Patient not taking: Reported on 03/12/2021) 14 tablet 0  . Chlorpheniramine Maleate (ALLERGY RELIEF PO) Take 50 mg by mouth every 4 (four) hours as needed ([facility]). (Patient not taking: Reported on 03/12/2021)    . Cholecalciferol 25 MCG (1000 UT) capsule Take 1,000 Units by mouth daily.      No facility-administered medications prior to visit.    Allergies  Allergen Reactions  . Codeine Nausea Only and Other (See Comments)    On an empty stomach only    Review of Systems All review of systems negative except what is listed in the HPI     Objective:    Physical Exam Constitutional:      General: She is not in acute distress.    Appearance: Normal appearance. She is not ill-appearing.  HENT:     Head: Normocephalic and atraumatic.     Right Ear: Hearing, tympanic membrane, ear canal and external ear normal.     Left Ear: Hearing and ear canal normal. Tenderness present. A middle ear effusion is present. Tympanic membrane is erythematous and bulging.     Nose: Nose normal.     Mouth/Throat:     Mouth: Mucous membranes are moist.     Pharynx: Oropharynx is clear.  Cardiovascular:     Rate and Rhythm: Regular rhythm. Bradycardia present.     Heart sounds: Normal heart sounds.  Pulmonary:     Breath sounds: Normal breath sounds.  Musculoskeletal:     Cervical back: Normal range of motion  and neck supple. No tenderness.  Lymphadenopathy:     Cervical: No cervical adenopathy.  Skin:    General: Skin is warm and dry.     Capillary Refill: Capillary refill takes less than 2 seconds.     Findings: No erythema.  Neurological:     General: No focal deficit present.     Mental Status: She is alert and oriented to person, place, and time. Mental status is at baseline.     Cranial Nerves: No cranial nerve deficit.  Psychiatric:        Mood and Affect: Mood normal.        Behavior: Behavior normal.        Thought Content: Thought content normal.        Judgment: Judgment normal.     BP 136/69   Pulse (!) 51   Temp 98.1 F (36.7 C)   Resp 17   Wt 217 lb 4.8 oz (98.6 kg)   SpO2 94%   BMI 35.07 kg/m  Wt Readings from Last 3 Encounters:  05/01/21 217 lb 4.8 oz (98.6 kg)  04/02/21 210 lb (95.3 kg)  12/24/20 214 lb 1.9 oz (97.1 kg)    There are no preventive care reminders to display for this patient.  There are no preventive care reminders to display for this patient.   Lab Results  Component Value Date   TSH 0.93 11/28/2020   Lab Results  Component Value Date   WBC 8.3 04/18/2020   HGB 12.8 04/18/2020   HCT 38.6 04/18/2020   MCV 88.5 04/18/2020   PLT 245 04/18/2020   Lab Results  Component Value Date   NA 142 11/28/2020   K 4.1 11/28/2020   CO2 27 11/28/2020   GLUCOSE 108 (H) 11/28/2020   BUN 13 11/28/2020   CREATININE 0.95 11/28/2020   BILITOT 0.3 11/28/2020   ALKPHOS 68 10/15/2019  AST 18 11/28/2020   ALT 22 11/28/2020   PROT 6.8 11/28/2020   ALBUMIN 3.7 10/15/2019   CALCIUM 9.3 11/28/2020   ANIONGAP 10 10/15/2019   Lab Results  Component Value Date   CHOL 155 07/18/2019   Lab Results  Component Value Date   HDL 63 07/18/2019   Lab Results  Component Value Date   LDLCALC 71 07/18/2019   Lab Results  Component Value Date   TRIG 124 07/18/2019   Lab Results  Component Value Date   CHOLHDL 2.5 07/18/2019   Lab Results   Component Value Date   HGBA1C 6.3 (H) 11/16/2018       Assessment & Plan:   1. Non-recurrent acute suppurative otitis media of left ear without spontaneous rupture of tympanic membrane Left TM with purulence, bulging, erythema consistent with AOM. She was recently on Augmentin, so we will treat with doxycycline this time. Encourage her to continue supportive care including tylenol, allergy medication (Claritin, etc), Flonase, warm compresses, avoiding q-tips, rest, hydration, etc. I do believe the effusion/infection is likely the trigger of her pressure and her feeling off-balanced. Can reassess if not improving after AOM treated. Educated on signs and symptoms that would require further evaluation.  - doxycycline (VIBRA-TABS) 100 MG tablet; Take 1 tablet (100 mg total) by mouth 2 (two) times daily for 7 days.  Dispense: 14 tablet; Refill: 0  Follow-up if symptoms worsen or fail to improve.   Terrilyn Saver, NP    *05/06/21 Addendum - MyChart messaging with patient and she states that she has not noticed much improvement with the doxy. Will switch to cefdinir to see if this will cover it better. Follow-up if not starting to improve within the first 2-3 days of taking.

## 2021-05-01 NOTE — Patient Instructions (Signed)
Otitis Media, Adult  Otitis media occurs when there is inflammation and fluid in the middle ear space with signs and symptoms of an acute infection. The middle ear is a part of the ear that contains bones for hearing as well as air that helps send sounds to the brain. When infected fluid builds up in this space, it causes pressure and results in symptoms of acute otitis media. The eustachian tube connects the middle ear to the back of the nose (nasopharynx) and normally allows air into the middle ear space. If the eustachian tube becomes blocked, fluid can build up and become infected. What are the causes? This condition is caused by a blockage in the eustachian tube. This can be caused by an object like mucus, or by swelling (edema) of the tube. Problems that can cause a blockage include:  A cold or other upper respiratory infection.  Allergies.  An irritant, such as tobacco smoke.  Enlarged adenoids. The adenoids are areas of soft tissue located high in the back of the throat, behind the nose and the roof of the mouth. They are part of the body's defense system (immune system).  A mass in the nasopharynx.  Damage to the ear caused by pressure changes (barotrauma). What are the signs or symptoms? Symptoms of this condition include:  Ear pain.  Fever.  Decreased hearing.  Tiredness (lethargy).  Fluid leaking from the ear, if the eardrum is ruptured or has burst.  Ringing in the ear. How is this diagnosed? This condition is diagnosed with a physical exam. During the exam, your health care provider will use an instrument called an otoscope to look in your ear and check for redness, swelling, and fluid. He or she will also ask about your symptoms. Your health care provider may also order tests, such as:  A pneumatic otoscopy. This is a test to check the movement of the eardrum. It is done by squeezing a small amount of air into the ear.  A tympanogram is a test that shows how well  the eardrum moves in response to air pressure in the ear canal. It provides a graph for your health care provider to review.   How is this treated? This condition can go away on its own within 3-5 days. But if the condition is caused by a bacterial infection and does not go away on its own, or if it keeps coming back, your health care provider may:  Prescribe antibiotic medicine to treat the infection.  Prescribe or recommend medicines to control pain. Follow these instructions at home:  Take over-the-counter and prescription medicines only as told by your health care provider.  If you were prescribed an antibiotic medicine, take it as told by your health care provider. Do not stop taking the antibiotic even if you start to feel better.  Keep all follow-up visits as told by your health care provider. This is important. Contact a health care provider if:  You have bleeding from your nose.  There is a lump on your neck.  You are not feeling better in 5 days.  You feel worse instead of better. Get help right away if:  You have severe pain that is not controlled with medicine.  You have swelling, redness, or pain around your ear.  You have stiffness in your neck.  A part of your face is not moving (paralyzed).  The bone behind your ear (mastoid) is tender when you touch it.  You develop a severe headache. Summary  Otitis media is redness, soreness, and swelling of the middle ear, usually resulting in pain.  This condition can go away on its own within 3-5 days.  If the problem does not go away in 3-5 days, your health care provider may prescribe or recommend medicines to treat the infection or your symptoms.  If you were prescribed an antibiotic medicine, take it as told by your health care provider.  Follow all instructions you were given by your health care provider. This information is not intended to replace advice given to you by your health care provider. Make sure  you discuss any questions you have with your health care provider. Document Revised: 11/09/2019 Document Reviewed: 11/09/2019 Elsevier Patient Education  2021 [employer].

## 2021-05-05 ENCOUNTER — Encounter: Payer: Self-pay | Admitting: Family Medicine

## 2021-05-06 ENCOUNTER — Telehealth: Payer: Self-pay | Admitting: Family Medicine

## 2021-05-06 MED ORDER — CEFDINIR 300 MG PO CAPS: 300.0000 mg | ORAL_CAPSULE | Freq: Two times a day (BID) | ORAL | 0 refills | Status: DC

## 2021-05-06 NOTE — Addendum Note (Signed)
Addended by: Caleen Jobs B on: 05/06/2021 08:55 AM   Modules accepted: Orders

## 2021-05-06 NOTE — Chronic Care Management (AMB) (Signed)
  Chronic Care Management   Note  05/06/2021 Name: Shanisha Lech MRN: [id number] DOB: 1951-06-03  Arleth Mccullar is a 70 y.o. year old female who is a primary care patient of Metheney, Rene Kocher, MD. I reached out to Margie Ege by phone today in response to a referral sent by Ms. Levada Dy [place]'s PCP, Hali Marry, MD.   Ms. Barrick was given information about Chronic Care Management services today including:  1. CCM service includes personalized support from designated clinical staff supervised by her physician, including individualized plan of care and coordination with other care providers 2. 24/7 contact phone numbers for assistance for urgent and routine care needs. 3. Service will only be billed when office clinical staff spend 20 minutes or more in a month to coordinate care. 4. Only one practitioner may furnish and bill the service in a calendar month. 5. The patient may stop CCM services at any time (effective at the end of the month) by phone call to the office staff.   Patient agreed to services and verbal consent obtained.   Follow up plan:   Lauretta Grill [employer] Scheduler

## 2021-05-08 NOTE — Telephone Encounter (Signed)
Great!

## 2021-05-09 ENCOUNTER — Other Ambulatory Visit: Payer: Self-pay | Admitting: Family Medicine

## 2021-05-09 DIAGNOSIS — F330 Major depressive disorder, recurrent, mild: Secondary | ICD-10-CM

## 2021-05-09 MED ORDER — TIZANIDINE HCL 4 MG PO TABS: 4.0000 mg | ORAL_TABLET | Freq: Every evening | ORAL | 0 refills | Status: DC | PRN

## 2021-05-20 ENCOUNTER — Telehealth: Payer: Self-pay | Admitting: Pharmacist

## 2021-05-20 NOTE — Telephone Encounter (Signed)
Called patient to reschedule June 7 Initial CCM visit from onsite to telephone visit, but patient requested instead to cancel appointment for both her and her husband - states that medications are doing fine and does not desire CCM pharmacist services.  Patient to be unenrolled from CCM services at this time. Encouraged patient to ask PCP at future appointments if any further questions arise regarding CCM services.

## 2021-05-23 ENCOUNTER — Other Ambulatory Visit: Payer: Self-pay | Admitting: *Deleted

## 2021-05-23 DIAGNOSIS — G4733 Obstructive sleep apnea (adult) (pediatric): Secondary | ICD-10-CM

## 2021-05-23 MED ORDER — AMBULATORY NON FORMULARY MEDICATION: 0 refills | Status: DC

## 2021-05-27 ENCOUNTER — Ambulatory Visit: Payer: Medicare Other

## 2021-05-28 ENCOUNTER — Other Ambulatory Visit: Payer: Self-pay | Admitting: Family Medicine

## 2021-05-30 DIAGNOSIS — G4733 Obstructive sleep apnea (adult) (pediatric): Secondary | ICD-10-CM | POA: Diagnosis not present

## 2021-05-31 ENCOUNTER — Other Ambulatory Visit: Payer: Self-pay | Admitting: Family Medicine

## 2021-06-02 ENCOUNTER — Ambulatory Visit (INDEPENDENT_AMBULATORY_CARE_PROVIDER_SITE_OTHER): Payer: Medicare Other | Admitting: Physician Assistant

## 2021-06-02 ENCOUNTER — Other Ambulatory Visit: Payer: Self-pay

## 2021-06-02 VITALS — BP 158/62 | HR 66 | Temp 97.9°F | Ht 66.0 in | Wt 218.0 lb

## 2021-06-02 DIAGNOSIS — M549 Dorsalgia, unspecified: Secondary | ICD-10-CM

## 2021-06-02 DIAGNOSIS — R311 Benign essential microscopic hematuria: Secondary | ICD-10-CM | POA: Diagnosis not present

## 2021-06-02 DIAGNOSIS — M545 Low back pain, unspecified: Secondary | ICD-10-CM | POA: Diagnosis not present

## 2021-06-02 LAB — POCT URINALYSIS DIP (CLINITEK)
Bilirubin, UA: NEGATIVE
Glucose, UA: NEGATIVE mg/dL
Ketones, POC UA: NEGATIVE mg/dL
Leukocytes, UA: NEGATIVE
Nitrite, UA: NEGATIVE
POC PROTEIN,UA: NEGATIVE
Spec Grav, UA: 1.015 (ref 1.010–1.025)
Urobilinogen, UA: 0.2 E.U./dL
pH, UA: 5.5 (ref 5.0–8.0)

## 2021-06-02 MED ORDER — PHENAZOPYRIDINE HCL 200 MG PO TABS: 200.0000 mg | ORAL_TABLET | Freq: Three times a day (TID) | ORAL | 0 refills | Status: AC

## 2021-06-02 NOTE — Progress Notes (Signed)
Please culture.

## 2021-06-02 NOTE — Patient Instructions (Signed)
Drink plenty of water.  Take pyridium for 2 days.  Will call with culture.  You call us with new symptoms or worsening pain.

## 2021-06-02 NOTE — Progress Notes (Signed)
Subjective:    Patient ID: Yvonne Long, female    DOB: 05-24-1951, 70 y.o.   MRN: [id number]  HPI Patient is a 70 year old obese female who presents to the clinic with right mid back pain since Thursday.  She denies any fever,nausea, vomiting, abdominal pain, dysuria, increase in urinary frequency or urgency.  She did feel like she had some chills when this first started.  The pain has been more intermittent, dull, achy.  She has not had any pain today.  She started drinking lots of water and that seemed to resolve.  She denies any other musculoskeletal strain or injury. She has had a UTI before but has been a few years. Never had a kidney stone. Drinking coke/soda made the pain worse.   .. Active Ambulatory Problems    Diagnosis Date Noted   Hyperlipidemia 07/24/2008   Major depressive disorder, recurrent episode ([place]) 09/28/2006   PALPITATIONS 07/05/2007   Left knee pain 11/20/2015   Thoracic back pain 02/12/2016   Chronic kidney disease (CKD) stage G3b/A1, moderately decreased glomerular filtration rate (GFR) between 30-44 mL/min/1.73 square meter and albuminuria creatinine ratio less than 30 mg/g (HCC) 03/19/2016   Prediabetes 04/16/2015   Primary localized osteoarthritis of left knee 11/02/2018   OSA (obstructive sleep apnea) 11/11/2018   Herpes simplex infection 07/17/2019   Depression    Mid back pain on right side 06/02/2021   Benign essential microscopic hematuria 06/03/2021   Resolved Ambulatory Problems    Diagnosis Date Noted   UTI 11/05/2010   SINUSITIS - ACUTE-NOS 12/23/2010   Past Medical History:  Diagnosis Date   Diverticulum        Review of Systems See HPI.     Objective:   Physical Exam Vitals reviewed.  Constitutional:      Appearance: Normal appearance. She is obese.  HENT:     Head: Normocephalic.  Cardiovascular:     Rate and Rhythm: Normal rate and regular rhythm.     Pulses: Normal pulses.     Heart sounds: Normal heart sounds.   Pulmonary:     Effort: Pulmonary effort is normal.     Breath sounds: Normal breath sounds.  Abdominal:     Tenderness: There is no right CVA tenderness or left CVA tenderness.  Musculoskeletal:     Comments: Discomfort to palpation right mid back with tight paraspinal muscles.   Neurological:     General: No focal deficit present.     Mental Status: She is alert and oriented to person, place, and time.  Psychiatric:        Mood and Affect: Mood normal.      .. Results for orders placed or performed in visit on 06/02/21  POCT URINALYSIS DIP (CLINITEK)  Result Value Ref Range   Color, UA yellow yellow   Clarity, UA clear clear   Glucose, UA negative negative mg/dL   Bilirubin, UA negative negative   Ketones, POC UA negative negative mg/dL   Spec Grav, UA 1.015 1.010 - 1.025   Blood, UA trace-lysed (A) negative   pH, UA 5.5 5.0 - 8.0   POC PROTEIN,UA negative negative, trace   Urobilinogen, UA 0.2 0.2 or 1.0 E.U./dL   Nitrite, UA Negative Negative   Leukocytes, UA Negative Negative       Assessment & Plan:   Marland KitchenMarland KitchenAddilee was seen today for back pain.  Diagnoses and all orders for this visit:  Mid back pain on right side -     POCT URINALYSIS  DIP (CLINITEK) -     Urine Culture -     phenazopyridine (PYRIDIUM) 200 MG tablet; Take 1 tablet (200 mg total) by mouth 3 (three) times daily for 2 days.  Benign essential microscopic hematuria  UA trace blood with no protein, leuks, nitrites.  Pain resolved today.  Will culture to confirm no infection.  Started pyridium for 2 days.  Could have been kidney stone.  Drink plenty of fluids.  ? Musculoskeletal strain. Icy hot, stretches, heat, NSAID as needed.  Follow up urine in 2 weeks to see if hematuria resolved or sooner if pain worsens. Consider cmp and imaging if pain comes back.

## 2021-06-03 ENCOUNTER — Encounter: Payer: Self-pay | Admitting: Physician Assistant

## 2021-06-03 DIAGNOSIS — R311 Benign essential microscopic hematuria: Secondary | ICD-10-CM | POA: Insufficient documentation

## 2021-06-04 LAB — URINE CULTURE
MICRO NUMBER:: 12003084
SPECIMEN QUALITY:: ADEQUATE

## 2021-06-04 NOTE — Progress Notes (Signed)
How are you doing? Urine culture did not grow any substantial bacteria.

## 2021-06-23 ENCOUNTER — Other Ambulatory Visit: Payer: Self-pay | Admitting: Family Medicine

## 2021-06-24 ENCOUNTER — Other Ambulatory Visit: Payer: Self-pay | Admitting: Family Medicine

## 2021-06-28 ENCOUNTER — Encounter: Payer: Self-pay | Admitting: Family Medicine

## 2021-07-01 ENCOUNTER — Ambulatory Visit (INDEPENDENT_AMBULATORY_CARE_PROVIDER_SITE_OTHER): Payer: Medicare Other | Admitting: Family Medicine

## 2021-07-01 ENCOUNTER — Encounter: Payer: Self-pay | Admitting: Family Medicine

## 2021-07-01 ENCOUNTER — Other Ambulatory Visit: Payer: Self-pay

## 2021-07-01 VITALS — BP 130/45 | HR 47 | Ht 66.0 in | Wt 221.0 lb

## 2021-07-01 DIAGNOSIS — F439 Reaction to severe stress, unspecified: Secondary | ICD-10-CM

## 2021-07-01 DIAGNOSIS — R7303 Prediabetes: Secondary | ICD-10-CM

## 2021-07-01 DIAGNOSIS — F330 Major depressive disorder, recurrent, mild: Secondary | ICD-10-CM | POA: Diagnosis not present

## 2021-07-01 DIAGNOSIS — E785 Hyperlipidemia, unspecified: Secondary | ICD-10-CM | POA: Diagnosis not present

## 2021-07-01 DIAGNOSIS — N1832 Chronic kidney disease, stage 3b: Secondary | ICD-10-CM | POA: Diagnosis not present

## 2021-07-01 MED ORDER — PAROXETINE HCL 40 MG PO TABS: 40.0000 mg | ORAL_TABLET | Freq: Every day | ORAL | 0 refills | Status: DC

## 2021-07-01 NOTE — Progress Notes (Signed)
Acute Office Visit  Subjective:    Patient ID: Yvonne Long, female    DOB: [birth year], 70 y.o.   MRN: [id number]  Chief Complaint  Patient presents with   Depression    HPI Patient is in today for mood.  She says that she is concerned because the last couple of weeks she is just been extremely tearful but she is also been under a lot of stress.  Sleep is fair.  She has been on Paxil for probably over 2 decades and is wondering if is just no longer working well for her she currently takes 30 mg daily.  She is taking care of her elderly mother who is 26 and is very noncompliant and defiant and this has been stressful.  She says she and her husband have been getting into it a little bit as well and she is having problems with her son who is a drug addict.  Sometimes it just feels stressful and overwhelming.  Past Medical History:  Diagnosis Date   Chronic kidney disease (CKD) stage G3b/A1, moderately decreased glomerular filtration rate (GFR) between 30-44 mL/min/1.73 square meter and albuminuria creatinine ratio less than 30 mg/g (HCC) 03/19/2016   Depression    Diverticulum    Herpes simplex infection 07/17/2019   Oral and genital   Hyperlipidemia 07/24/2008   Qualifier: Diagnosis of  By: Edman Circle RN, Paula     Left knee pain 11/20/2015   Major depressive disorder, recurrent episode ([place]) 09/28/2006   Qualifier: Diagnosis of  By: Madilyn Fireman MD, Genelle Economou     OSA (obstructive sleep apnea) 11/11/2018   Study 10/2018 IMPRESSIONS - Mild obstructive sleep apnea occurred during this study (AHI = 14.2/h). - No significant central sleep apnea occurred during this study (CAI = 0.0/h). - Oxygen desaturation was noted during this study (Min O2 = 85%). - Patient snored.   Palpitations 07/05/2007   Qualifier: Diagnosis of  By: Madilyn Fireman MD, Nysa Sarin     Prediabetes 04/16/2015   Primary localized osteoarthritis of left knee 11/02/2018   Thoracic back pain 02/12/2016    Past Surgical History:   Procedure Laterality Date   ABDOMINAL HYSTERECTOMY  2005   Dr. Toy Cookey   BILATERAL SALPINGOOPHORECTOMY  2005   Dr. Noralee Stain   CHOLECYSTECTOMY  2013   [place]  2005   with bladder tack   SPINE SURGERY  2/11   Lumbar Laminectomy-Dr. Owens Shark   TOTAL KNEE ARTHROPLASTY Left 11/14/2018   TOTAL KNEE ARTHROPLASTY Left 11/14/2018   Procedure: left TOTAL KNEE ARTHROPLASTY;  Surgeon: Elsie Saas, MD;  Location: [facility];  Service: Orthopedics;  Laterality: Left;    Family History  Problem Relation Age of Onset   Alcohol abuse Father    Depression Father    Peripheral vascular disease Father    Thrombosis Father        deceased from thrombosis of leg.   Heart attack Other    Hypertension Mother    Cancer Other        Brain cancer   Stroke Other    Depression Son     Social History   Socioeconomic History   Marital status: Married    Spouse name: Darnell Level   Number of children: 2   Years of education: 16   Highest education level: Bachelor's degree (e.g., BA, AB, BS)  Occupational History   Occupation: PROGRAM MANAGER    Employer: [employer]: Retired   Occupation: Freight forwarder    Comment: Part-time  Tobacco Use   Smoking status: Former    Pack years: 0.00    Types: Cigarettes    Quit date: 12/21/1994    Years since quitting: 26.5   Smokeless tobacco: Never  Vaping Use   Vaping Use: Never used  Substance and Sexual Activity   Alcohol use: Yes    Alcohol/week: 1.0 standard drink    Types: 1 Glasses of wine per week    Comment: 1 alcoholic drink per day   Drug use: No   Sexual activity: Not Currently    Birth control/protection: Post-menopausal  Other Topics Concern   Not on file  Social History Narrative   1 caffeinated drink/day.  Enjoys yard work and yoga. Lives with her husband.    Social Determinants of Health   Financial Resource Strain: Low Risk    Difficulty of Paying Living Expenses: Not hard at all  Food Insecurity: No Food  Insecurity   Worried About Charity fundraiser in the Last Year: Never true   [place] in the Last Year: Never true  Transportation Needs: No Transportation Needs   Lack of Transportation (Medical): No   Lack of Transportation (Non-Medical): No  Physical Activity: Inactive   Days of Exercise per Week: 0 days   Minutes of Exercise per Session: 0 min  Stress: No Stress Concern Present   Feeling of Stress : Not at all  Social Connections: Moderately Integrated   Frequency of Communication with Friends and Family: More than three times a week   Frequency of Social Gatherings with Friends and Family: Twice a week   Attends Religious Services: More than 4 times per year   Active Member of [employer] or Organizations: No   Attends Music therapist: Never   Marital Status: Married  Human resources officer Violence: Not At Risk   Fear of Current or Ex-Partner: No   Emotionally Abused: No   Physically Abused: No   Sexually Abused: No    Outpatient Medications Prior to Visit  Medication Sig Dispense Refill   AMBULATORY NON FORMULARY MEDICATION Medication Name: CPAP with nasal pillows and humidifier.  Set to AutoPap 4-20 cm watere pressure setting for 10 days and then please fax his download so that we can set her pressure.  New diagnosis of obstructive sleep apnea with AHI of 14.2.  Was faxed to [facility] 1 vial 0   atorvastatin (LIPITOR) 20 MG tablet TAKE 1 TABLET (20 MG TOTAL) BY MOUTH DAILY. NEEDS APPOINTMENT AND LABS. 15 tablet 0   BIOTIN PO Take 1 tablet by mouth daily.     Cholecalciferol (VITAMIN D3) 25 MCG (1000 UT) CAPS Take 1,000 Int'l Units by mouth.     EC-81 ASPIRIN PO Take 1 tablet by mouth daily.     Glucosamine HCl 1500 MG TABS Take 2 tablets by mouth daily.     Magnesium 200 MG TABS Take 200 mg by mouth daily.     tiZANidine (ZANAFLEX) 4 MG tablet TAKE 1 TABLET BY MOUTH AT BEDTIME AS NEEDED FOR MUSCLE SPASMS. 30 tablet 0   verapamil (CALAN-SR) 180 MG CR tablet  Take 1 tablet (180 mg total) by mouth at bedtime. (Patient taking differently: Take 120 mg by mouth at bedtime.) 90 tablet 3   PARoxetine (PAXIL) 30 MG tablet TAKE 1 TABLET BY MOUTH EVERY DAY 90 tablet 0   metoprolol succinate (TOPROL-XL) 25 MG 24 hr tablet Take 25 mg by mouth daily.     AMBULATORY NON FORMULARY MEDICATION  Medication Name: CPAP supplies mask, tubing.  Dx: G90.33  Fax to [facility]: [phone] 1 vial 0   No facility-administered medications prior to visit.    Allergies  Allergen Reactions   Codeine Nausea Only and Other (See Comments)    On an empty stomach only    Review of Systems     Objective:    Physical Exam  BP (!) 130/45   Pulse (!) 47   Ht 5\' 6"  (1.676 m)   Wt 221 lb (100.2 kg)   SpO2 97%   BMI 35.67 kg/m  Wt Readings from Last 3 Encounters:  07/01/21 221 lb (100.2 kg)  06/02/21 218 lb (98.9 kg)  05/01/21 217 lb 4.8 oz (98.6 kg)    Health Maintenance Due  Topic Date Due   Zoster Vaccines- Shingrix (1 of 2) Never done   COVID-19 Vaccine (4 - Booster for Pfizer series) 04/01/2021    There are no preventive care reminders to display for this patient.   Lab Results  Component Value Date   TSH 0.93 11/28/2020   Lab Results  Component Value Date   WBC 8.3 04/18/2020   HGB 12.8 04/18/2020   HCT 38.6 04/18/2020   MCV 88.5 04/18/2020   PLT 245 04/18/2020   Lab Results  Component Value Date   NA 142 11/28/2020   K 4.1 11/28/2020   CO2 27 11/28/2020   GLUCOSE 108 (H) 11/28/2020   BUN 13 11/28/2020   CREATININE 0.95 11/28/2020   BILITOT 0.3 11/28/2020   ALKPHOS 68 10/15/2019   AST 18 11/28/2020   ALT 22 11/28/2020   PROT 6.8 11/28/2020   ALBUMIN 3.7 10/15/2019   CALCIUM 9.3 11/28/2020   ANIONGAP 10 10/15/2019   Lab Results  Component Value Date   CHOL 155 07/18/2019   Lab Results  Component Value Date   HDL 63 07/18/2019   Lab Results  Component Value Date   LDLCALC 71 07/18/2019   Lab Results  Component Value  Date   TRIG 124 07/18/2019   Lab Results  Component Value Date   CHOLHDL 2.5 07/18/2019   Lab Results  Component Value Date   HGBA1C 6.3 (H) 11/16/2018       Assessment & Plan:   Problem List Items Addressed This Visit       Genitourinary   Chronic kidney disease (CKD) stage G3b/A1, moderately decreased glomerular filtration rate (GFR) between 30-44 mL/min/1.73 square meter and albuminuria creatinine ratio less than 30 mg/g (HCC)    Due to recheck renal function.       Relevant Orders   COMPLETE METABOLIC PANEL WITH GFR   Lipid Panel w/reflex Direct LDL   HgB A1c     Other   Prediabetes   Relevant Orders   COMPLETE METABOLIC PANEL WITH GFR   Lipid Panel w/reflex Direct LDL   HgB A1c   Major depressive disorder, recurrent episode (HCC)    PHQ-9 score of 12 today and GAD-7 score of 18.  No thoughts of wanting to harm herself.  Discussed options including increasing her Paxil to 40 mg versus continuing the 30 mg dose and doing something out on such as Wellbutrin or considering changing the medication completely.  I think also talking with somebody could be helpful as well.  She wants to start by adjusting the Paxil.  Plan to follow-up in about 4 weeks so that we can see how she is doing       Relevant Medications   PARoxetine (PAXIL)  40 MG tablet   Hyperlipidemia - Primary   Relevant Orders   COMPLETE METABOLIC PANEL WITH GFR   Lipid Panel w/reflex Direct LDL   HgB A1c   Other Visit Diagnoses     Stress at home          Also overdue for labs today so we will go ahead and get those updated while she is here.  Meds ordered this encounter  Medications   PARoxetine (PAXIL) 40 MG tablet    Sig: Take 1 tablet (40 mg total) by mouth daily.    Dispense:  90 tablet    Refill:  0     Beatrice Lecher, MD

## 2021-07-01 NOTE — Assessment & Plan Note (Signed)
Due to recheck renal function. 

## 2021-07-01 NOTE — Progress Notes (Signed)
Acute Office Visit  Subjective:    Patient ID: Yvonne Long, female    DOB: 11/22/1951, 70 y.o.   MRN: [id number]  Chief Complaint  Patient presents with   Depression    HPI Patient is in today for Mood.    Past Medical History:  Diagnosis Date   Chronic kidney disease (CKD) stage G3b/A1, moderately decreased glomerular filtration rate (GFR) between 30-44 mL/min/1.73 square meter and albuminuria creatinine ratio less than 30 mg/g (HCC) 03/19/2016   Depression    Diverticulum    Herpes simplex infection 07/17/2019   Oral and genital   Hyperlipidemia 07/24/2008   Qualifier: Diagnosis of  By: Edman Circle RN, Paula     Left knee pain 11/20/2015   Major depressive disorder, recurrent episode ([place]) 09/28/2006   Qualifier: Diagnosis of  By: Madilyn Fireman MD, Aiden Rao     OSA (obstructive sleep apnea) 11/11/2018   Study 10/2018 IMPRESSIONS - Mild obstructive sleep apnea occurred during this study (AHI = 14.2/h). - No significant central sleep apnea occurred during this study (CAI = 0.0/h). - Oxygen desaturation was noted during this study (Min O2 = 85%). - Patient snored.   Palpitations 07/05/2007   Qualifier: Diagnosis of  By: Madilyn Fireman MD, Cyndie Woodbeck     Prediabetes 04/16/2015   Primary localized osteoarthritis of left knee 11/02/2018   Thoracic back pain 02/12/2016    Past Surgical History:  Procedure Laterality Date   ABDOMINAL HYSTERECTOMY  2005   Dr. Toy Cookey   BILATERAL SALPINGOOPHORECTOMY  2005   Dr. Noralee Stain   CHOLECYSTECTOMY  2013   [place]  2005   with bladder tack   SPINE SURGERY  2/11   Lumbar Laminectomy-Dr. Owens Shark   TOTAL KNEE ARTHROPLASTY Left 11/14/2018   TOTAL KNEE ARTHROPLASTY Left 11/14/2018   Procedure: left TOTAL KNEE ARTHROPLASTY;  Surgeon: Elsie Saas, MD;  Location: [facility];  Service: Orthopedics;  Laterality: Left;    Family History  Problem Relation Age of Onset   Alcohol abuse Father    Depression Father    Peripheral vascular disease  Father    Thrombosis Father        deceased from thrombosis of leg.   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: [employer]    Comment: Retired   Occupation: Freight forwarder    Comment: Part-time  Tobacco Use   Smoking status: Former    Pack years: 0.00    Types: Cigarettes    Quit date: 12/21/1994    Years since quitting: 26.5   Smokeless tobacco: Never  Vaping Use   Vaping Use: Never used  Substance and Sexual Activity   Alcohol use: Yes    Alcohol/week: 1.0 standard drink    Types: 1 Glasses of wine per week    Comment: 1 alcoholic drink per day   Drug use: No   Sexual activity: Not Currently    Birth control/protection: Post-menopausal  Other Topics Concern   Not on file  Social History Narrative   1 caffeinated drink/day.  Enjoys yard work and  yoga. Lives with her husband.    Social Determinants of Health   Financial Resource Strain: Low Risk    Difficulty of Paying Living Expenses: Not hard at all  Food Insecurity: No Food Insecurity   Worried About Charity fundraiser in the Last Year: Never true   [place] in the Last Year: Never true  Transportation Needs: No Transportation Needs   Lack of Transportation (Medical): No   Lack of Transportation (Non-Medical): No  Physical Activity: Inactive   Days of Exercise per Week: 0 days   Minutes of Exercise per Session: 0 min  Stress: No Stress Concern Present   Feeling of Stress : Not at all  Social Connections: Moderately Integrated   Frequency of Communication with Friends and Family: More than three times a week   Frequency of Social Gatherings with Friends and  Family: Twice a week   Attends Religious Services: More than 4 times per year   Active Member of [employer] or Organizations: No   Attends Music therapist: Never   Marital Status: Married  Human resources officer Violence: Not At Risk   Fear of Current or Ex-Partner: No   Emotionally Abused: No   Physically Abused: No   Sexually Abused: No    Outpatient Medications Prior to Visit  Medication Sig Dispense Refill   AMBULATORY NON FORMULARY MEDICATION Medication Name: CPAP with nasal pillows and humidifier.  Set to AutoPap 4-20 cm watere pressure setting for 10 days and then please fax his download so that we can set her pressure.  New diagnosis of obstructive sleep apnea with AHI of 14.2.  Was faxed to [facility] 1 vial 0   atorvastatin (LIPITOR) 20 MG tablet TAKE 1 TABLET (20 MG TOTAL) BY MOUTH DAILY. NEEDS APPOINTMENT AND LABS. 15 tablet 0   BIOTIN PO Take 1 tablet by mouth daily.     Cholecalciferol (VITAMIN D3) 25 MCG (1000 UT) CAPS Take 1,000 Int'l Units by mouth.     EC-81 ASPIRIN PO Take 1 tablet by mouth daily.     Glucosamine HCl 1500 MG TABS Take 2 tablets by mouth daily.     Magnesium 200 MG TABS Take 200 mg by mouth daily.     PARoxetine (PAXIL) 30 MG tablet TAKE 1 TABLET BY MOUTH EVERY DAY 90 tablet 0   tiZANidine (ZANAFLEX) 4 MG tablet TAKE 1 TABLET BY MOUTH AT BEDTIME AS NEEDED FOR MUSCLE SPASMS. 30 tablet 0   verapamil (CALAN-SR) 180 MG CR tablet Take 1 tablet (180 mg total) by mouth at bedtime. (Patient taking differently: Take 120 mg by mouth at bedtime.) 90 tablet 3   metoprolol succinate (TOPROL-XL) 25 MG 24 hr tablet Take 25 mg by mouth daily.     AMBULATORY NON FORMULARY MEDICATION Medication Name: CPAP supplies mask, tubing.  Dx: G63.33  Fax to [facility]: [phone] 1 vial 0   No facility-administered medications prior to visit.    Allergies  Allergen Reactions   Codeine Nausea Only and Other (See Comments)    On an empty stomach only    Review  of Systems     Objective:    Physical Exam  BP (!) 145/53   Pulse (!) 51   Ht 5\' 6"  (1.676 m)   Wt 221 lb (100.2 kg)   SpO2 97%   BMI 35.67 kg/m  Wt Readings from Last 3 Encounters:  07/01/21 221 lb (100.2 kg)  06/02/21 218 lb (98.9 kg)  05/01/21 217 lb  4.8 oz (98.6 kg)    Health Maintenance Due  Topic Date Due   Zoster Vaccines- Shingrix (1 of 2) Never done   COVID-19 Vaccine (4 - Booster for Pfizer series) 04/01/2021    There are no preventive care reminders to display for this patient.   Lab Results  Component Value Date   TSH 0.93 11/28/2020   Lab Results  Component Value Date   WBC 8.3 04/18/2020   HGB 12.8 04/18/2020   HCT 38.6 04/18/2020   MCV 88.5 04/18/2020   PLT 245 04/18/2020   Lab Results  Component Value Date   NA 142 11/28/2020   K 4.1 11/28/2020   CO2 27 11/28/2020   GLUCOSE 108 (H) 11/28/2020   BUN 13 11/28/2020   CREATININE 0.95 11/28/2020   BILITOT 0.3 11/28/2020   ALKPHOS 68 10/15/2019   AST 18 11/28/2020   ALT 22 11/28/2020   PROT 6.8 11/28/2020   ALBUMIN 3.7 10/15/2019   CALCIUM 9.3 11/28/2020   ANIONGAP 10 10/15/2019   Lab Results  Component Value Date   CHOL 155 07/18/2019   Lab Results  Component Value Date   HDL 63 07/18/2019   Lab Results  Component Value Date   LDLCALC 71 07/18/2019   Lab Results  Component Value Date   TRIG 124 07/18/2019   Lab Results  Component Value Date   CHOLHDL 2.5 07/18/2019   Lab Results  Component Value Date   HGBA1C 6.3 (H) 11/16/2018       Assessment & Plan:   Problem List Items Addressed This Visit   None   No orders of the defined types were placed in this encounter.    Beatrice Lecher, MD

## 2021-07-01 NOTE — Assessment & Plan Note (Signed)
PHQ-9 score of 12 today and GAD-7 score of 18.  No thoughts of wanting to harm herself.  Discussed options including increasing her Paxil to 40 mg versus continuing the 30 mg dose and doing something out on such as Wellbutrin or considering changing the medication completely.  I think also talking with somebody could be helpful as well.  She wants to start by adjusting the Paxil.  Plan to follow-up in about 4 weeks so that we can see how she is doing

## 2021-07-01 NOTE — Progress Notes (Signed)
Pt reports that she has been more "weepy". She doesn't feel as if the Paxil is effective as it once was.

## 2021-07-07 ENCOUNTER — Encounter: Payer: Self-pay | Admitting: Family Medicine

## 2021-07-07 DIAGNOSIS — N1832 Chronic kidney disease, stage 3b: Secondary | ICD-10-CM | POA: Diagnosis not present

## 2021-07-07 DIAGNOSIS — E785 Hyperlipidemia, unspecified: Secondary | ICD-10-CM | POA: Diagnosis not present

## 2021-07-07 DIAGNOSIS — R7303 Prediabetes: Secondary | ICD-10-CM | POA: Diagnosis not present

## 2021-07-08 LAB — COMPLETE METABOLIC PANEL WITH GFR
AG Ratio: 1.7 (calc) (ref 1.0–2.5)
ALT: 23 U/L (ref 6–29)
AST: 18 U/L (ref 10–35)
Albumin: 4.3 g/dL (ref 3.6–5.1)
Alkaline phosphatase (APISO): 65 U/L (ref 37–153)
BUN: 14 mg/dL (ref 7–25)
CO2: 26 mmol/L (ref 20–32)
Calcium: 9.2 mg/dL (ref 8.6–10.4)
Chloride: 106 mmol/L (ref 98–110)
Creat: 1 mg/dL (ref 0.50–1.05)
Globulin: 2.5 g/dL (calc) (ref 1.9–3.7)
Glucose, Bld: 138 mg/dL — ABNORMAL HIGH (ref 65–99)
Potassium: 4.5 mmol/L (ref 3.5–5.3)
Sodium: 140 mmol/L (ref 135–146)
Total Bilirubin: 0.7 mg/dL (ref 0.2–1.2)
Total Protein: 6.8 g/dL (ref 6.1–8.1)
eGFR: 61 mL/min/{1.73_m2} (ref >=60)

## 2021-07-08 LAB — HEMOGLOBIN A1C
Hgb A1c MFr Bld: 6.1 % of total Hgb — ABNORMAL HIGH (ref <5.7)
Mean Plasma Glucose: 128 mg/dL
eAG (mmol/L): 7.1 mmol/L

## 2021-07-08 LAB — LIPID PANEL W/REFLEX DIRECT LDL
Cholesterol: 153 mg/dL (ref <200)
HDL: 68 mg/dL (ref >=50)
LDL Cholesterol (Calc): 61 mg/dL (calc)
Non-HDL Cholesterol (Calc): 85 mg/dL (calc) (ref <130)
Total CHOL/HDL Ratio: 2.3 (calc) (ref <5.0)
Triglycerides: 154 mg/dL — ABNORMAL HIGH (ref <150)

## 2021-07-19 ENCOUNTER — Other Ambulatory Visit: Payer: Self-pay | Admitting: Family Medicine

## 2021-07-21 ENCOUNTER — Telehealth: Payer: Self-pay | Admitting: Family Medicine

## 2021-07-21 NOTE — Telephone Encounter (Signed)
Patient called. She states that she had her labs done. [facility] sent request for refill on her Atorivistatan. She is going out of town on Friday and wants refill before then.  Thank you.

## 2021-07-22 MED ORDER — ATORVASTATIN CALCIUM 20 MG PO TABS: 20.0000 mg | ORAL_TABLET | Freq: Every day | ORAL | 1 refills | Status: DC

## 2021-07-23 DIAGNOSIS — Z20822 Contact with and (suspected) exposure to covid-19: Secondary | ICD-10-CM | POA: Diagnosis not present

## 2021-07-23 NOTE — Telephone Encounter (Signed)
Refill has already been sent.

## 2021-07-24 ENCOUNTER — Other Ambulatory Visit: Payer: Self-pay | Admitting: Family Medicine

## 2021-08-05 ENCOUNTER — Ambulatory Visit (INDEPENDENT_AMBULATORY_CARE_PROVIDER_SITE_OTHER): Payer: Medicare Other | Admitting: Family Medicine

## 2021-08-05 ENCOUNTER — Encounter: Payer: Self-pay | Admitting: Family Medicine

## 2021-08-05 ENCOUNTER — Other Ambulatory Visit: Payer: Self-pay

## 2021-08-05 VITALS — BP 137/57 | Ht 66.0 in | Wt 220.0 lb

## 2021-08-05 DIAGNOSIS — F330 Major depressive disorder, recurrent, mild: Secondary | ICD-10-CM

## 2021-08-05 MED ORDER — PAROXETINE HCL 40 MG PO TABS: 40.0000 mg | ORAL_TABLET | Freq: Every day | ORAL | 0 refills | Status: DC

## 2021-08-05 NOTE — Progress Notes (Addendum)
Established Patient Office Visit  Subjective:  Patient ID: Yvonne Long, female    DOB: June 16, 1951  Age: 70 y.o. MRN: [id number]  CC:  Chief Complaint  Patient presents with  . Medication follow up     HPI Yvonne Long presents for   F/U Depression - she is interested in working with a therapist.  We increased her paroxetine and feels like she is actually doing really well she feels like she is much less tearful and weepy.  Still has a lot of stress with her husband, mom and son.  She has not had any negative side effects on the medication.  Past Medical History:  Diagnosis Date  . Chronic kidney disease (CKD) stage G3b/A1, moderately decreased glomerular filtration rate (GFR) between 30-44 mL/min/1.73 square meter and albuminuria creatinine ratio less than 30 mg/g (HCC) 03/19/2016  . Depression   . Diverticulum   . Herpes simplex infection 07/17/2019   Oral and genital  . Hyperlipidemia 07/24/2008   Qualifier: Diagnosis of  By: Hulda Humphrey    . Left knee pain 11/20/2015  . Major depressive disorder, recurrent episode ([place]) 09/28/2006   Qualifier: Diagnosis of  By: Madilyn Fireman MD, Barnetta Chapel    . OSA (obstructive sleep apnea) 11/11/2018   Study 10/2018 IMPRESSIONS - Mild obstructive sleep apnea occurred during this study (AHI = 14.2/h). - No significant central sleep apnea occurred during this study (CAI = 0.0/h). - Oxygen desaturation was noted during this study (Min O2 = 85%). - Patient snored.  . Palpitations 07/05/2007   Qualifier: Diagnosis of  By: Madilyn Fireman MD, Barnetta Chapel    . Prediabetes 04/16/2015  . Primary localized osteoarthritis of left knee 11/02/2018  . Thoracic back pain 02/12/2016    Past Surgical History:  Procedure Laterality Date  . ABDOMINAL HYSTERECTOMY  2005   Dr. Toy Cookey  . BILATERAL SALPINGOOPHORECTOMY  2005   Dr. Noralee Stain  . CHOLECYSTECTOMY  2013  . CYSTOCELE REPAIR  2005   with bladder tack  . SPINE SURGERY  2/11   Lumbar  Laminectomy-Dr. Owens Shark  . TOTAL KNEE ARTHROPLASTY Left 11/14/2018  . TOTAL KNEE ARTHROPLASTY Left 11/14/2018   Procedure: left TOTAL KNEE ARTHROPLASTY;  Surgeon: Elsie Saas, MD;  Location: [place];  Service: Orthopedics;  Laterality: Left;    Family History  Problem Relation Age of Onset  . Alcohol abuse Father   . Depression Father   . Peripheral vascular disease Father   . Thrombosis Father        deceased from thrombosis of leg.  Marland Kitchen Heart attack Other   . Hypertension Mother   . Cancer Other        Brain cancer  . Stroke Other   . Depression Son     Social History   Socioeconomic History  . Marital status: Married    Spouse name: Darnell Level  . Number of children: 2  . Years of education: 16  . Highest education level: Bachelor's degree (e.g., BA, AB, BS)  Occupational History  . 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 Occupation: Freight forwarder    Comment: Part-time  Tobacco Use  . Smoking status: Former    Types: Cigarettes    Quit date: 12/21/1994    Years since quitting: 26.6  . Smokeless tobacco: Never  Vaping Use  . Vaping Use: Never used  Substance and Sexual Activity  . Alcohol use: Yes    Alcohol/week: 1.0 standard drink  Types: 1 Glasses of wine per week    Comment: 1 alcoholic drink per day  . Drug use: No  . Sexual activity: Not Currently    Birth control/protection: Post-menopausal  Other Topics Concern  . Not on file  Social History Narrative   1 caffeinated drink/day.  Enjoys yard work and yoga. Lives with her husband.    Social Determinants of Health   Financial Resource Strain: Low Risk   . Difficulty of Paying Living Expenses: Not hard at all  Food Insecurity: No Food Insecurity  . Worried About Charity fundraiser in the Last Year: Never true  . Ran Out of Food in the Last Year: Never true  Transportation Needs: No Transportation Needs  . Lack of Transportation (Medical): No  . Lack of  Transportation (Non-Medical): No  Physical Activity: Inactive  . Days of Exercise per Week: 0 days  . Minutes of Exercise per Session: 0 min  Stress: No Stress Concern Present  . Feeling of Stress : Not at all  Social Connections: Moderately Integrated  . Frequency of Communication with Friends and Family: More than three times a week  . Frequency of Social Gatherings with Friends and Family: Twice a week  . Attends Religious Services: More than 4 times per year  . Active Member of Clubs or Organizations: No  . Attends Archivist Meetings: Never  . Marital Status: Married  Human resources officer Violence: Not At Risk  . Fear of Current or Ex-Partner: No  . Emotionally Abused: No  . Physically Abused: No  . Sexually Abused: No    Outpatient Medications Prior to Visit  Medication Sig Dispense Refill  . AMBULATORY NON FORMULARY MEDICATION Medication Name: CPAP with nasal pillows and humidifier.  Set to AutoPap 4-20 cm watere pressure setting for 10 days and then please fax his download so that we can set her pressure.  New diagnosis of obstructive sleep apnea with AHI of 14.2.  Was faxed to [facility] 1 vial 0  . atorvastatin (LIPITOR) 20 MG tablet Take 1 tablet (20 mg total) by mouth daily. Needs appointment and labs. 90 tablet 1  . BIOTIN PO Take 1 tablet by mouth daily.    . Cholecalciferol (VITAMIN D3) 25 MCG (1000 UT) CAPS Take 1,000 Int'l Units by mouth.    . EC-81 ASPIRIN PO Take 1 tablet by mouth daily.    . Glucosamine HCl 1500 MG TABS Take 2 tablets by mouth daily.    . Magnesium 200 MG TABS Take 200 mg by mouth daily.    . metoprolol succinate (TOPROL-XL) 25 MG 24 hr tablet Take 25 mg by mouth daily.    Marland Kitchen tiZANidine (ZANAFLEX) 4 MG tablet TAKE 1 TABLET BY MOUTH AT BEDTIME AS NEEDED FOR MUSCLE SPASMS. 30 tablet 0  . verapamil (CALAN-SR) 180 MG CR tablet Take 1 tablet (180 mg total) by mouth at bedtime. (Patient taking differently: Take 120 mg by mouth at bedtime.) 90  tablet 3  . PARoxetine (PAXIL) 40 MG tablet Take 1 tablet (40 mg total) by mouth daily. 90 tablet 0   No facility-administered medications prior to visit.    Allergies  Allergen Reactions  . Codeine Nausea Only and Other (See Comments)    On an empty stomach only    ROS Review of Systems    Objective:    Physical Exam Constitutional:      Appearance: Normal appearance. She is well-developed.  HENT:     Head: Normocephalic and  atraumatic.  Cardiovascular:     Rate and Rhythm: Normal rate and regular rhythm.     Heart sounds: Normal heart sounds.  Pulmonary:     Effort: Pulmonary effort is normal.     Breath sounds: Normal breath sounds.  Skin:    General: Skin is warm and dry.  Neurological:     Mental Status: She is alert and oriented to person, place, and time.  Psychiatric:        Behavior: Behavior normal.    BP (!) 137/57 (BP Location: Right Arm, Patient Position: Sitting, Cuff Size: Large)   Ht $R'5\' 6"'YH$  (1.676 m)   Wt 220 lb (99.8 kg)   BMI 35.51 kg/m  Wt Readings from Last 3 Encounters:  08/05/21 220 lb (99.8 kg)  07/01/21 221 lb (100.2 kg)  06/02/21 218 lb (98.9 kg)     Health Maintenance Due  Topic Date Due  . Zoster Vaccines- Shingrix (1 of 2) Never done  . COVID-19 Vaccine (4 - Booster for Pfizer series) 04/01/2021  . INFLUENZA VACCINE  07/21/2021    There are no preventive care reminders to display for this patient.  Lab Results  Component Value Date   TSH 0.93 11/28/2020   Lab Results  Component Value Date   WBC 8.3 04/18/2020   HGB 12.8 04/18/2020   HCT 38.6 04/18/2020   MCV 88.5 04/18/2020   PLT 245 04/18/2020   Lab Results  Component Value Date   NA 140 07/07/2021   K 4.5 07/07/2021   CO2 26 07/07/2021   GLUCOSE 138 (H) 07/07/2021   BUN 14 07/07/2021   CREATININE 1.00 07/07/2021   BILITOT 0.7 07/07/2021   ALKPHOS 68 10/15/2019   AST 18 07/07/2021   ALT 23 07/07/2021   PROT 6.8 07/07/2021   ALBUMIN 3.7 10/15/2019   CALCIUM  9.2 07/07/2021   ANIONGAP 10 10/15/2019   EGFR 61 07/07/2021   Lab Results  Component Value Date   CHOL 153 07/07/2021   Lab Results  Component Value Date   HDL 68 07/07/2021   Lab Results  Component Value Date   LDLCALC 61 07/07/2021   Lab Results  Component Value Date   TRIG 154 (H) 07/07/2021   Lab Results  Component Value Date   CHOLHDL 2.3 07/07/2021   Lab Results  Component Value Date   HGBA1C 6.1 (H) 07/07/2021      Assessment & Plan:   Problem List Items Addressed This Visit       Other   Major depressive disorder, recurrent episode ([place]) - Primary    She is doing much better on the paroxetine 40 we will continue with current regimen for now went ahead and refilled enough to through the fall.  Plan will be to follow-up in 6 months we can always taper back sooner if she would like.  Will place referral for therapy/counseling.      Relevant Medications   PARoxetine (PAXIL) 40 MG tablet   Other Relevant Orders   Ambulatory referral to [facility] ordered this encounter  Medications  . PARoxetine (PAXIL) 40 MG tablet    Sig: Take 1 tablet (40 mg total) by mouth daily.    Dispense:  90 tablet    Refill:  0    Follow-up: Return in about 6 months (around 02/05/2022) for BP and Mood.    Beatrice Lecher, MD

## 2021-08-05 NOTE — Assessment & Plan Note (Addendum)
She is doing much better on the paroxetine 40 we will continue with current regimen for now went ahead and refilled enough to through the fall.  Plan will be to follow-up in 6 months we can always taper back sooner if she would like.  Will place referral for therapy/counseling.

## 2021-08-06 NOTE — Addendum Note (Signed)
Addended by: Beatrice Lecher D on: 08/06/2021 12:02 PM   Modules accepted: Orders

## 2021-08-15 ENCOUNTER — Other Ambulatory Visit: Payer: Self-pay | Admitting: Family Medicine

## 2021-08-15 DIAGNOSIS — F330 Major depressive disorder, recurrent, mild: Secondary | ICD-10-CM

## 2021-08-21 ENCOUNTER — Other Ambulatory Visit: Payer: Self-pay | Admitting: Family Medicine

## 2021-08-22 IMAGING — MG MM DIGITAL SCREENING BILAT W/ TOMO AND CAD
8 series · 8 of 24 positions shown · non-contrast
Comparison: Previous exam(s).

CLINICAL DATA: Screening.

EXAM:
DIGITAL SCREENING BILATERAL MAMMOGRAM WITH TOMOSYNTHESIS AND CAD
TECHNIQUE: Bilateral screening digital craniocaudal and mediolateral oblique
mammograms were obtained. Bilateral screening digital breast
tomosynthesis was performed. The images were evaluated with
computer-aided detection.

[L MLO synth-2D]
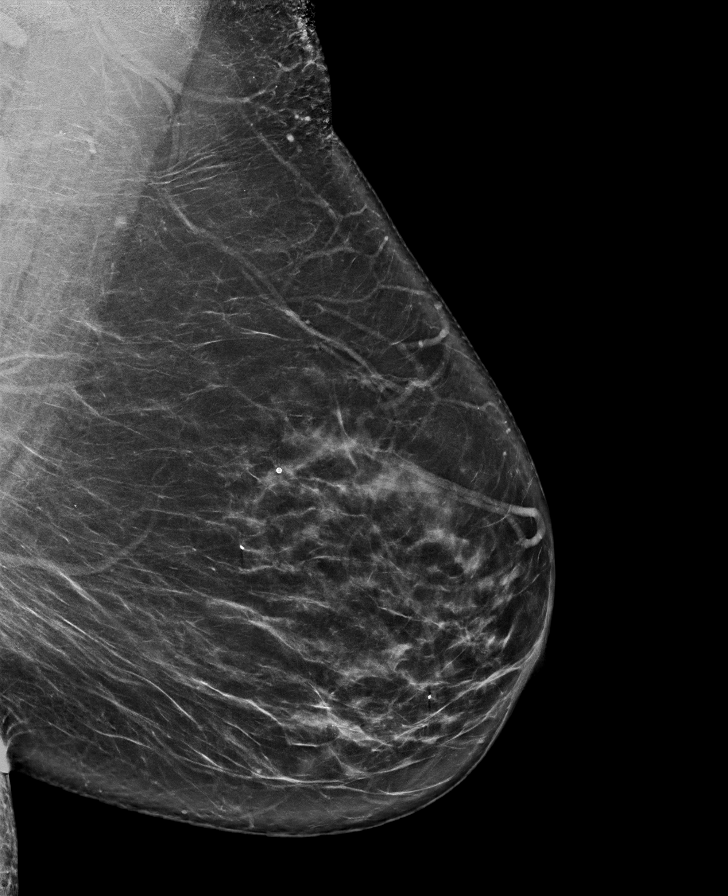

[R MLO synth-2D]
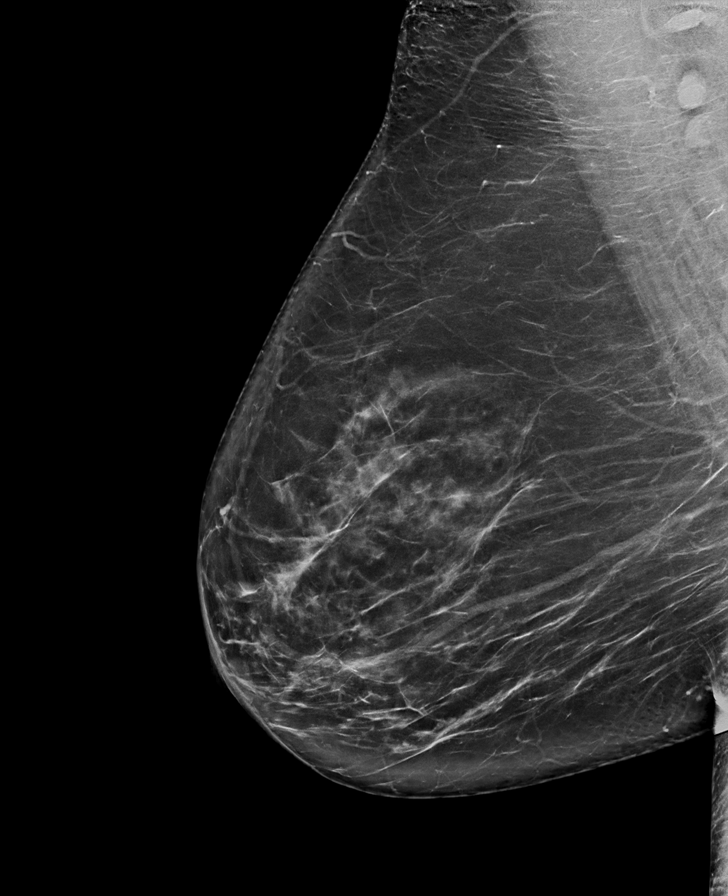

[R CC synth-2D]
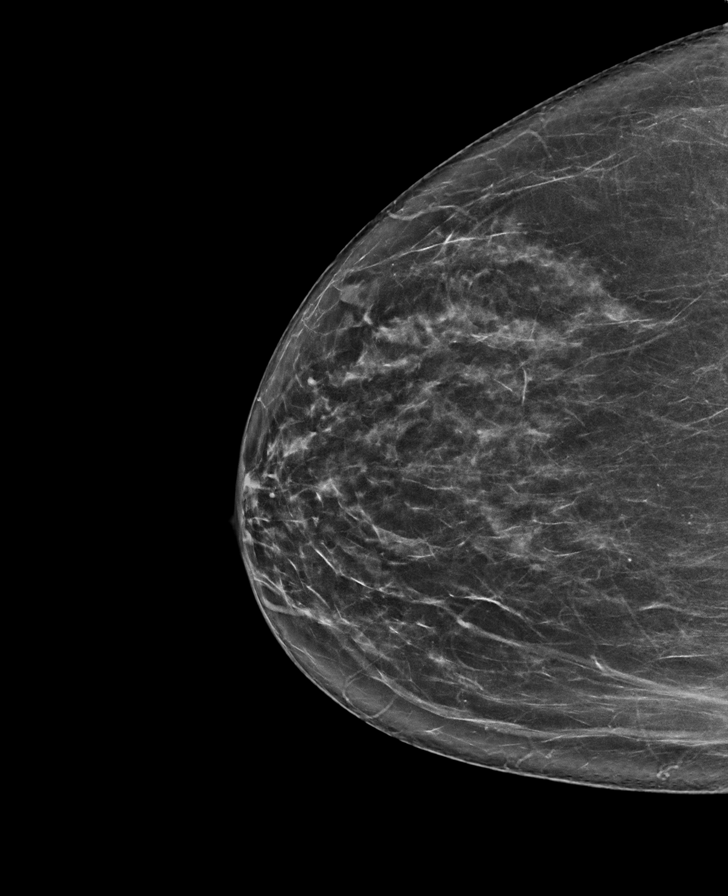

[L CC synth-2D]
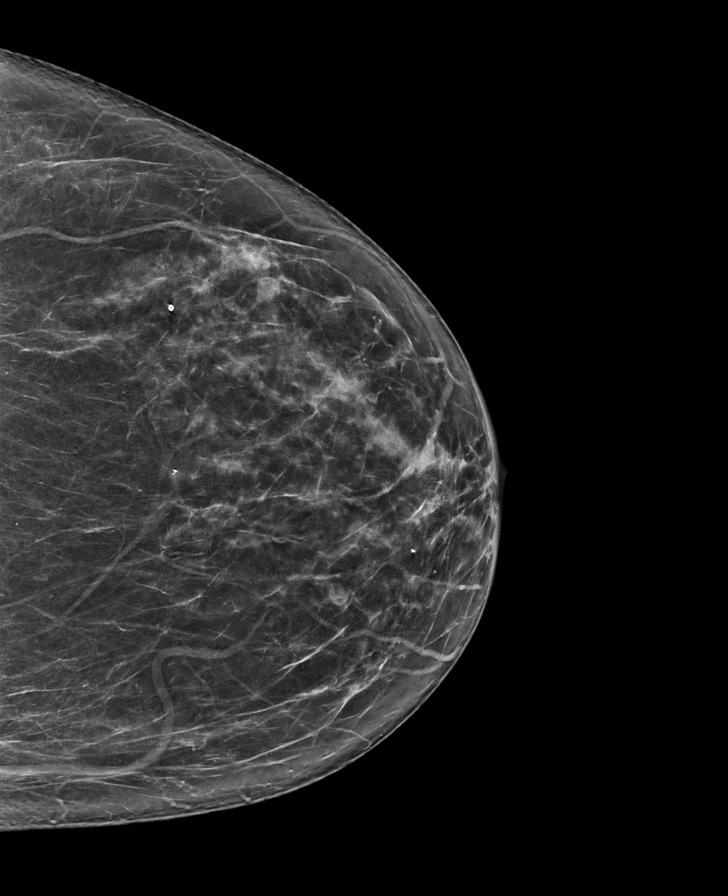

[R CC tomo · tomo slice 37/74.0]
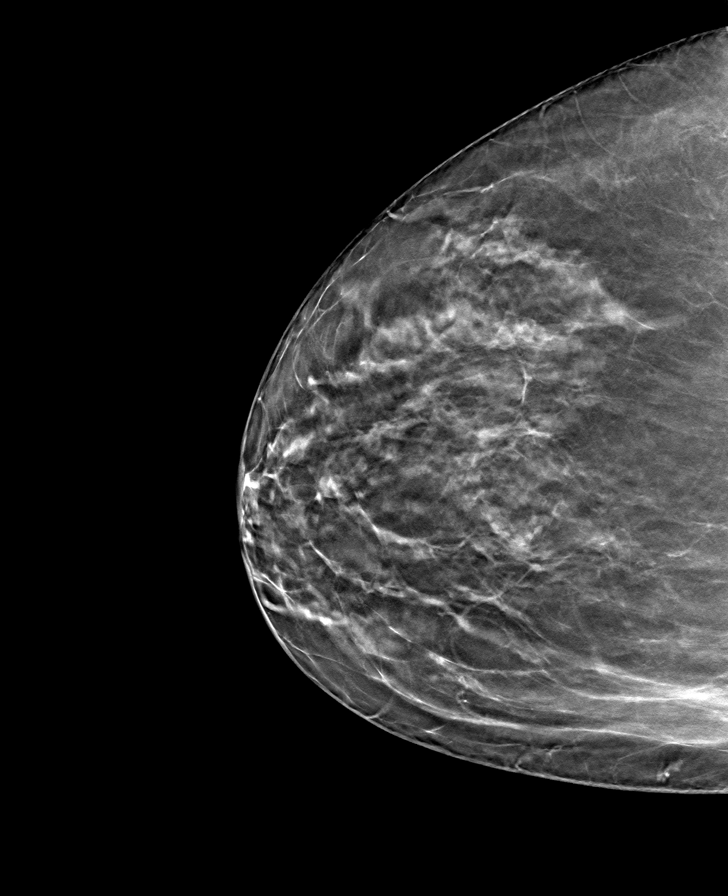

[R MLO tomo · tomo slice 45/89.0]
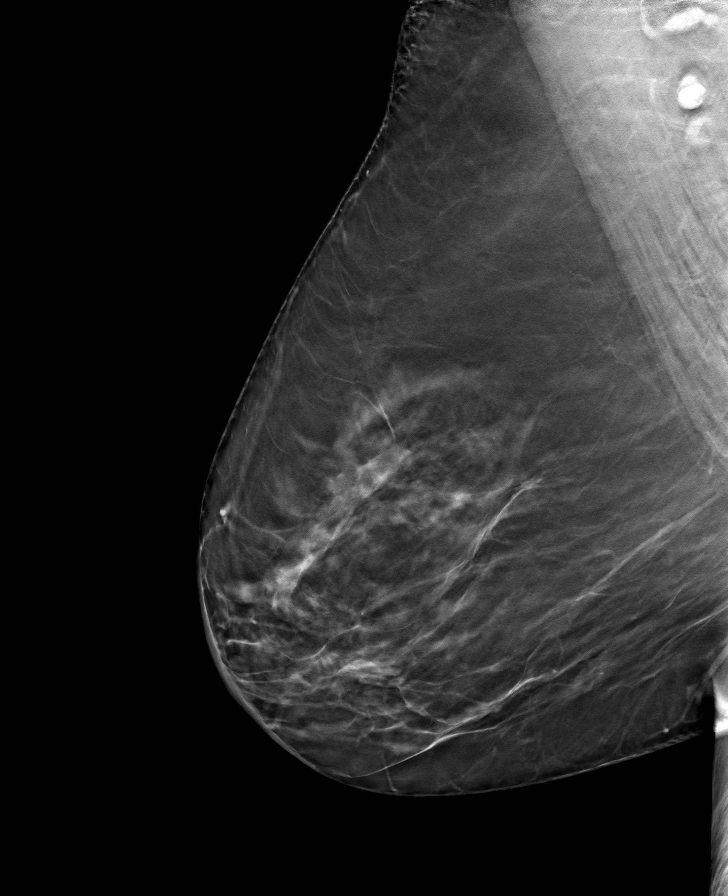

[L CC tomo · tomo slice 39/76.0]
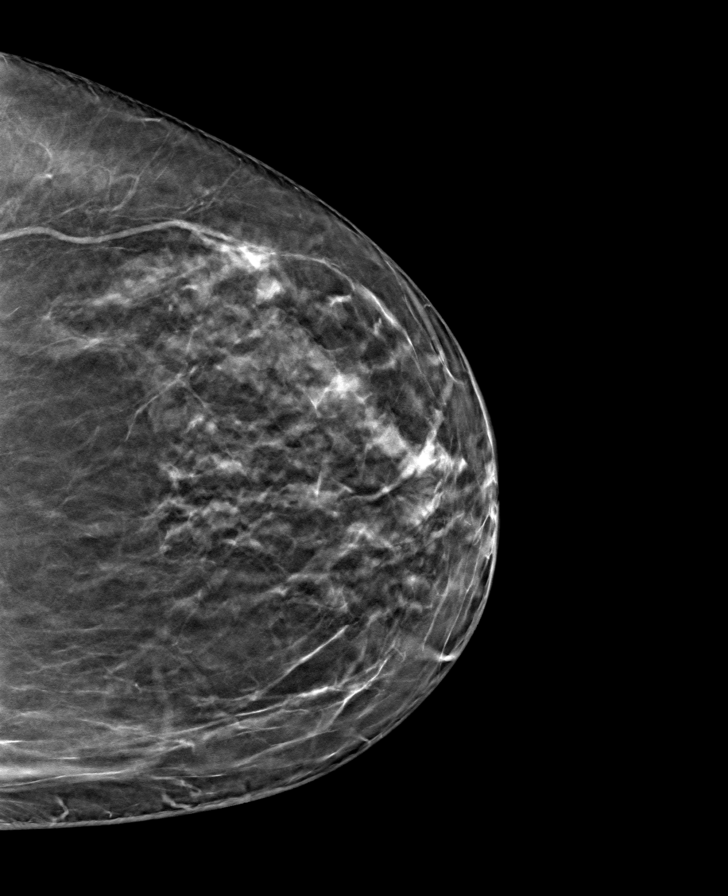

[L MLO tomo · tomo slice 45/90.0]
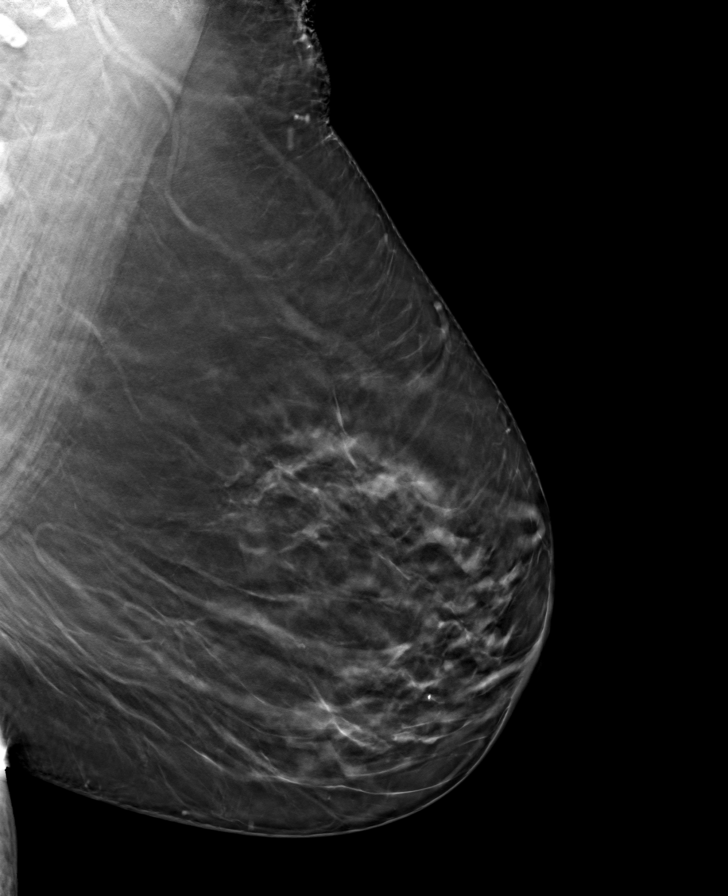

[8 of 24 positions shown; findings below may reference images not displayed]

ACR Breast Density Category b: There are scattered areas of
fibroglandular density.
FINDINGS: There are no findings suspicious for malignancy. The images were
evaluated with computer-aided detection.
IMPRESSION: No mammographic evidence of malignancy. A result letter of this
screening mammogram will be mailed directly to the patient.

RECOMMENDATION:
Screening mammogram in one year. (Code:[id number]-[id number])

BI-RADS CATEGORY  1: Negative.

## 2021-09-05 DIAGNOSIS — G4733 Obstructive sleep apnea (adult) (pediatric): Secondary | ICD-10-CM | POA: Diagnosis not present

## 2021-09-17 ENCOUNTER — Ambulatory Visit: Payer: Medicare Other

## 2021-09-29 ENCOUNTER — Ambulatory Visit (INDEPENDENT_AMBULATORY_CARE_PROVIDER_SITE_OTHER): Payer: Medicare Other | Admitting: Family Medicine

## 2021-09-29 ENCOUNTER — Other Ambulatory Visit: Payer: Self-pay

## 2021-09-29 DIAGNOSIS — S46012A Strain of muscle(s) and tendon(s) of the rotator cuff of left shoulder, initial encounter: Secondary | ICD-10-CM | POA: Diagnosis not present

## 2021-09-29 DIAGNOSIS — Z23 Encounter for immunization: Secondary | ICD-10-CM

## 2021-10-26 ENCOUNTER — Other Ambulatory Visit: Payer: Self-pay | Admitting: Family Medicine

## 2021-10-26 DIAGNOSIS — F330 Major depressive disorder, recurrent, mild: Secondary | ICD-10-CM

## 2021-10-27 NOTE — Telephone Encounter (Signed)
Pt is on 40mg  QD, not  30mg  QD

## 2021-10-28 ENCOUNTER — Ambulatory Visit: Payer: Medicare Other | Admitting: Family Medicine

## 2021-10-28 ENCOUNTER — Other Ambulatory Visit: Payer: Self-pay

## 2021-10-28 DIAGNOSIS — Z23 Encounter for immunization: Secondary | ICD-10-CM

## 2021-10-28 NOTE — Progress Notes (Signed)
Pt here for Shingrix vaccine #2.  Screening questionnaire reviewed, VIS provided to patient, and any/all patient questions answered.  Charyl Bigger, CMA

## 2021-10-28 NOTE — Progress Notes (Signed)
Agree with documentation as above.   Tiernan Suto, MD  

## 2021-11-20 ENCOUNTER — Encounter: Payer: Self-pay | Admitting: Family Medicine

## 2021-12-18 ENCOUNTER — Other Ambulatory Visit: Payer: Self-pay | Admitting: Family Medicine

## 2021-12-18 DIAGNOSIS — F330 Major depressive disorder, recurrent, mild: Secondary | ICD-10-CM

## 2022-01-12 ENCOUNTER — Other Ambulatory Visit: Payer: Self-pay | Admitting: Family Medicine

## 2022-01-12 DIAGNOSIS — M545 Low back pain, unspecified: Secondary | ICD-10-CM | POA: Diagnosis not present

## 2022-01-12 DIAGNOSIS — F330 Major depressive disorder, recurrent, mild: Secondary | ICD-10-CM

## 2022-01-28 DIAGNOSIS — M545 Low back pain, unspecified: Secondary | ICD-10-CM | POA: Diagnosis not present

## 2022-01-30 DIAGNOSIS — M545 Low back pain, unspecified: Secondary | ICD-10-CM | POA: Diagnosis not present

## 2022-02-04 ENCOUNTER — Other Ambulatory Visit: Payer: Self-pay | Admitting: Family Medicine

## 2022-02-04 DIAGNOSIS — F330 Major depressive disorder, recurrent, mild: Secondary | ICD-10-CM

## 2022-02-04 DIAGNOSIS — M545 Low back pain, unspecified: Secondary | ICD-10-CM | POA: Diagnosis not present

## 2022-02-10 DIAGNOSIS — M5416 Radiculopathy, lumbar region: Secondary | ICD-10-CM | POA: Diagnosis not present

## 2022-02-12 ENCOUNTER — Other Ambulatory Visit: Payer: Self-pay | Admitting: Family Medicine

## 2022-02-12 DIAGNOSIS — F330 Major depressive disorder, recurrent, mild: Secondary | ICD-10-CM

## 2022-02-26 DIAGNOSIS — M5416 Radiculopathy, lumbar region: Secondary | ICD-10-CM | POA: Diagnosis not present

## 2022-03-04 ENCOUNTER — Other Ambulatory Visit: Payer: Self-pay | Admitting: Family Medicine

## 2022-03-04 ENCOUNTER — Encounter: Payer: Self-pay | Admitting: Family Medicine

## 2022-03-04 DIAGNOSIS — H2513 Age-related nuclear cataract, bilateral: Secondary | ICD-10-CM | POA: Diagnosis not present

## 2022-03-07 ENCOUNTER — Encounter: Payer: Self-pay | Admitting: Family Medicine

## 2022-03-12 DIAGNOSIS — R3129 Other microscopic hematuria: Secondary | ICD-10-CM | POA: Diagnosis not present

## 2022-03-12 DIAGNOSIS — R3915 Urgency of urination: Secondary | ICD-10-CM | POA: Diagnosis not present

## 2022-03-16 ENCOUNTER — Ambulatory Visit (INDEPENDENT_AMBULATORY_CARE_PROVIDER_SITE_OTHER): Payer: Medicare Other | Admitting: Family Medicine

## 2022-03-16 DIAGNOSIS — Z0000 Encounter for general adult medical examination without abnormal findings: Secondary | ICD-10-CM

## 2022-03-16 DIAGNOSIS — Z1231 Encounter for screening mammogram for malignant neoplasm of breast: Secondary | ICD-10-CM | POA: Diagnosis not present

## 2022-03-16 NOTE — Progress Notes (Signed)
? ? ?MEDICARE ANNUAL WELLNESS VISIT ? ?03/16/2022 ? ?Telephone Visit Disclaimer ?This Medicare AWV was conducted by telephone due to national recommendations for restrictions regarding the COVID-19 Pandemic (e.g. social distancing).  I verified, using two identifiers, that I am speaking with Yvonne Long or their authorized healthcare agent. I discussed the limitations, risks, security, and privacy concerns of performing an evaluation and management service by telephone and the potential availability of an in-person appointment in the future. The patient expressed understanding and agreed to proceed.  ?Location of Patient: Home ?Location of Provider (nurse):  In the office. ? ?Subjective:  ? ? ?Yvonne Long is a 71 y.o. female patient of Metheney, Rene Kocher, MD who had a Medicare Annual Wellness Visit today via telephone. [name] is Retired and lives with their spouse. she has 2 children. she reports that she is socially active and does interact with friends/family regularly. she is minimally physically active and enjoys doing yoga and yardwork. ? ?Patient Care Team: ?Hali Marry, MD as PCP - General ? ? ?  03/16/2022  ?  9:57 AM 03/12/2021  ? 10:00 AM 10/15/2019  ? 11:53 AM 11/14/2018  ?  2:10 PM 11/04/2018  ? 11:27 AM  ?Advanced Directives  ?Does Patient Have a Medical Advance Directive? Yes Yes No Yes No  ?Type of Advance Directive Living will [place];Living will  [place];Living will   ?Does patient want to make changes to medical advance directive? No - Patient declined No - Patient declined  No - Patient declined   ?Copy of [place] in Chart?  No - copy requested  Yes - validated most recent copy scanned in chart (See row information)   ?Would patient like information on creating a medical advance directive?   No - Patient declined No - Patient declined Yes (MAU/Ambulatory/Procedural Areas - Information given)   ? ? ?Hospital Utilization Over the Past 12 Months: ?# of hospitalizations or ER visits: 0 ?# of surgeries: 0 ? ?Review of Systems    ?Patient reports that her overall health is worse compared to last year. ? ?History obtained from chart review and the patient ? ?Patient Reported Readings (BP, Pulse, CBG, Weight, etc) ?none ? ?Pain Assessment ?Pain : No/denies pain ? ?  ? ?Current Medications & Allergies (verified) ?Allergies as of 03/16/2022   ? ?   Reactions  ? Codeine Nausea Only, Other (See Comments)  ? On an empty stomach only  ? ?  ? ?  ?Medication List  ?  ? ?  ? Accurate as of March 16, 2022 10:12 AM. If you have any questions, ask your nurse or doctor.  ?  ?  ? ?  ? ?AMBULATORY NON FORMULARY MEDICATION ?Medication Name: CPAP with nasal pillows and humidifier.  Set to AutoPap 4-20 cm watere pressure setting for 10 days and then please fax his download so that we can set her pressure.  New diagnosis of obstructive sleep apnea with AHI of 14.2.  Was faxed to [facility] ?  ?atorvastatin 20 MG tablet ?Commonly known as: LIPITOR ?TAKE 1 TABLET (20 MG TOTAL) BY MOUTH DAILY. NEEDS APPOINTMENT AND LABS. ?  ?BIOTIN PO ?Take 1 tablet by mouth daily. ?  ?Biotin 1 MG Caps ?Biotin ?  ?EC-81 ASPIRIN PO ?Take 1 tablet by mouth daily. ?What changed: Another medication with the same name was removed. Continue taking this medication, and follow the directions you see here. ?  ?Glucosamine HCl 1500 MG Tabs ?  Take 2 tablets by mouth daily. ?  ?Magnesium 200 MG Tabs ?Take 200 mg by mouth daily. ?  ?metoprolol succinate 25 MG 24 hr tablet ?Commonly known as: TOPROL-XL ?Take 25 mg by mouth daily. ?  ?PARoxetine 40 MG tablet ?Commonly known as: PAXIL ?TAKE 1 TABLET BY MOUTH EVERY DAY ?  ?verapamil 180 MG CR tablet ?Commonly known as: CALAN-SR ?TAKE 1 TABLET BY MOUTH AT BEDTIME. ?  ?Vitamin D3 25 MCG (1000 UT) Caps ?Take 1,000 Int'l Units by mouth. ?  ? ?  ? ? ?History (reviewed): ?Past Medical History:  ?Diagnosis Date  ? Allergy    ? Pollen, dust  ? Anxiety   ? Perform exercise to control  ? Cataract   ? Will probably have them removed soon.  ? Chronic kidney disease (CKD) stage G3b/A1, moderately decreased glomerular filtration rate (GFR) between 30-44 mL/min/1.73 square meter and albuminuria creatinine ratio less than 30 mg/g (HCC) 03/19/2016  ? Depression   ? Diverticulum   ? Herpes simplex infection 07/17/2019  ? Oral and genital  ? Hyperlipidemia 07/24/2008  ? Qualifier: Diagnosis of  By: Hulda Humphrey    ? Hypertension   ? Take meds to control  ? Left knee pain 11/20/2015  ? Major depressive disorder, recurrent episode ([place]) 09/28/2006  ? Qualifier: Diagnosis of  By: Madilyn Fireman MD, Barnetta Chapel    ? OSA (obstructive sleep apnea) 11/11/2018  ? Study 10/2018 IMPRESSIONS - Mild obstructive sleep apnea occurred during this study (AHI = 14.2/h). - No significant central sleep apnea occurred during this study (CAI = 0.0/h). - Oxygen desaturation was noted during this study (Min O2 = 85%). - Patient snored.  ? Palpitations 07/05/2007  ? Qualifier: Diagnosis of  By: Madilyn Fireman MD, Barnetta Chapel    ? Prediabetes 04/16/2015  ? Primary localized osteoarthritis of left knee 11/02/2018  ? Sleep apnea   ? Use CPAP  ? Thoracic back pain 02/12/2016  ? ?Past Surgical History:  ?Procedure Laterality Date  ? ABDOMINAL HYSTERECTOMY  2005  ? Dr. Toy Cookey  ? BILATERAL SALPINGOOPHORECTOMY  2005  ? Dr. Noralee Stain  ? CHOLECYSTECTOMY  2013  ? CYSTOCELE REPAIR  2005  ? with bladder tack  ? JOINT REPLACEMENT  2021  ? Left knee  ? SPINE SURGERY  01/2010  ? Lumbar Laminectomy-Dr. Owens Shark  ? TOTAL KNEE ARTHROPLASTY Left 11/14/2018  ? TOTAL KNEE ARTHROPLASTY Left 11/14/2018  ? Procedure: left TOTAL KNEE ARTHROPLASTY;  Surgeon: Elsie Saas, MD;  Location: [place];  Service: Orthopedics;  Laterality: Left;  ? ?Family History  ?Problem Relation Age of Onset  ? Alcohol abuse Father   ? Depression Father   ? Peripheral vascular disease Father   ? Thrombosis Father   ?      deceased from thrombosis of leg.  ? Anxiety disorder Father   ? Heart attack Other   ? Hypertension Mother   ? Hearing loss Mother   ? Cancer Other   ?     Brain cancer  ? Stroke Other   ? Depression Son   ? Anxiety disorder Son   ? Drug abuse Son   ? Heart disease Maternal Grandfather   ? Arthritis Maternal Grandmother   ? Cancer Maternal Grandmother   ? Heart disease Paternal Grandfather   ? Arthritis Paternal Grandmother   ? Anxiety disorder Sister   ? Depression Sister   ? Obesity Sister   ? Depression Sister   ? Obesity Sister   ? ?Social History  ? ?Socioeconomic  History  ? Marital status: Married  ?  Spouse name: Darnell Level  ? Number of children: 2  ? Years of education: 67  ? Highest education level: Bachelor's degree (e.g., BA, AB, BS)  ?Occupational History  ? Occupation: PROGRAM MANAGER  ?  Employer: [employer]  ?  Comment: Retired  ? 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 Smoking status: Former  ?  Packs/day: 1.00  ?  Years: 15.00  ?  Pack years: 15.00  ?  Types: Cigarettes  ?  Quit date: 12/21/1994  ?  Years since quitting: 27.2  ? Smokeless tobacco: Never  ?Vaping Use  ? Vaping Use: Never used  ?Substance and Sexual Activity  ? Alcohol use: Yes  ?  Alcohol/week: 7.0 standard drinks  ?  Types: 7 Glasses of wine per week  ?  Comment: 1 alcoholic drink per day  ? Drug use: No  ? Sexual activity: Not Currently  ?  Birth control/protection: Post-menopausal  ?Other Topics Concern  ? Not on file  ?Social History Narrative  ? Lives with her husband. Enjoys yard work and yoga.   ? ?Social Determinants of Health  ? ?Financial Resource Strain: Low Risk   ? Difficulty of Paying Living Expenses: Not hard at all  ?Food Insecurity: No Food Insecurity  ? Worried About Charity fundraiser in the Last Year: Never true  ? Ran Out of Food in the Last Year: Never true  ?Transportation Needs: No Transportation Needs  ? Lack of Transportation (Medical): No  ? Lack of Transportation (Non-Medical):  No  ?Physical Activity: Insufficiently Active  ? Days of Exercise per Week: 2 days  ? Minutes of Exercise per Session: 30 min  ?Stress: No Stress Concern Present  ? Feeling of Stress : Not at all  ?Social Connections: [place]

## 2022-03-16 NOTE — Patient Instructions (Addendum)
?MEDICARE ANNUAL WELLNESS VISIT ?Health Maintenance Summary and Written Plan of Care ? ?Yvonne Long , ? ?Thank you for allowing me to perform your Medicare Annual Wellness Visit and for your ongoing commitment to your health.  ? ?Health Maintenance & Immunization History ?Health Maintenance  ?Topic Date Due  ?? COVID-19 Vaccine (4 - Booster for Pfizer series) 04/01/2022 (Originally 01/26/2021)  ?? MAMMOGRAM  04/02/2022  ?? Fecal DNA (Cologuard)  06/29/2022  ?? TETANUS/TDAP  01/26/2029  ?? Pneumonia Vaccine 41+ Years old  Completed  ?? INFLUENZA VACCINE  Completed  ?? DEXA SCAN  Completed  ?? Hepatitis C Screening  Completed  ?? Zoster Vaccines- Shingrix  Completed  ?? HPV VACCINES  Aged Out  ? ?Immunization History  ?Administered Date(s) Administered  ?? Fluad Quad(high Dose 65+) 10/03/2019, 10/02/2020, 09/29/2021  ?? Influenza Split 10/15/2014  ?? Influenza Whole 10/04/2007, 09/11/2008, 11/25/2009, 09/22/2010  ?? Influenza, High Dose Seasonal PF 10/13/2017, 09/15/2018  ?? Influenza-Unspecified 09/20/2013, 09/24/2013  ?? PFIZER(Purple Top)SARS-COV-2 Vaccination 03/02/2020, 03/26/2020, 12/01/2020  ?? Pneumococcal Conjugate-13 04/02/2021  ?? Pneumococcal Polysaccharide-23 01/26/2019  ?? Tdap 12/04/2007, 01/26/2019  ?? Zoster Recombinat (Shingrix) 08/16/2021, 10/28/2021  ?? Zoster, Live 07/17/2015  ? ? ?These are the patient goals that we discussed: ? Goals Addressed   ?  ?  ?  ?  ?  ? This Visit's Progress  ? ?  Patient Stated (pt-stated)     ?   Patient would like to loose 50 lbs. ?  ?  ?  ? ?This is a list of Health Maintenance Items that are overdue or due now: ?Mammogram- Due in April  ?Cologuard- Due in July ? ?Orders/Referrals Placed Today: ?Orders Placed This Encounter  ?Procedures  ?? Mammogram 3D SCREEN BREAST BILATERAL  ?  Standing Status:   Future  ?  Standing Expiration Date:   03/17/2023  ?  Scheduling Instructions:  ?   Please call patient to schedule.  ?  Order Specific Question:   Reason for Exam  (SYMPTOM  OR DIAGNOSIS REQUIRED)  ?  Answer:   breast cancer screening  ?  Order Specific Question:   Preferred imaging location?  ?  Answer:   [facility]  ? ? ?(Contact our referral department at [phone] if you have not spoken with someone about your referral appointment within the next 5 days)  ? ? ?Follow-up Plan ?Follow-up with Hali Marry, MD as planned ?Referral for your mammogram has been placed and they will call you to schedule. ?Medicare wellness visit in one year. ?Patient will access AVS on my chart. ? ? ? ?  ?Health Maintenance, Female ?Adopting a healthy lifestyle and getting preventive care are important in promoting health and wellness. Ask your health care provider about: ?The right schedule for you to have regular tests and exams. ?Things you can do on your own to prevent diseases and keep yourself healthy. ?What should I know about diet, weight, and exercise? ?Eat a healthy diet ? ?Eat a diet that includes plenty of vegetables, fruits, low-fat dairy products, and lean protein. ?Do not eat a lot of foods that are high in solid fats, added sugars, or sodium. ?Maintain a healthy weight ?Body mass index (BMI) is used to identify weight problems. It estimates body fat based on height and weight. Your health care provider can help determine your BMI and help you achieve or maintain a healthy weight. ?Get regular exercise ?Get regular exercise. This is one of the most important things you can do for your  health. Most adults should: ?Exercise for at least 150 minutes each week. The exercise should increase your heart rate and make you sweat (moderate-intensity exercise). ?Do strengthening exercises at least twice a week. This is in addition to the moderate-intensity exercise. ?Spend less time sitting. Even light physical activity can be beneficial. ?Watch cholesterol and blood lipids ?Have your blood tested for lipids and cholesterol at 70 years of age, then have this test every  5 years. ?Have your cholesterol levels checked more often if: ?Your lipid or cholesterol levels are high. ?You are older than 70 years of age. ?You are at high risk for heart disease. ?What should I know about cancer screening? ?Depending on your health history and family history, you may need to have cancer screening at various ages. This may include screening for: ?Breast cancer. ?Cervical cancer. ?Colorectal cancer. ?Skin cancer. ?Lung cancer. ?What should I know about heart disease, diabetes, and high blood pressure? ?Blood pressure and heart disease ?High blood pressure causes heart disease and increases the risk of stroke. This is more likely to develop in people who have high blood pressure readings or are overweight. ?Have your blood pressure checked: ?Every 3-5 years if you are 60-64 years of age. ?Every year if you are 37 years old or older. ?Diabetes ?Have regular diabetes screenings. This checks your fasting blood sugar level. Have the screening done: ?Once every three years after age 61 if you are at a normal weight and have a low risk for diabetes. ?More often and at a younger age if you are overweight or have a high risk for diabetes. ?What should I know about preventing infection? ?Hepatitis B ?If you have a higher risk for hepatitis B, you should be screened for this virus. Talk with your health care provider to find out if you are at risk for hepatitis B infection. ?Hepatitis C ?Testing is recommended for: ?Everyone born from [birth year] through 1964. ?Anyone with known risk factors for hepatitis C. ?Sexually transmitted infections (STIs) ?Get screened for STIs, including gonorrhea and chlamydia, if: ?You are sexually active and are younger than 70 years of age. ?You are older than 71 years of age and your health care provider tells you that you are at risk for this type of infection. ?Your sexual activity has changed since you were last screened, and you are at increased risk for chlamydia or gonorrhea.  Ask your health care provider if you are at risk. ?Ask your health care provider about whether you are at high risk for HIV. Your health care provider may recommend a prescription medicine to help prevent HIV infection. If you choose to take medicine to prevent HIV, you should first get tested for HIV. You should then be tested every 3 months for as long as you are taking the medicine. ?Pregnancy ?If you are about to stop having your period (premenopausal) and you may become pregnant, seek counseling before you get pregnant. ?Take 400 to 800 micrograms (mcg) of folic acid every day if you become pregnant. ?Ask for birth control (contraception) if you want to prevent pregnancy. ?Osteoporosis and menopause ?Osteoporosis is a disease in which the bones lose minerals and strength with aging. This can result in bone fractures. If you are 26 years old or older, or if you are at risk for osteoporosis and fractures, ask your health care provider if you should: ?Be screened for bone loss. ?Take a calcium or vitamin D supplement to lower your risk of fractures. ?Be given hormone replacement therapy (  HRT) to treat symptoms of menopause. ?Follow these instructions at home: ?Alcohol use ?Do not drink alcohol if: ?Your health care provider tells you not to drink. ?You are pregnant, may be pregnant, or are planning to become pregnant. ?If you drink alcohol: ?Limit how much you have to: ?0-1 drink a day. ?Know how much alcohol is in your drink. In the [place] one drink equals one 12 oz bottle of beer (355 mL), one 5 oz glass of wine (148 mL), or one 1? oz glass of hard liquor (44 mL). ?Lifestyle ?Do not use any products that contain nicotine or tobacco. These products include cigarettes, chewing tobacco, and vaping devices, such as e-cigarettes. If you need help quitting, ask your health care provider. ?Do not use street drugs. ?Do not share needles. ?Ask your health care provider for help if you need support or information about  quitting drugs. ?General instructions ?Schedule regular health, dental, and eye exams. ?Stay current with your vaccines. ?Tell your health care provider if: ?You often feel depressed. ?You have ever bee

## 2022-03-28 ENCOUNTER — Other Ambulatory Visit: Payer: Self-pay | Admitting: Family Medicine

## 2022-04-09 ENCOUNTER — Ambulatory Visit: Payer: Medicare Other | Admitting: Family Medicine

## 2022-04-10 DIAGNOSIS — U071 COVID-19: Secondary | ICD-10-CM

## 2022-04-10 HISTORY — DX: COVID-19: U07.1

## 2022-04-14 ENCOUNTER — Encounter: Payer: Self-pay | Admitting: Family Medicine

## 2022-04-14 ENCOUNTER — Telehealth (INDEPENDENT_AMBULATORY_CARE_PROVIDER_SITE_OTHER): Payer: Medicare Other | Admitting: Family Medicine

## 2022-04-14 DIAGNOSIS — U071 COVID-19: Secondary | ICD-10-CM

## 2022-04-14 NOTE — Progress Notes (Signed)
? ? ?  Virtual Visit via Video Note ? ?I connected with Yvonne Long on 04/14/22 at  4:20 PM EDT by a video enabled telemedicine application and verified that I am speaking with the correct person using two identifiers. ?  ?I discussed the limitations of evaluation and management by telemedicine and the availability of in person appointments. The patient expressed understanding and agreed to proceed. ? ?Patient location: at home ?Provider location: in office ? ?Subjective:   ? ?CC:   ?Chief Complaint  ?Patient presents with  ? Covid Positive  ? ? ?HPI: ? ?Pt reports she tested positive on Friday after returning from her cruise.  ?She c/o head congestion and coughing up phlegm. Some loose stool today;.  She had some fever and chills but that is actually better.  She has been using Tylenol and Coricidin and feels like she is finally turned the corner a little bit today.  But she is concerned about her husband who is also been sick. ?  ?She states that she is starting to feel some better ? ?Past medical history, Surgical history, Family history not pertinant except as noted below, Social history, Allergies, and medications have been entered into the medical record, reviewed, and corrections made.  ? ? ?Objective:   ? ?General: Speaking clearly in complete sentences without any shortness of breath.  Alert and oriented x3.  Normal judgment. No apparent acute distress. ? ? ? ?Impression and Recommendations:   ? ?Problem List Items Addressed This Visit   ?None ?Visit Diagnoses   ? ? COVID-19    -  Primary  ? ?  ? ?COVID-19-discussed symptomatic care.  At this point she is actually starting to feel better so organ to hold off on treating with antivirals.  Fortunately she is actually pretty low risk which is very reassuring call if not continuing to improve over the next week. ? ? ?No orders of the defined types were placed in this encounter. ? ? ?No orders of the defined types were placed in this encounter. ? ? ? ?I  discussed the assessment and treatment plan with the patient. The patient was provided an opportunity to ask questions and all were answered. The patient agreed with the plan and demonstrated an understanding of the instructions. ? ?I spent 10 minutes on the day of the encounter to include pre-visit record review, face-to-face time with the patient and post visit ordering of test. ? ?  ?The patient was advised to call back or seek an in-person evaluation if the symptoms worsen or if the condition fails to improve as anticipated. ? ? ?Beatrice Lecher, MD  ? ?

## 2022-04-14 NOTE — Progress Notes (Signed)
Pt reports she tested positive on Friday after returning from her cruise.  ? ?She c/o head congestion and coughing up phlegm.  ? ?She states that she is starting to feel some better ?

## 2022-04-14 NOTE — Telephone Encounter (Signed)
Can double book her at the end of the day for a virtual. ?

## 2022-04-14 NOTE — Telephone Encounter (Signed)
Scheduled

## 2022-04-16 ENCOUNTER — Telehealth: Payer: Medicare Other | Admitting: Family Medicine

## 2022-04-22 ENCOUNTER — Other Ambulatory Visit: Payer: Self-pay | Admitting: Family Medicine

## 2022-04-29 DIAGNOSIS — I700 Atherosclerosis of aorta: Secondary | ICD-10-CM | POA: Diagnosis not present

## 2022-04-29 DIAGNOSIS — R3129 Other microscopic hematuria: Secondary | ICD-10-CM | POA: Diagnosis not present

## 2022-04-29 DIAGNOSIS — K5790 Diverticulosis of intestine, part unspecified, without perforation or abscess without bleeding: Secondary | ICD-10-CM | POA: Diagnosis not present

## 2022-04-29 DIAGNOSIS — Z9049 Acquired absence of other specified parts of digestive tract: Secondary | ICD-10-CM | POA: Diagnosis not present

## 2022-04-29 DIAGNOSIS — N281 Cyst of kidney, acquired: Secondary | ICD-10-CM | POA: Diagnosis not present

## 2022-05-06 ENCOUNTER — Encounter: Payer: Self-pay | Admitting: Family Medicine

## 2022-05-06 DIAGNOSIS — Z1211 Encounter for screening for malignant neoplasm of colon: Secondary | ICD-10-CM

## 2022-05-13 ENCOUNTER — Other Ambulatory Visit: Payer: Self-pay | Admitting: Family Medicine

## 2022-05-18 ENCOUNTER — Other Ambulatory Visit: Payer: Self-pay | Admitting: Family Medicine

## 2022-05-19 NOTE — Telephone Encounter (Signed)
Patient has been scheduled for 06/18/22. AMUCK

## 2022-05-19 NOTE — Telephone Encounter (Signed)
Please call pt to schedule appt.  No further refills until pt is seen.  T. Britteney Ayotte, CMA  

## 2022-05-26 ENCOUNTER — Other Ambulatory Visit: Payer: Self-pay | Admitting: *Deleted

## 2022-05-26 MED ORDER — ATORVASTATIN CALCIUM 20 MG PO TABS
20.0000 mg | ORAL_TABLET | Freq: Every day | ORAL | 0 refills | Status: DC
Start: 2022-05-26 — End: 2022-07-30

## 2022-05-26 MED ORDER — VERAPAMIL HCL ER 180 MG PO TBCR: 180.0000 mg | EXTENDED_RELEASE_TABLET | Freq: Every day | ORAL | 0 refills | Status: DC

## 2022-05-27 ENCOUNTER — Other Ambulatory Visit: Payer: Self-pay | Admitting: Family Medicine

## 2022-06-11 ENCOUNTER — Other Ambulatory Visit: Payer: Self-pay | Admitting: Family Medicine

## 2022-06-18 ENCOUNTER — Ambulatory Visit (INDEPENDENT_AMBULATORY_CARE_PROVIDER_SITE_OTHER): Payer: Medicare Other | Admitting: Family Medicine

## 2022-06-18 ENCOUNTER — Encounter: Payer: Self-pay | Admitting: Family Medicine

## 2022-06-18 VITALS — BP 128/46 | HR 62 | Ht 66.0 in | Wt 222.0 lb

## 2022-06-18 DIAGNOSIS — N1832 Chronic kidney disease, stage 3b: Secondary | ICD-10-CM | POA: Diagnosis not present

## 2022-06-18 DIAGNOSIS — R7303 Prediabetes: Secondary | ICD-10-CM | POA: Diagnosis not present

## 2022-06-18 DIAGNOSIS — D692 Other nonthrombocytopenic purpura: Secondary | ICD-10-CM | POA: Diagnosis not present

## 2022-06-18 DIAGNOSIS — F330 Major depressive disorder, recurrent, mild: Secondary | ICD-10-CM

## 2022-06-18 DIAGNOSIS — E785 Hyperlipidemia, unspecified: Secondary | ICD-10-CM

## 2022-06-18 DIAGNOSIS — R002 Palpitations: Secondary | ICD-10-CM

## 2022-06-18 MED ORDER — VERAPAMIL HCL ER 180 MG PO TBCR: 180.0000 mg | EXTENDED_RELEASE_TABLET | Freq: Every day | ORAL | 3 refills | Status: DC

## 2022-06-18 NOTE — Assessment & Plan Note (Addendum)
Under more stress recently, with having to ask her son to move out.  Its been emotionally a big struggle.  But I think she really is doing the right thing by not enabling him.  Kercher to let us know if she needs anything or further supports.  Continue Paxil.  Minded her to be careful about benzodiazepines and that they can be habit-forming.  Make sure to use very sparingly.

## 2022-06-18 NOTE — Assessment & Plan Note (Signed)
Due to recheck renal function. 

## 2022-06-18 NOTE — Assessment & Plan Note (Signed)
Due to recheck hemoglobin A1c.

## 2022-06-18 NOTE — Assessment & Plan Note (Signed)
Discussed the benign nature of this condition.  Did encourage her to stop the aspirin.  We discussed updated guidelines.

## 2022-06-18 NOTE — Assessment & Plan Note (Signed)
Due to recheck lipids. 

## 2022-06-18 NOTE — Progress Notes (Addendum)
Established Patient Office Visit  Subjective   Patient ID: Yvonne Long, female    DOB: 07/03/1951  Age: 71 y.o. MRN: [id number]  Chief Complaint  Patient presents with   Hypertension    HPI  Follow-up palpitations-doing well on the verapamil.  She more recently had an increase in some palpitations but has been under some increased stress and feels like it was probably the stress.  She has been really struggling a little bit more with her mood.  She says that she and her husband had bought a house for their son who has been a drug addict.  And finally let him know about a month ago that he was going to have to leave and move out because he was not doing his part to pay the mortgage.  That was part of the agreement when they brought him the house that he would be responsible and pay it and stay off of drugs.her husband feels like she has Marketing executive.  She did want to let me know that she had a few lorazepam left over from when her mom passed away in 01/03/24 so did take 1 recently.  Impaired fasting glucose-no increased thirst or urination. No symptoms consistent with hypoglycemia.  She also wanted to let me know that she is fallen 3 times since she was last here.  Once she stepped on water on her deck and slipped.  She says ever since she had her back surgery she does not have a lot of flexibility in her great toe on her right foot and so sometimes it catches on rugs so she fell in her bathroom while they were on vacation.  ROS    Objective:     BP (!) 128/46   Pulse 62   Ht '5\' 6"'$  (1.676 m)   Wt 222 lb (100.7 kg)   SpO2 95%   BMI 35.83 kg/m    Physical Exam Vitals and nursing note reviewed.  Constitutional:      Appearance: She is well-developed.  HENT:     Head: Normocephalic and atraumatic.  Cardiovascular:     Rate and Rhythm: Normal rate and regular rhythm.     Heart sounds: Normal heart sounds.  Pulmonary:     Effort: Pulmonary effort is normal.      Breath sounds: Normal breath sounds.  Skin:    General: Skin is warm and dry.  Neurological:     Mental Status: She is alert and oriented to person, place, and time.  Psychiatric:        Behavior: Behavior normal.      No results found for any visits on 06/18/22.    The 10-year ASCVD risk score (Arnett DK, et al., 2019) is: 7.4%    Assessment & Plan:   Problem List Items Addressed This Visit       Cardiovascular and Mediastinum   Senile purpura ([place])    Discussed the benign nature of this condition.  Did encourage her to stop the aspirin.  We discussed updated guidelines.      Relevant Medications   verapamil (CALAN-SR) 180 MG CR tablet     Genitourinary   Chronic kidney disease (CKD) stage G3b/A1, moderately decreased glomerular filtration rate (GFR) between 30-44 mL/min/1.73 square meter and albuminuria creatinine ratio less than 30 mg/g (HCC)    Due to recheck renal function.        Relevant Orders   COMPLETE METABOLIC PANEL WITH GFR   Lipid Panel w/reflex Direct LDL  HgB A1c   CBC     Other   Prediabetes - Primary    Due to recheck hemoglobin A1c.      Relevant Orders   COMPLETE METABOLIC PANEL WITH GFR   Lipid Panel w/reflex Direct LDL   HgB A1c   CBC   PALPITATIONS   Relevant Medications   verapamil (CALAN-SR) 180 MG CR tablet   Other Relevant Orders   COMPLETE METABOLIC PANEL WITH GFR   Lipid Panel w/reflex Direct LDL   HgB A1c   CBC   Major depressive disorder, recurrent episode ([place])    Under more stress recently, with having to ask her son to move out.  Its been emotionally a big struggle.  But I think she really is doing the right thing by not enabling him.  Kercher to let us know if she needs anything or further supports.  Continue Paxil.  Minded her to be careful about benzodiazepines and that they can be habit-forming.  Make sure to use very sparingly.      Hyperlipidemia    Due to recheck lipids.      Relevant Medications    verapamil (CALAN-SR) 180 MG CR tablet   Other Relevant Orders   COMPLETE METABOLIC PANEL WITH GFR   Lipid Panel w/reflex Direct LDL   HgB A1c   CBC    Return in about 6 months (around 12/18/2022) for Mood and BP check .    Beatrice Lecher, MD

## 2022-06-19 DIAGNOSIS — R002 Palpitations: Secondary | ICD-10-CM | POA: Diagnosis not present

## 2022-06-19 DIAGNOSIS — R7303 Prediabetes: Secondary | ICD-10-CM | POA: Diagnosis not present

## 2022-06-19 DIAGNOSIS — E785 Hyperlipidemia, unspecified: Secondary | ICD-10-CM | POA: Diagnosis not present

## 2022-06-19 DIAGNOSIS — N1832 Chronic kidney disease, stage 3b: Secondary | ICD-10-CM | POA: Diagnosis not present

## 2022-06-20 LAB — COMPLETE METABOLIC PANEL WITH GFR
AG Ratio: 1.6 (calc) (ref 1.0–2.5)
ALT: 17 U/L (ref 6–29)
AST: 14 U/L (ref 10–35)
Albumin: 4 g/dL (ref 3.6–5.1)
Alkaline phosphatase (APISO): 71 U/L (ref 37–153)
BUN: 15 mg/dL (ref 7–25)
CO2: 27 mmol/L (ref 20–32)
Calcium: 9.2 mg/dL (ref 8.6–10.4)
Chloride: 106 mmol/L (ref 98–110)
Creat: 0.94 mg/dL (ref 0.50–1.05)
Globulin: 2.5 g/dL (calc) (ref 1.9–3.7)
Glucose, Bld: 94 mg/dL (ref 65–99)
Potassium: 4.7 mmol/L (ref 3.5–5.3)
Sodium: 142 mmol/L (ref 135–146)
Total Bilirubin: 0.5 mg/dL (ref 0.2–1.2)
Total Protein: 6.5 g/dL (ref 6.1–8.1)
eGFR: 66 mL/min/{1.73_m2} (ref >=60)

## 2022-06-20 LAB — CBC
HCT: 42.9 % (ref 35.0–45.0)
Hemoglobin: 13.8 g/dL (ref 11.7–15.5)
MCH: 29.7 pg (ref 27.0–33.0)
MCHC: 32.2 g/dL (ref 32.0–36.0)
MCV: 92.3 fL (ref 80.0–100.0)
MPV: 11.2 fL (ref 7.5–12.5)
Platelets: 252 10*3/uL (ref 140–400)
RBC: 4.65 10*6/uL (ref 3.80–5.10)
RDW: 12.7 % (ref 11.0–15.0)
WBC: 8.8 10*3/uL (ref 3.8–10.8)

## 2022-06-20 LAB — LIPID PANEL W/REFLEX DIRECT LDL
Cholesterol: 152 mg/dL (ref <200)
HDL: 77 mg/dL (ref >=50)
LDL Cholesterol (Calc): 61 mg/dL (calc)
Non-HDL Cholesterol (Calc): 75 mg/dL (calc) (ref <130)
Total CHOL/HDL Ratio: 2 (calc) (ref <5.0)
Triglycerides: 61 mg/dL (ref <150)

## 2022-06-20 LAB — HEMOGLOBIN A1C
Hgb A1c MFr Bld: 6 % of total Hgb — ABNORMAL HIGH (ref <5.7)
Mean Plasma Glucose: 126 mg/dL
eAG (mmol/L): 7 mmol/L

## 2022-06-22 NOTE — Progress Notes (Signed)
Hi Angie, A1c was 6.0 nice and stable.  Your metabolic panel and cholesterol looks great.  Blood count is normal.

## 2022-06-29 DIAGNOSIS — Z1211 Encounter for screening for malignant neoplasm of colon: Secondary | ICD-10-CM | POA: Diagnosis not present

## 2022-07-01 ENCOUNTER — Encounter: Payer: Self-pay | Admitting: Family Medicine

## 2022-07-06 LAB — COLOGUARD: COLOGUARD: NEGATIVE

## 2022-07-07 NOTE — Progress Notes (Signed)
Great news! Your Cologuard test is negative.  Recommend repeat colon cancer screening in 3 years.

## 2022-07-10 ENCOUNTER — Other Ambulatory Visit: Payer: Self-pay

## 2022-07-10 DIAGNOSIS — I491 Atrial premature depolarization: Secondary | ICD-10-CM

## 2022-07-13 ENCOUNTER — Encounter: Payer: Self-pay | Admitting: Family Medicine

## 2022-07-13 NOTE — Telephone Encounter (Signed)
Patient has been scheduled with Dr Mel Almond for tomorrow morning. AMUCK

## 2022-07-13 NOTE — Progress Notes (Unsigned)
   Acute Office Visit  Subjective:     Patient ID: Yvonne Long, female    DOB: [birth year], 71 y.o.   MRN: [id number]  No chief complaint on file.   HPI Patient is in today for abdominal pain. She says it feels like gas pains and has been going on for about 5 days. She has tried pepcid and other OTC that have not provided any relief.  Bowel movements?    Had cologuard testing in May that was negative.   ROS      Objective:    There were no vitals taken for this visit.   Physical Exam  No results found for any visits on 07/14/22.      Assessment & Plan:   Problem List Items Addressed This Visit   None   No orders of the defined types were placed in this encounter.   No follow-ups on file.  Owens Loffler, DO

## 2022-07-14 ENCOUNTER — Ambulatory Visit (INDEPENDENT_AMBULATORY_CARE_PROVIDER_SITE_OTHER): Payer: Medicare Other

## 2022-07-14 ENCOUNTER — Ambulatory Visit (INDEPENDENT_AMBULATORY_CARE_PROVIDER_SITE_OTHER): Payer: Medicare Other | Admitting: Family Medicine

## 2022-07-14 ENCOUNTER — Encounter: Payer: Self-pay | Admitting: Family Medicine

## 2022-07-14 VITALS — BP 142/75 | HR 57 | Ht 66.0 in | Wt 222.0 lb

## 2022-07-14 DIAGNOSIS — I491 Atrial premature depolarization: Secondary | ICD-10-CM | POA: Diagnosis not present

## 2022-07-14 DIAGNOSIS — R1084 Generalized abdominal pain: Secondary | ICD-10-CM | POA: Insufficient documentation

## 2022-07-14 MED ORDER — SIMETHICONE 180 MG PO CAPS: 1.0000 | ORAL_CAPSULE | Freq: Four times a day (QID) | ORAL | 0 refills | Status: DC | PRN

## 2022-07-14 MED ORDER — PANTOPRAZOLE SODIUM 40 MG PO TBEC: 40.0000 mg | DELAYED_RELEASE_TABLET | Freq: Every day | ORAL | 2 refills | Status: DC

## 2022-07-14 NOTE — Patient Instructions (Signed)
Start BRAT diet for two weeks  Start pantoprazole--take daily before meals take with 8oz of water Start simethocone tablets Try heating pad over belly  Peppermint tea

## 2022-07-14 NOTE — Assessment & Plan Note (Signed)
-   since pepcid did provide some relief and pt admitted to burning sensation will go ahead and add pantoprazole to regimen  - will add simethicone tables for gas pain  - provided pt with BRAT diet handout and reviewed foods to help calm her stomach - also recommended heating pad on abdomen and peppermint tea - told pt to follow up in two weeks if symptoms persist. Since patient did have relief with bowel movements it is likely this is an episode of irritable bowel.  - cologuard testing negative in may

## 2022-07-15 DIAGNOSIS — I491 Atrial premature depolarization: Secondary | ICD-10-CM | POA: Diagnosis not present

## 2022-07-29 ENCOUNTER — Other Ambulatory Visit: Payer: Self-pay | Admitting: Family Medicine

## 2022-08-03 ENCOUNTER — Ambulatory Visit: Payer: Medicare Other | Admitting: Cardiology

## 2022-08-07 ENCOUNTER — Ambulatory Visit: Payer: Medicare Other | Admitting: Cardiology

## 2022-08-07 ENCOUNTER — Encounter: Payer: Self-pay | Admitting: Cardiology

## 2022-08-07 VITALS — BP 162/82 | HR 66 | Ht 66.0 in | Wt 223.0 lb

## 2022-08-07 DIAGNOSIS — R002 Palpitations: Secondary | ICD-10-CM

## 2022-08-07 DIAGNOSIS — N1832 Chronic kidney disease, stage 3b: Secondary | ICD-10-CM

## 2022-08-07 DIAGNOSIS — R311 Benign essential microscopic hematuria: Secondary | ICD-10-CM

## 2022-08-07 DIAGNOSIS — R0609 Other forms of dyspnea: Secondary | ICD-10-CM

## 2022-08-07 DIAGNOSIS — G4733 Obstructive sleep apnea (adult) (pediatric): Secondary | ICD-10-CM | POA: Diagnosis not present

## 2022-08-07 MED ORDER — AMLODIPINE BESYLATE 5 MG PO TABS: 5.0000 mg | ORAL_TABLET | Freq: Every day | ORAL | 3 refills | Status: DC

## 2022-08-07 NOTE — Progress Notes (Unsigned)
Cardiology Office Note:    Date:  08/07/2022   ID:  Sammy Douthitt, DOB Dec 12, 1951, MRN [id number]  PCP:  Hali Marry, MD  Cardiologist:  Jenne Campus, MD    Referring MD: Hali Marry, *   No chief complaint on file. Palpitations  History of Present Illness:    Yvonne Long is a 71 y.o. female with past medical history significant for essential hypertension, obstructive sleep apnea, supraventricular ectopy with short supraventricular tachycardias, recently she started having more palpitations, monitor was placed on she was find to have rare short episode of supraventricular tachycardia.  She comes today to discuss that.  She said that palpitations are gone there is a when she got palpitation was the fact that she got very stressful time in her life there was some issue with her son but does issue has been resolved and palpitations completely subsided.  In the matter-of-fact when she wear a monitor she did not have any.  Otherwise she seems to be doing well complaint and concern she has is her loss and she is convinced that this is because because of verapamil that she takes.  Past Medical History:  Diagnosis Date   Allergy    Pollen, dust   Anxiety    Perform exercise to control   Cataract    Will probably have them removed soon.   Chronic kidney disease (CKD) stage G3b/A1, moderately decreased glomerular filtration rate (GFR) between 30-44 mL/min/1.73 square meter and albuminuria creatinine ratio less than 30 mg/g (HCC) 03/19/2016   Depression    Diverticulum    Herpes simplex infection 07/17/2019   Oral and genital   Hyperlipidemia 07/24/2008   Qualifier: Diagnosis of  By: Edman Circle RN, Paula     Hypertension    Take meds to control   Left knee pain 11/20/2015   Major depressive disorder, recurrent episode ([place]) 09/28/2006   Qualifier: Diagnosis of  By: Madilyn Fireman MD, Catherine     OSA (obstructive sleep apnea) 11/11/2018   Study 10/2018  IMPRESSIONS - Mild obstructive sleep apnea occurred during this study (AHI = 14.2/h). - No significant central sleep apnea occurred during this study (CAI = 0.0/h). - Oxygen desaturation was noted during this study (Min O2 = 85%). - Patient snored.   Palpitations 07/05/2007   Qualifier: Diagnosis of  By: Madilyn Fireman MD, Catherine     Prediabetes 04/16/2015   Primary localized osteoarthritis of left knee 11/02/2018   Sleep apnea    Use CPAP   Thoracic back pain 02/12/2016    Past Surgical History:  Procedure Laterality Date   ABDOMINAL HYSTERECTOMY  2005   Dr. Toy Cookey   BILATERAL SALPINGOOPHORECTOMY  2005   Dr. Noralee Stain   CHOLECYSTECTOMY  2013   [place]  2005   with bladder tack   JOINT REPLACEMENT  2021   Left knee   SPINE SURGERY  01/2010   Lumbar Laminectomy-Dr. Owens Shark   TOTAL KNEE ARTHROPLASTY Left 11/14/2018   TOTAL KNEE ARTHROPLASTY Left 11/14/2018   Procedure: left TOTAL KNEE ARTHROPLASTY;  Surgeon: Elsie Saas, MD;  Location: [facility];  Service: Orthopedics;  Laterality: Left;    Current Medications: Current Meds  Medication Sig   AMBULATORY NON FORMULARY MEDICATION Medication Name: CPAP with nasal pillows and humidifier.  Set to AutoPap 4-20 cm watere pressure setting for 10 days and then please fax his download so that we can set her pressure.  New diagnosis of obstructive sleep apnea with AHI of 14.2.  Was faxed to [facility]  atorvastatin (LIPITOR) 20 MG tablet Take 1 tablet (20 mg total) by mouth daily.   BIOTIN PO Take 1 tablet by mouth daily.   Cholecalciferol (VITAMIN D3) 25 MCG (1000 UT) CAPS Take 1,000 Int'l Units by mouth.   Glucosamine HCl 1500 MG TABS Take 2 tablets by mouth daily.   Magnesium 200 MG TABS Take 200 mg by mouth daily.   Melatonin 2.5 MG CHEW Chew 1 each by mouth at bedtime.   Multiple Vitamin (MULTIVITAMIN WITH MINERALS) TABS tablet Take 1 tablet by mouth daily.   pantoprazole (PROTONIX) 40 MG tablet Take 1 tablet (40 mg total) by  mouth daily.   PARoxetine (PAXIL) 40 MG tablet TAKE 1 TABLET BY MOUTH EVERY DAY   Simethicone 180 MG CAPS Take 1 capsule (180 mg total) by mouth 4 (four) times daily as needed (gas/flatulance/bloating).   verapamil (CALAN-SR) 180 MG CR tablet Take 1 tablet (180 mg total) by mouth daily. 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: [employer]    Comment: Retired   Occupation: Freight forwarder    Comment: Part-time  Tobacco Use   Smoking status: Former    Packs/day: 1.00    Years: 15.00    Total pack years: 15.00    Types: Cigarettes    Quit date: 12/21/1994    Years since quitting: 27.6   Smokeless tobacco: Never  Vaping Use   Vaping Use: Never used  Substance and Sexual Activity   Alcohol use: Yes    Alcohol/week: 7.0 standard drinks of alcohol    Types: 7 Glasses of wine per week    Comment: 1 alcoholic drink per day   Drug use: No   Sexual activity: Not Currently    Birth control/protection: Post-menopausal  Other Topics Concern   Not on file  Social History Narrative   Lives with her husband. Enjoys yard work and yoga.    Social Determinants of Health   Financial Resource Strain: Low Risk  (03/15/2022)   Overall Financial Resource Strain (CARDIA)    Difficulty of Paying Living Expenses: Not hard at all  Food Insecurity: No Food Insecurity (03/15/2022)   Hunger Vital Sign    Worried About Running Out of Food in the Last Year: Never true    Ran Out of Food in the Last Year: Never true  Transportation Needs: No Transportation Needs (03/15/2022)   PRAPARE - Hydrologist (Medical): No    Lack of Transportation (Non-Medical): No  Physical Activity: Insufficiently Active  (03/15/2022)   Exercise Vital Sign    Days of Exercise per Week: 2 days    Minutes of Exercise per Session: 30 min  Stress: No Stress Concern Present (03/15/2022)   [place]    Feeling of Stress : Not at all  Social Connections: Moderately Integrated (03/15/2022)   Social Connection and Isolation Panel [NHANES]    Frequency of Communication with Friends and Family: More than three times a week    Frequency of Social Gatherings with Friends and Family: More than three times a week    Attends Religious Services: More than 4 times  per year    Active Member of Clubs or Organizations: No    Attends Archivist Meetings: Patient refused    Marital Status: Married     Family History: The patient's family history includes Alcohol abuse in her father; Anxiety disorder in her father, sister, and son; Arthritis in her maternal grandmother and paternal grandmother; Cancer in her maternal grandmother and another family member; Depression in her father, sister, sister, and son; Drug abuse in her son; Hearing loss in her mother; Heart attack in an other family member; Heart disease in her maternal grandfather and paternal grandfather; Hypertension in her mother; Obesity in her sister and sister; Peripheral vascular disease in her father; Stroke in an other family member; Thrombosis in her father. ROS:   Please see the history of present illness.    All 14 point review of systems negative except as described per history of present illness  EKGs/Labs/Other Studies Reviewed:      Recent Labs: 06/19/2022: ALT 17; BUN 15; Creat 0.94; Hemoglobin 13.8; Platelets 252; Potassium 4.7; Sodium 142  Recent Lipid Panel    Component Value Date/Time   CHOL 152 06/19/2022 0000   TRIG 61 06/19/2022 0000   HDL 77 06/19/2022 0000   CHOLHDL 2.0 06/19/2022 0000   VLDL 31 (H) 07/17/2015 0806   LDLCALC 61 06/19/2022 0000    Physical Exam:     VS:  BP (!) 162/82 (BP Location: Left Arm, Patient Position: Sitting)   Pulse 66   Ht '5\' 6"'$  (1.676 m)   Wt 223 lb 0.6 oz (101.2 kg)   SpO2 96%   BMI 36.00 kg/m     Wt Readings from Last 3 Encounters:  08/07/22 223 lb 0.6 oz (101.2 kg)  07/14/22 222 lb (100.7 kg)  06/18/22 222 lb (100.7 kg)     GEN:  Well nourished, well developed in no acute distress HEENT: Normal NECK: No JVD; No carotid bruits LYMPHATICS: No lymphadenopathy CARDIAC: RRR, no murmurs, no rubs, no gallops RESPIRATORY:  Clear to auscultation without rales, wheezing or rhonchi  ABDOMEN: Soft, non-tender, non-distended MUSCULOSKELETAL:  No edema; No deformity  SKIN: Warm and dry LOWER EXTREMITIES: no swelling NEUROLOGIC:  Alert and oriented x 3 PSYCHIATRIC:  Normal affect   ASSESSMENT:    1. Benign essential microscopic hematuria   2. OSA (obstructive sleep apnea)   3. Palpitations   4. Chronic kidney disease (CKD) stage G3b/A1, moderately decreased glomerular filtration rate (GFR) between 30-44 mL/min/1.73 square meter and albuminuria creatinine ratio less than 30 mg/g (HCC)    PLAN:    In order of problems listed above:  Palpitations denies having any.  She is convinced that her hair loss is related to verapamil.  I explained to her that verapamil of 2 functions 1 is blood pressure control into his arrhythmia control she however would like to try different medications, therefore I will put her on amlodipine 5 mg daily.  I asked her to check blood pressure on the regular basis and let me know if blood pressure is still more than 130/80.  If that is the case we can either increase dose of amlodipine to 10 mg.  I also asked her to let me know how palpitations will behave if she will have recurrence of palpitations then beta-blocker will be initiated. Obstructive sleep apnea she is using CPAP mask encouraged her to continue. I will ask her to have echocardiogram done this is for high blood pressure which seems  to be uncontrolled still.  I would like to see if she got significant left ventricle hypertrophy on top of that I would like to make sure his heart is structurally normal in view of her palpitations. Dyslipidemia.  I did review K PN which only dated from June of this year with LDL of 61 HDL 77.  She is on Lipitor 20 which I will continue   Medication Adjustments/Labs and Tests Ordered: Current medicines are reviewed at length with the patient today.  Concerns regarding medicines are outlined above.  No orders of the defined types were placed in this encounter.  Medication changes: No orders of the defined types were placed in this encounter.   Signed, Park Liter, MD, [facility] 08/07/2022 1:19 PM    [place]

## 2022-08-07 NOTE — Patient Instructions (Signed)
Medication Instructions:  Your physician has recommended you make the following change in your medication:   START: Amlodipine '5mg'$  1 tablet by mouth daily  STOP: Verapamil    Lab Work: None Ordered If you have labs (blood work) drawn today and your tests are completely normal, you will receive your results only by: [place] (if you have MyChart) OR A paper copy in the mail If you have any lab test that is abnormal or we need to change your treatment, we will call you to review the results.   Testing/Procedures: Your physician has requested that you have an echocardiogram. Echocardiography is a painless test that uses sound waves to create images of your heart. It provides your doctor with information about the size and shape of your heart and how well your heart's chambers and valves are working. This procedure takes approximately one hour. There are no restrictions for this procedure.    Follow-Up: At [facility], you and your health needs are our priority.  As part of our continuing mission to provide you with exceptional heart care, we have created designated Provider Care Teams.  These Care Teams include your primary Cardiologist (physician) and Advanced Practice Providers (APPs -  Physician Assistants and Nurse Practitioners) who all work together to provide you with the care you need, when you need it.  We recommend signing up for the patient portal called "MyChart".  Sign up information is provided on this After Visit Summary.  MyChart is used to connect with patients for Virtual Visits (Telemedicine).  Patients are able to view lab/test results, encounter notes, upcoming appointments, etc.  Non-urgent messages can be sent to your provider as well.   To learn more about what you can do with MyChart, go to [email or web address].    Your next appointment:   6 month(s)  The format for your next appointment:   In Person  Provider:   Jenne Campus, MD    Other  Instructions NA

## 2022-08-10 ENCOUNTER — Encounter: Payer: Self-pay | Admitting: Family Medicine

## 2022-08-11 ENCOUNTER — Other Ambulatory Visit: Payer: Self-pay | Admitting: Family Medicine

## 2022-08-11 DIAGNOSIS — F330 Major depressive disorder, recurrent, mild: Secondary | ICD-10-CM

## 2022-08-12 ENCOUNTER — Telehealth: Payer: Medicare Other | Admitting: Family Medicine

## 2022-08-12 ENCOUNTER — Other Ambulatory Visit: Payer: Self-pay | Admitting: Family Medicine

## 2022-08-12 ENCOUNTER — Encounter: Payer: Self-pay | Admitting: Family Medicine

## 2022-08-12 VITALS — Ht 66.0 in | Wt 223.0 lb

## 2022-08-12 DIAGNOSIS — R7301 Impaired fasting glucose: Secondary | ICD-10-CM

## 2022-08-12 DIAGNOSIS — Z7689 Persons encountering health services in other specified circumstances: Secondary | ICD-10-CM | POA: Diagnosis not present

## 2022-08-12 DIAGNOSIS — Z6835 Body mass index (BMI) 35.0-35.9, adult: Secondary | ICD-10-CM

## 2022-08-12 HISTORY — DX: Persons encountering health services in other specified circumstances: Z76.89

## 2022-08-12 MED ORDER — WEGOVY 0.25 MG/0.5ML ~~LOC~~ SOAJ: 0.2500 mg | SUBCUTANEOUS | 0 refills | Status: DC

## 2022-08-12 NOTE — Patient Instructions (Signed)
Some considerations for weight loss would include Wegovy, Saxenda, Qsymia, and  Contrave.  These are all FDA approved for weight loss.

## 2022-08-12 NOTE — Progress Notes (Addendum)
    Virtual Visit via Video Note  I connected with Yvonne Long on 08/12/22 at 10:50 AM EDT by a video enabled telemedicine application and verified that I am speaking with the correct person using two identifiers.   I discussed the limitations of evaluation and management by telemedicine and the availability of in person appointments. The patient expressed understanding and agreed to proceed.  Patient location: at home Provider location: in office  Subjective:    CC:   Chief Complaint  Patient presents with   Weight Loss    HPI: Hx of IFG with last 1C at 6.0.  Really wanting to work on [employer].  He also has a history of CKD 3.  GFR is right around 60.  Is interested in Ozempic to help her lose weight.  She says overall she tries to eat pretty healthy.  In the morning she has almond milk with cereal or either eggs or possibly oatmeal.  She usually eats a pretty late lunch maybe a sandwich and then dinner she tries to really portion control.  Drinks wine daily but has tried to cut back to one glass each evening. She would like to discuss medication options.    Past medical history, Surgical history, Family history not pertinant except as noted below, Social history, Allergies, and medications have been entered into the medical record, reviewed, and corrections made.    Objective:    General: Speaking clearly in complete sentences without any shortness of breath.  Alert and oriented x3.  Normal judgment. No apparent acute distress.    Impression and Recommendations:    Problem List Items Addressed This Visit       Other   Severe obesity (BMI 35.0-35.9 with comorbidity) ([place])   Relevant Medications   Semaglutide-Weight Management (WEGOVY) 0.25 MG/0.5ML SOAJ   Encounter for weight management - Primary    Visit #: 1 Starting Weight: 223 lb   Current weight: 223 lb  Previous weight: Change in weight: Goal weight: Dietary goals: Cont to work on getting  protein with each meal. Exercise goals: Cont to work on increased activity level. Medication: We did discuss medication as an option to assist her. Follow-up and referrals:       Relevant Medications   Semaglutide-Weight Management (WEGOVY) 0.25 MG/0.5ML SOAJ   Other Visit Diagnoses     IFG (impaired fasting glucose)       Relevant Medications   Semaglutide-Weight Management (WEGOVY) 0.25 MG/0.5ML SOAJ       No orders of the defined types were placed in this encounter.   Meds ordered this encounter  Medications   Semaglutide-Weight Management (WEGOVY) 0.25 MG/0.5ML SOAJ    Sig: Inject 0.25 mg into the skin every 7 (seven) days.    Dispense:  2 mL    Refill:  0     I discussed the assessment and treatment plan with the patient. The patient was provided an opportunity to ask questions and all were answered. The patient agreed with the plan and demonstrated an understanding of the instructions.   The patient was advised to call back or seek an in-person evaluation if the symptoms worsen or if the condition fails to improve as anticipated.  I spent 21 minutes on the day of the encounter to include pre-visit record review, face-to-face time with the patient and post visit ordering of test.   Beatrice Lecher, MD

## 2022-08-12 NOTE — Assessment & Plan Note (Signed)
Visit #: 1 Starting Weight: 223 lb   Current weight: 223 lb  Previous weight: Change in weight: Goal weight: Dietary goals: Cont to work on getting protein with each meal. Exercise goals: Cont to work on increased activity level. Medication: We did discuss medication as an option to assist her. Follow-up and referrals:

## 2022-08-14 ENCOUNTER — Other Ambulatory Visit: Payer: Self-pay | Admitting: Family Medicine

## 2022-08-14 DIAGNOSIS — R7301 Impaired fasting glucose: Secondary | ICD-10-CM

## 2022-08-14 DIAGNOSIS — Z7689 Persons encountering health services in other specified circumstances: Secondary | ICD-10-CM

## 2022-08-17 ENCOUNTER — Encounter: Payer: Self-pay | Admitting: Family Medicine

## 2022-08-19 ENCOUNTER — Ambulatory Visit (HOSPITAL_BASED_OUTPATIENT_CLINIC_OR_DEPARTMENT_OTHER)
Admission: RE | Admit: 2022-08-19 | Discharge: 2022-08-19 | Disposition: A | Discharge status: Home / Self Care | Payer: Medicare Other | Source: Ambulatory Visit | Attending: Cardiology | Admitting: Cardiology

## 2022-08-19 DIAGNOSIS — R0609 Other forms of dyspnea: Secondary | ICD-10-CM | POA: Diagnosis not present

## 2022-08-19 LAB — ECHOCARDIOGRAM COMPLETE
AR max vel: 2.21 cm2
AV Area VTI: 2.41 cm2
AV Area mean vel: 2.14 cm2
AV Mean grad: 5 mmHg
AV Peak grad: 8.8 mmHg
Ao pk vel: 1.48 m/s
Area-P 1/2: 5.06 cm2
S' Lateral: 2.9 cm

## 2022-08-19 NOTE — Progress Notes (Signed)
  Echocardiogram 2D Echocardiogram has been performed.  Yvonne Long F 08/19/2022, 12:44 PM

## 2022-08-20 ENCOUNTER — Encounter: Payer: Self-pay | Admitting: Family Medicine

## 2022-08-21 DIAGNOSIS — L649 Androgenic alopecia, unspecified: Secondary | ICD-10-CM | POA: Diagnosis not present

## 2022-08-21 DIAGNOSIS — R233 Spontaneous ecchymoses: Secondary | ICD-10-CM | POA: Diagnosis not present

## 2022-08-21 MED ORDER — METFORMIN HCL 500 MG PO TABS: 500.0000 mg | ORAL_TABLET | Freq: Two times a day (BID) | ORAL | 3 refills | Status: DC

## 2022-09-03 ENCOUNTER — Ambulatory Visit (INDEPENDENT_AMBULATORY_CARE_PROVIDER_SITE_OTHER): Payer: Medicare Other

## 2022-09-03 DIAGNOSIS — Z1231 Encounter for screening mammogram for malignant neoplasm of breast: Secondary | ICD-10-CM | POA: Diagnosis not present

## 2022-09-03 DIAGNOSIS — Z0000 Encounter for general adult medical examination without abnormal findings: Secondary | ICD-10-CM

## 2022-09-04 NOTE — Progress Notes (Signed)
Please call patient. Normal mammogram.  Repeat in 1 year.  

## 2022-09-14 DIAGNOSIS — M1711 Unilateral primary osteoarthritis, right knee: Secondary | ICD-10-CM | POA: Diagnosis not present

## 2022-09-17 ENCOUNTER — Encounter: Payer: Self-pay | Admitting: Family Medicine

## 2022-09-18 ENCOUNTER — Encounter: Payer: Self-pay | Admitting: Family Medicine

## 2022-09-23 ENCOUNTER — Encounter: Payer: Self-pay | Admitting: Cardiology

## 2022-10-06 ENCOUNTER — Telehealth: Payer: Self-pay | Admitting: *Deleted

## 2022-10-06 DIAGNOSIS — M1711 Unilateral primary osteoarthritis, right knee: Secondary | ICD-10-CM | POA: Diagnosis not present

## 2022-10-06 NOTE — Telephone Encounter (Signed)
   Pre-operative Risk Assessment    Patient Name: Yvonne Long  DOB: [birth year] MRN: [id number]      Request for Surgical Clearance    Procedure:   RIGHT TOTAL KNEE ARTHROPLASTY  Date of Surgery:  Clearance TBD                                 Surgeon:  DR. DANIEL MARCHWIANY Surgeon's Group or Practice Name:  [facility] Phone number:  [phone] EXT [phone] ATTN: [facility] Fax number:  [phone]   Type of Clearance Requested:   - Medical ; NO MEDICATIONS LISTED AS NEEDING TO BE HELD   Type of Anesthesia:  Spinal   Additional requests/questions:    Jiles Prows   10/06/2022, 11:05 AM

## 2022-10-07 ENCOUNTER — Telehealth: Payer: Self-pay | Admitting: *Deleted

## 2022-10-07 NOTE — Telephone Encounter (Signed)
Pt has been scheduled for a tele visit, 10/19/22 2:00.  Consent on file / medications reconciled.

## 2022-10-07 NOTE — Telephone Encounter (Signed)
Pt has been scheduled for a tele visit, 10/19/22 2:00.  Consent on file / medications reconciled.    Patient Consent for Virtual Visit        Yvonne Long has provided verbal consent on 10/07/2022 for a virtual visit (video or telephone).   CONSENT FOR VIRTUAL VISIT FOR:  Yvonne Long  By participating in this virtual visit I agree to the following:  I hereby voluntarily request, consent and authorize [facility] and its employed or contracted physicians, physician assistants, nurse practitioners or other licensed health care professionals (the Practitioner), to provide me with telemedicine health care services (the "Services") as deemed necessary by the treating Practitioner. I acknowledge and consent to receive the Services by the Practitioner via telemedicine. I understand that the telemedicine visit will involve communicating with the Practitioner through live audiovisual communication technology and the disclosure of certain medical information by electronic transmission. I acknowledge that I have been given the opportunity to request an in-person assessment or other available alternative prior to the telemedicine visit and am voluntarily participating in the telemedicine visit.  I understand that I have the right to withhold or withdraw my consent to the use of telemedicine in the course of my care at any time, without affecting my right to future care or treatment, and that the Practitioner or I may terminate the telemedicine visit at any time. I understand that I have the right to inspect all information obtained and/or recorded in the course of the telemedicine visit and may receive copies of available information for a reasonable fee.  I understand that some of the potential risks of receiving the Services via telemedicine include:  Delay or interruption in medical evaluation due to technological equipment failure or disruption; Information transmitted may not  be sufficient (e.g. poor resolution of images) to allow for appropriate medical decision making by the Practitioner; and/or  In rare instances, security protocols could fail, causing a breach of personal health information.  Furthermore, I acknowledge that it is my responsibility to provide information about my medical history, conditions and care that is complete and accurate to the best of my ability. I acknowledge that Practitioner's advice, recommendations, and/or decision may be based on factors not within their control, such as incomplete or inaccurate data provided by me or distortions of diagnostic images or specimens that may result from electronic transmissions. I understand that the practice of medicine is not an exact science and that Practitioner makes no warranties or guarantees regarding treatment outcomes. I acknowledge that a copy of this consent can be made available to me via my patient portal ([facility]), or I can request a printed copy by calling the office of [facility].    I understand that my insurance will be billed for this visit.   I have read or had this consent read to me. I understand the contents of this consent, which adequately explains the benefits and risks of the Services being provided via telemedicine.  I have been provided ample opportunity to ask questions regarding this consent and the Services and have had my questions answered to my satisfaction. I give my informed consent for the services to be provided through the use of telemedicine in my medical care

## 2022-10-07 NOTE — Telephone Encounter (Signed)
   Name: Yvonne Long  DOB: Oct 15, 1951  MRN: [id number]  Primary Cardiologist: None   Preoperative team, please contact this patient and set up a phone call appointment for further preoperative risk assessment. Please obtain consent and complete medication review. Thank you for your help.  I confirm that guidance regarding antiplatelet and oral anticoagulation therapy has been completed and, if necessary, noted below (none requested).    [name], NP 10/07/2022, 4:18 PM [place]

## 2022-10-08 ENCOUNTER — Other Ambulatory Visit: Payer: Self-pay | Admitting: Family Medicine

## 2022-10-19 ENCOUNTER — Ambulatory Visit: Payer: Medicare Other | Attending: Cardiovascular Disease | Admitting: Nurse Practitioner

## 2022-10-19 DIAGNOSIS — Z01810 Encounter for preprocedural cardiovascular examination: Secondary | ICD-10-CM

## 2022-10-19 DIAGNOSIS — R0609 Other forms of dyspnea: Secondary | ICD-10-CM

## 2022-10-19 NOTE — Progress Notes (Addendum)
Virtual Visit via Telephone Note   Because of Yvonne Long's co-morbid illnesses, she is at least at moderate risk for complications without adequate follow up.  This format is felt to be most appropriate for this patient at this time.  The patient did not have access to video technology/had technical difficulties with video requiring transitioning to audio format only (telephone).  All issues noted in this document were discussed and addressed.  No physical exam could be performed with this format.  Please refer to the patient's chart for her consent to telehealth for [facility].  Evaluation Performed:  Preoperative cardiovascular risk assessment _____________   Date:  10/19/2022   Patient ID:  Yvonne Long, DOB [birth year], MRN [id number] Patient Location:  Home Provider location:   Office  Primary Care Provider:  Hali Marry, MD Primary Cardiologist:  Jenne Campus, MD  Chief Complaint / Patient Profile   71 y.o. y/o female with a h/o HTN, OSA on CPAP, HLD, palpitations, prediabetes, obesity, and supraventricular ectopy with short SVTwho is pending right total knee arthroplasty and presents today for telephonic preoperative cardiovascular risk assessment.   Past Medical History    Past Medical History:  Diagnosis Date   Allergy    Pollen, dust   Anxiety    Perform exercise to control   Cataract    Will probably have them removed soon.   Chronic kidney disease (CKD) stage G3b/A1, moderately decreased glomerular filtration rate (GFR) between 30-44 mL/min/1.73 square meter and albuminuria creatinine ratio less than 30 mg/g (HCC) 03/19/2016   Depression    Diverticulum    Herpes simplex infection 07/17/2019   Oral and genital   Hyperlipidemia 07/24/2008   Qualifier: Diagnosis of  By: Edman Circle RN, Paula     Hypertension    Take meds to control   Left knee pain 11/20/2015   Major depressive disorder, recurrent episode ([place]) 09/28/2006    Qualifier: Diagnosis of  By: Madilyn Fireman MD, Catherine     OSA (obstructive sleep apnea) 11/11/2018   Study 10/2018 IMPRESSIONS - Mild obstructive sleep apnea occurred during this study (AHI = 14.2/h). - No significant central sleep apnea occurred during this study (CAI = 0.0/h). - Oxygen desaturation was noted during this study (Min O2 = 85%). - Patient snored.   Palpitations 07/05/2007   Qualifier: Diagnosis of  By: Madilyn Fireman MD, Catherine     Prediabetes 04/16/2015   Primary localized osteoarthritis of left knee 11/02/2018   Sleep apnea    Use CPAP   Thoracic back pain 02/12/2016   Past Surgical History:  Procedure Laterality Date   ABDOMINAL HYSTERECTOMY  2005   Dr. Toy Cookey   BILATERAL SALPINGOOPHORECTOMY  2005   Dr. Noralee Stain   CHOLECYSTECTOMY  2013   [place]  2005   with bladder tack   JOINT REPLACEMENT  2021   Left knee   SPINE SURGERY  01/2010   Lumbar Laminectomy-Dr. Owens Shark   TOTAL KNEE ARTHROPLASTY Left 11/14/2018   TOTAL KNEE ARTHROPLASTY Left 11/14/2018   Procedure: left TOTAL KNEE ARTHROPLASTY;  Surgeon: Elsie Saas, MD;  Location: [facility];  Service: Orthopedics;  Laterality: Left;    Allergies  Allergies  Allergen Reactions   Codeine Nausea Only and Other (See Comments)    On an empty stomach only    History of Present Illness    Yvonne Long is a 71 y.o. female who presents via audio/video conferencing for a telehealth visit today.  Pt was last seen in cardiology clinic on 08/07/2022  by Dr. Agustin Cree.  At that time Yvonne Long was doing well.  Prior to the visit, she had a stressful time in her life and associated palpitations with that.  A previous cardiac monitor found short episode of SVT.  When she wore the monitor, she did not have any palpitations.  At the last visit with Dr. Agustin Cree, she requested to come off verapamil as she was convinced this was causing hair loss.  Dr. Agustin Cree put her on amlodipine 5 mg daily.  He  recommended that if her palpitations recur, he will consider initiation of beta-blocker.  Her updated echocardiogram revealed EF 60 to 65%, no RWMA, mild LVH, grade 1 DD, and no significant valvular abnormalities.  The patient is now pending procedure as outlined above. She presents today via telehealth visit for preoperative cardiovascular risk assessment for upcoming right total knee arthroplasty.  Surgery will be performed by Dr. Charlies Constable of [place].  No medications are listed as needing to be held.  Type of anesthesia will include spinal anesthesia.  Date of surgery will be TBD. She says since the last visit, she says she has been doing well.  Does note some shortness of breath with exertion over the past few months, stable.  Notices most often with walking or bending over.  Denies any chest pains.  States palpitations are very rare in occurrence, not bothersome.  Overall denies any other cardiac concerns or complaints.  Blood pressure is stable at home.  Denies any other questions or concerns today.  Home Medications    Prior to Admission medications   Medication Sig Start Date End Date Taking? Authorizing Provider  AMBULATORY NON FORMULARY MEDICATION Medication Name: CPAP with nasal pillows and humidifier.  Set to AutoPap 4-20 cm watere pressure setting for 10 days and then please fax his download so that we can set her pressure.  New diagnosis of obstructive sleep apnea with AHI of 14.2.  Was faxed to [facility] 07/17/19   Hali Marry, MD  amLODipine (NORVASC) 5 MG tablet Take 1 tablet (5 mg total) by mouth daily. 08/07/22   Park Liter, MD  atorvastatin (LIPITOR) 20 MG tablet Take 1 tablet (20 mg total) by mouth daily. 07/30/22   Hali Marry, MD  BIOTIN PO Take 1 tablet by mouth daily.    [provider]  Cholecalciferol (VITAMIN D3) 25 MCG (1000 UT) CAPS Take 1,000 Int'l Units by mouth.    [provider]  finasteride  (PROSCAR) 5 MG tablet Take 5 mg by mouth daily.    [provider]  Glucosamine HCl 1500 MG TABS Take 2 tablets by mouth daily.    [provider]  Melatonin 2.5 MG CHEW Chew 1 each by mouth at bedtime.    [provider]  metFORMIN (GLUCOPHAGE) 500 MG tablet Take 1 tablet (500 mg total) by mouth 2 (two) times daily with a meal. 08/21/22   Hali Marry, MD  pantoprazole (PROTONIX) 40 MG tablet TAKE 1 TABLET BY MOUTH EVERY DAY 10/08/22   Hali Marry, MD  PARoxetine (PAXIL) 40 MG tablet TAKE 1 TABLET BY MOUTH EVERY DAY 08/11/22   Hali Marry, MD    Physical Exam    Vital Signs:  Yvonne Long does not have vital signs available for review today.  Given telephonic nature of communication, physical exam is limited. AAOx3. NAD. Normal affect.  Speech and respirations are unlabored.  Accessory Clinical Findings    None  Assessment &  Plan    1.  Preoperative Cardiovascular Risk Assessment:  Yvonne Long's perioperative risk of a major cardiac event is 0.4% according to the Revised Cardiac Risk Index (RCRI).  Therefore, she is at low risk for perioperative complications.   Her functional capacity is excellent at 7.53 METs according to the Duke Activity Status Index (DASI). Recommendations: Recent echocardiogram in August 2023 was overall very reassuring.  However, due to dyspnea on exertion that has worsened since last year, plan to arrange Lexiscan stress test.  If this test comes back within normal limits, she may proceed to surgery at an acceptable risk and will route note to requesting surgeon.  I went over risk and benefits as outlined below with her, and she is agreeable to proceed. Antiplatelet and/or Anticoagulation Recommendations: Patient is not on any antiplatelet or anticoagulation to be held prior to procedure.  She does not require any SBE prophylaxis prior to surgery. Shared Decision Making/Informed Consent The  risks [chest pain, shortness of breath, cardiac arrhythmias, dizziness, blood pressure fluctuations, myocardial infarction, stroke/transient ischemic attack, nausea, vomiting, allergic reaction, radiation exposure, metallic taste sensation and life-threatening complications (estimated to be 1 in 10,000)], benefits (risk stratification, diagnosing coronary artery disease, treatment guidance) and alternatives of a nuclear stress test were discussed in detail with Yvonne Long and she agrees to proceed.   Addendum 11/04/22: Yvonne Long results came back overall low risk, and normal. She is at an acceptable risk to proceed with her upcoming surgery. Will route note to surgeon's office.   The patient was advised that if she develops new symptoms prior to surgery to contact our office to arrange for a follow-up visit, and she verbalized understanding.  A copy of this note will be routed to requesting surgeon.  Time:   Today, I have spent 10 minutes with the patient with telehealth technology discussing medical history, symptoms, and management plan.      Finis Bud, NP  10/19/2022, 1:21 PM

## 2022-10-19 NOTE — Progress Notes (Deleted)
{Choose 1 Note Type (Video or Telephone):[phone]}    Date:  10/19/2022   ID:  Yvonne Long, DOB August 07, 1951, MRN [id number] The patient was identified using 2 identifiers.  {Patient Location:[phone]::"Home"} {Provider [phone]::"Home Office"}   PCP:  Hali Marry, MD   [facility] Providers Cardiologist:  None { Click to update primary MD,subspecialty MD or APP then REFRESH:1}    Evaluation Performed:  {Choose Visit Type:[phone]::"Follow-Up Visit"}  Chief Complaint:  ***  History of Present Illness:    Yvonne Long is a 71 y.o. female with ***         Past Medical History:  Diagnosis Date   Allergy    Pollen, dust   Anxiety    Perform exercise to control   Cataract    Will probably have them removed soon.   Chronic kidney disease (CKD) stage G3b/A1, moderately decreased glomerular filtration rate (GFR) between 30-44 mL/min/1.73 square meter and albuminuria creatinine ratio less than 30 mg/g (HCC) 03/19/2016   Depression    Diverticulum    Herpes simplex infection 07/17/2019   Oral and genital   Hyperlipidemia 07/24/2008   Qualifier: Diagnosis of  By: Edman Circle RN, Paula     Hypertension    Take meds to control   Left knee pain 11/20/2015   Major depressive disorder, recurrent episode ([place]) 09/28/2006   Qualifier: Diagnosis of  By: Madilyn Fireman MD, Catherine     OSA (obstructive sleep apnea) 11/11/2018   Study 10/2018 IMPRESSIONS - Mild obstructive sleep apnea occurred during this study (AHI = 14.2/h). - No significant central sleep apnea occurred during this study (CAI = 0.0/h). - Oxygen desaturation was noted during this study (Min O2 = 85%). - Patient snored.   Palpitations 07/05/2007   Qualifier: Diagnosis of  By: Madilyn Fireman MD, Catherine     Prediabetes 04/16/2015   Primary localized osteoarthritis of left knee 11/02/2018   Sleep apnea    Use CPAP   Thoracic back pain 02/12/2016   Past Surgical History:   Procedure Laterality Date   ABDOMINAL HYSTERECTOMY  2005   Dr. Toy Cookey   BILATERAL SALPINGOOPHORECTOMY  2005   Dr. Noralee Stain   CHOLECYSTECTOMY  2013   [place]  2005   with bladder tack   JOINT REPLACEMENT  2021   Left knee   SPINE SURGERY  01/2010   Lumbar Laminectomy-Dr. Owens Shark   TOTAL KNEE ARTHROPLASTY Left 11/14/2018   TOTAL KNEE ARTHROPLASTY Left 11/14/2018   Procedure: left TOTAL KNEE ARTHROPLASTY;  Surgeon: Elsie Saas, MD;  Location: [facility];  Service: Orthopedics;  Laterality: Left;     No outpatient medications have been marked as taking for the 10/19/22 encounter (Appointment) with [facility] OP CLEARANCE APP.     Allergies:   Codeine   Social History   Tobacco Use   Smoking status: Former    Packs/day: 1.00    Years: 15.00    Total pack years: 15.00    Types: Cigarettes    Quit date: 12/21/1994    Years since quitting: 27.8   Smokeless tobacco: Never  Vaping Use   Vaping Use: Never used  Substance Use Topics   Alcohol use: Yes    Alcohol/week: 7.0 standard drinks of alcohol    Types: 7 Glasses of wine per week    Comment: 1 alcoholic drink per day   Drug use: No     Family Hx: The patient's family history includes Alcohol abuse in her father; Anxiety disorder in her father, sister, and son; Arthritis  in her maternal grandmother and paternal grandmother; Cancer in her maternal grandmother and another family member; Depression in her father, sister, sister, and son; Drug abuse in her son; Hearing loss in her mother; Heart attack in an other family member; Heart disease in her maternal grandfather and paternal grandfather; Hypertension in her mother; Obesity in her sister and sister; Peripheral vascular disease in her father; Stroke in an other family member; Thrombosis in her father.  ROS:   Please see the history of present illness.    *** All other systems reviewed and are negative.   Prior CV studies:   The following studies were  reviewed today:  ***  Labs/Other Tests and Data Reviewed:    EKG:  {EKG/Telemetry Strips Reviewed:[phone]}  Recent Labs: 06/19/2022: ALT 17; BUN 15; Creat 0.94; Hemoglobin 13.8; Platelets 252; Potassium 4.7; Sodium 142   Recent Lipid Panel Lab Results  Component Value Date/Time   CHOL 152 06/19/2022 12:00 AM   TRIG 61 06/19/2022 12:00 AM   HDL 77 06/19/2022 12:00 AM   CHOLHDL 2.0 06/19/2022 12:00 AM   LDLCALC 61 06/19/2022 12:00 AM    Wt Readings from Last 3 Encounters:  08/12/22 223 lb (101.2 kg)  08/07/22 223 lb 0.6 oz (101.2 kg)  07/14/22 222 lb (100.7 kg)     Risk Assessment/Calculations:   {Does this patient have ATRIAL FIBRILLATION?[phone]}      Objective:    Vital Signs:  There were no vitals taken for this visit.   {[facility] Virtual Exam (Optional):[phone]::"VITAL SIGNS:  reviewed"}  ASSESSMENT & PLAN:    ***      {Are you ordering a CV Procedure (e.g. stress test, cath, DCCV, TEE, etc)?   Press F2        :[id number]}     Time:   Today, I have spent *** minutes with the patient with telehealth technology discussing the above problems.     Medication Adjustments/Labs and Tests Ordered: Current medicines are reviewed at length with the patient today.  Concerns regarding medicines are outlined above.   Tests Ordered: No orders of the defined types were placed in this encounter.   Medication Changes: No orders of the defined types were placed in this encounter.   Follow Up:  {F/U Format:[phone]} {follow up:15908}  Signed, Finis Bud, NP  10/19/2022 8:16 AM    [facility]

## 2022-10-20 NOTE — Addendum Note (Signed)
Addended by: Michae Kava on: 10/20/2022 08:07 AM   Modules accepted: Orders

## 2022-10-20 NOTE — Addendum Note (Signed)
Addended by: Michae Kava on: 10/20/2022 08:24 AM   Modules accepted: Orders

## 2022-10-28 ENCOUNTER — Telehealth: Payer: Self-pay | Admitting: Family Medicine

## 2022-10-28 NOTE — Telephone Encounter (Signed)
Surgical form completed for medical clearance only.  She is also getting cardiac clearance.  Form faxed back.

## 2022-10-29 ENCOUNTER — Ambulatory Visit (HOSPITAL_COMMUNITY): Payer: Medicare Other | Attending: Nurse Practitioner

## 2022-10-29 DIAGNOSIS — R0609 Other forms of dyspnea: Secondary | ICD-10-CM | POA: Insufficient documentation

## 2022-10-29 DIAGNOSIS — Z01810 Encounter for preprocedural cardiovascular examination: Secondary | ICD-10-CM | POA: Diagnosis not present

## 2022-10-29 DIAGNOSIS — R110 Nausea: Secondary | ICD-10-CM | POA: Insufficient documentation

## 2022-10-29 MED ORDER — TECHNETIUM TC 99M TETROFOSMIN IV KIT
10.3000 | PACK | Freq: Once | INTRAVENOUS | Status: AC | PRN
  Administered 2022-10-29: 10.3 via INTRAVENOUS

## 2022-10-30 ENCOUNTER — Ambulatory Visit (HOSPITAL_COMMUNITY): Payer: Medicare Other | Attending: Internal Medicine

## 2022-10-30 DIAGNOSIS — Z01810 Encounter for preprocedural cardiovascular examination: Secondary | ICD-10-CM | POA: Diagnosis not present

## 2022-10-30 DIAGNOSIS — R110 Nausea: Secondary | ICD-10-CM | POA: Insufficient documentation

## 2022-10-30 DIAGNOSIS — R0609 Other forms of dyspnea: Secondary | ICD-10-CM | POA: Insufficient documentation

## 2022-10-30 LAB — MYOCARDIAL PERFUSION IMAGING
LV dias vol: 61 mL (ref 46–106)
LV sys vol: 23 mL
Nuc Stress EF: 62 %
Peak HR: 94 {beats}/min
Rest HR: 67 {beats}/min
Rest Nuclear Isotope Dose: 10.3 mCi
SDS: 0
SRS: 0
SSS: 0
ST Depression (mm): 0 mm
Stress Nuclear Isotope Dose: 31.7 mCi
TID: 1.01

## 2022-10-30 MED ORDER — REGADENOSON 0.4 MG/5ML IV SOLN
0.4000 mg | Freq: Once | INTRAVENOUS | Status: AC
  Administered 2022-10-30: 0.4 mg via INTRAVENOUS

## 2022-10-30 MED ORDER — TECHNETIUM TC 99M TETROFOSMIN IV KIT
31.7000 | PACK | Freq: Once | INTRAVENOUS | Status: AC | PRN
  Administered 2022-10-30: 31.7 via INTRAVENOUS

## 2022-10-30 MED ORDER — AMINOPHYLLINE 25 MG/ML IV SOLN
75.0000 mg | Freq: Once | INTRAVENOUS | Status: AC
  Administered 2022-10-30: 75 mg via INTRAVENOUS

## 2022-10-30 NOTE — Telephone Encounter (Signed)
Surgical form faxed and confirmation received

## 2022-11-02 ENCOUNTER — Encounter: Payer: Self-pay | Admitting: Nurse Practitioner

## 2022-11-04 NOTE — Telephone Encounter (Signed)
This encounter was created in error - please disregard.

## 2022-11-06 ENCOUNTER — Ambulatory Visit (HOSPITAL_COMMUNITY): Payer: Medicare Other

## 2022-11-17 ENCOUNTER — Other Ambulatory Visit: Payer: Self-pay | Admitting: Family Medicine

## 2022-11-17 ENCOUNTER — Encounter: Payer: Self-pay | Admitting: Family Medicine

## 2022-11-17 DIAGNOSIS — Z7689 Persons encountering health services in other specified circumstances: Secondary | ICD-10-CM

## 2022-11-17 MED ORDER — WEGOVY 0.25 MG/0.5ML ~~LOC~~ SOAJ: 0.2500 mg | SUBCUTANEOUS | 0 refills | Status: DC

## 2022-11-17 NOTE — Telephone Encounter (Signed)
Hi [place], can you initiate a PA on the [facility].    CKD 3 Prediabets Hypertension Hyperlipidemia  OSA MOrbid obesity.

## 2022-11-17 NOTE — Telephone Encounter (Signed)
Did you want to change or have patient look at different pharmacies?

## 2022-11-18 ENCOUNTER — Telehealth: Payer: Self-pay

## 2022-11-18 NOTE — Telephone Encounter (Signed)
I would encourage her to call around and see which pharmacy might have it available.  She might want to try med [facility] as well.

## 2022-11-18 NOTE — Telephone Encounter (Signed)
I would encourage her to call around and see which pharmacy might have it available.  She might want to try med [facility] as well.

## 2022-11-18 NOTE — Telephone Encounter (Addendum)
Initiated Prior authorization FQM:[id number] 0.'25MG'$ /0.5ML auto-injectors Via: Covermymeds Case/Key:[id number] Status: denied  as of 11/18/22 Reason:Drugs, when used for anorexia, weight loss or weight gain, are excluded from coverage under Medicare rules. Please refer to your Evidence of Coverage (EOC) document that details your Medicare prescription drug coverage Notified Pt via: Mychart

## 2022-11-20 DIAGNOSIS — Z20822 Contact with and (suspected) exposure to covid-19: Secondary | ICD-10-CM | POA: Diagnosis not present

## 2022-11-20 DIAGNOSIS — I10 Essential (primary) hypertension: Secondary | ICD-10-CM | POA: Diagnosis not present

## 2022-11-20 DIAGNOSIS — J00 Acute nasopharyngitis [common cold]: Secondary | ICD-10-CM | POA: Diagnosis not present

## 2022-11-22 ENCOUNTER — Telehealth: Payer: Medicare Other | Admitting: Family

## 2022-11-22 DIAGNOSIS — R6889 Other general symptoms and signs: Secondary | ICD-10-CM | POA: Diagnosis not present

## 2022-11-22 MED ORDER — PREDNISONE 10 MG (21) PO TBPK: ORAL_TABLET | ORAL | 0 refills | Status: DC

## 2022-11-22 MED ORDER — BENZONATATE 100 MG PO CAPS: 100.0000 mg | ORAL_CAPSULE | Freq: Three times a day (TID) | ORAL | 0 refills | Status: DC | PRN

## 2022-11-22 MED ORDER — OSELTAMIVIR PHOSPHATE 75 MG PO CAPS: 75.0000 mg | ORAL_CAPSULE | Freq: Two times a day (BID) | ORAL | 0 refills | Status: DC

## 2022-11-22 NOTE — Progress Notes (Signed)
Virtual Visit Consent   Yvonne Long, you are scheduled for a virtual visit with a [facility] provider today. Just as with appointments in the office, your consent must be obtained to participate. Your consent will be active for this visit and any virtual visit you may have with one of our providers in the next 365 days. If you have a MyChart account, a copy of this consent can be sent to you electronically.  As this is a virtual visit, video technology does not allow for your provider to perform a traditional examination. This may limit your provider's ability to fully assess your condition. If your provider identifies any concerns that need to be evaluated in person or the need to arrange testing (such as labs, EKG, etc.), we will make arrangements to do so. Although advances in technology are sophisticated, we cannot ensure that it will always work on either your end or our end. If the connection with a video visit is poor, the visit may have to be switched to a telephone visit. With either a video or telephone visit, we are not always able to ensure that we have a secure connection.  By engaging in this virtual visit, you consent to the provision of healthcare and authorize for your insurance to be billed (if applicable) for the services provided during this visit. Depending on your insurance coverage, you may receive a charge related to this service.  I need to obtain your verbal consent now. Are you willing to proceed with your visit today? Yvonne Long has provided verbal consent on 11/22/2022 for a virtual visit (video or telephone). Evelina Dun, FNP  Date: 11/22/2022 9:06 AM  Virtual Visit via Video Note   I, Evelina Dun, connected with  Yvonne Long  ([id number], 05-29-1951) on 11/22/22 at  9:15 AM EST by a video-enabled telemedicine application and verified that I am speaking with the correct person using two identifiers.  Location: Patient: Virtual Visit  Location Patient: Home Provider: Virtual Visit Location Provider: Home Office   I discussed the limitations of evaluation and management by telemedicine and the availability of in person appointments. The patient expressed understanding and agreed to proceed.    History of Present Illness: Yvonne Long is a 71 y.o. who identifies as a female who was assigned female at birth, and is being seen today for flu like symptoms that started two days ago. Has had two negative COVID tests.   HPI: Cough This is a new problem. The current episode started in the past 7 days. The problem has been resolved. The problem occurs every few minutes. The cough is Non-productive. Associated symptoms include chills, ear congestion, ear pain, a fever, headaches, myalgias, nasal congestion, shortness of breath and wheezing. She has tried rest for the symptoms.    Problems:  Patient Active Problem List   Diagnosis Date Noted   Encounter for weight management 08/12/2022   Generalized abdominal pain 07/14/2022   Senile purpura ([place]) 06/18/2022   Benign essential microscopic hematuria 06/03/2021   Mid back pain on right side 06/02/2021   Herpes simplex infection 07/17/2019   OSA (obstructive sleep apnea) 11/11/2018   Primary localized osteoarthritis of left knee 11/02/2018   Chronic kidney disease (CKD) stage G3b/A1, moderately decreased glomerular filtration rate (GFR) between 30-44 mL/min/1.73 square meter and albuminuria creatinine ratio less than 30 mg/g (HCC) 03/19/2016   Thoracic back pain 02/12/2016   Left knee pain 11/20/2015   Prediabetes 04/16/2015   Hyperlipidemia 07/24/2008   PALPITATIONS 07/05/2007  Major depressive disorder, recurrent episode ([place]) 09/28/2006    Allergies:  Allergies  Allergen Reactions   Codeine Nausea Only and Other (See Comments)    On an empty stomach only   Medications:  Current Outpatient Medications:    benzonatate (TESSALON PERLES) 100 MG capsule, Take 1  capsule (100 mg total) by mouth 3 (three) times daily as needed., Disp: 20 capsule, Rfl: 0   oseltamivir (TAMIFLU) 75 MG capsule, Take 1 capsule (75 mg total) by mouth 2 (two) times daily., Disp: 10 capsule, Rfl: 0   predniSONE (STERAPRED UNI-PAK 21 TAB) 10 MG (21) TBPK tablet, Use as directed, Disp: 21 tablet, Rfl: 0   AMBULATORY NON FORMULARY MEDICATION, Medication Name: CPAP with nasal pillows and humidifier.  Set to AutoPap 4-20 cm watere pressure setting for 10 days and then please fax his download so that we can set her pressure.  New diagnosis of obstructive sleep apnea with AHI of 14.2.  Was faxed to [facility], Disp: 1 vial, Rfl: 0   amLODipine (NORVASC) 5 MG tablet, Take 1 tablet (5 mg total) by mouth daily., Disp: 90 tablet, Rfl: 3   atorvastatin (LIPITOR) 20 MG tablet, Take 1 tablet (20 mg total) by mouth daily., Disp: 90 tablet, Rfl: 3   BIOTIN PO, Take 1 tablet by mouth daily., Disp: , Rfl:    Cholecalciferol (VITAMIN D3) 25 MCG (1000 UT) CAPS, Take 1,000 Int'l Units by mouth., Disp: , Rfl:    finasteride (PROSCAR) 5 MG tablet, Take 5 mg by mouth daily., Disp: , Rfl:    Glucosamine HCl 1500 MG TABS, Take 2 tablets by mouth daily., Disp: , Rfl:    Melatonin 2.5 MG CHEW, Chew 1 each by mouth at bedtime., Disp: , Rfl:    pantoprazole (PROTONIX) 40 MG tablet, TAKE 1 TABLET BY MOUTH EVERY DAY, Disp: 90 tablet, Rfl: 1   PARoxetine (PAXIL) 40 MG tablet, TAKE 1 TABLET BY MOUTH EVERY DAY, Disp: 90 tablet, Rfl: 1   Semaglutide-Weight Management (WEGOVY) 0.25 MG/0.5ML SOAJ, Inject 0.25 mg into the skin every 7 (seven) days., Disp: 2 mL, Rfl: 0  Observations/Objective: Patient is well-developed, well-nourished in no acute distress.  Resting comfortably  at home.  Head is normocephalic, atraumatic.  No labored breathing.  Speech is clear and coherent with logical content.  Patient is alert and oriented at baseline.  Deep coarse cough  Assessment and Plan: 1. Flu-like symptoms -  oseltamivir (TAMIFLU) 75 MG capsule; Take 1 capsule (75 mg total) by mouth 2 (two) times daily.  Dispense: 10 capsule; Refill: 0 - predniSONE (STERAPRED UNI-PAK 21 TAB) 10 MG (21) TBPK tablet; Use as directed  Dispense: 21 tablet; Refill: 0 - benzonatate (TESSALON PERLES) 100 MG capsule; Take 1 capsule (100 mg total) by mouth 3 (three) times daily as needed.  Dispense: 20 capsule; Refill: 0  Rest Force fluids Tylenol  Start Tamiflu today Follow up if symptoms worsen or do not improve   Follow Up Instructions: I discussed the assessment and treatment plan with the patient. The patient was provided an opportunity to ask questions and all were answered. The patient agreed with the plan and demonstrated an understanding of the instructions.  A copy of instructions were sent to the patient via MyChart unless otherwise noted below.     The patient was advised to call back or seek an in-person evaluation if the symptoms worsen or if the condition fails to improve as anticipated.  Time:  I spent 12 minutes with the  patient via telehealth technology discussing the above problems/concerns.    Evelina Dun, FNP

## 2022-11-23 ENCOUNTER — Encounter: Payer: Self-pay | Admitting: Family Medicine

## 2022-11-26 DIAGNOSIS — N8111 Cystocele, midline: Secondary | ICD-10-CM | POA: Diagnosis not present

## 2022-11-26 DIAGNOSIS — Z9889 Other specified postprocedural states: Secondary | ICD-10-CM | POA: Diagnosis not present

## 2022-11-26 DIAGNOSIS — N3281 Overactive bladder: Secondary | ICD-10-CM | POA: Diagnosis not present

## 2022-11-27 DIAGNOSIS — L649 Androgenic alopecia, unspecified: Secondary | ICD-10-CM | POA: Diagnosis not present

## 2022-12-14 ENCOUNTER — Other Ambulatory Visit: Payer: Self-pay | Admitting: Family Medicine

## 2022-12-14 DIAGNOSIS — F330 Major depressive disorder, recurrent, mild: Secondary | ICD-10-CM

## 2022-12-18 ENCOUNTER — Ambulatory Visit (INDEPENDENT_AMBULATORY_CARE_PROVIDER_SITE_OTHER): Payer: Medicare Other | Admitting: Family Medicine

## 2022-12-18 ENCOUNTER — Encounter: Payer: Self-pay | Admitting: Family Medicine

## 2022-12-18 VITALS — BP 129/58 | HR 94 | Ht 66.0 in

## 2022-12-18 DIAGNOSIS — G4733 Obstructive sleep apnea (adult) (pediatric): Secondary | ICD-10-CM

## 2022-12-18 DIAGNOSIS — F330 Major depressive disorder, recurrent, mild: Secondary | ICD-10-CM

## 2022-12-18 DIAGNOSIS — R7301 Impaired fasting glucose: Secondary | ICD-10-CM

## 2022-12-18 DIAGNOSIS — E669 Obesity, unspecified: Secondary | ICD-10-CM

## 2022-12-18 DIAGNOSIS — E1169 Type 2 diabetes mellitus with other specified complication: Secondary | ICD-10-CM

## 2022-12-18 LAB — POCT GLYCOSYLATED HEMOGLOBIN (HGB A1C): Hemoglobin A1C: 8.4 % — AB (ref 4.0–5.6)

## 2022-12-18 MED ORDER — OZEMPIC (0.25 OR 0.5 MG/DOSE) 2 MG/3ML ~~LOC~~ SOPN: 0.2500 mg | PEN_INJECTOR | SUBCUTANEOUS | 0 refills | Status: DC

## 2022-12-18 MED ORDER — METFORMIN HCL 500 MG PO TABS: 500.0000 mg | ORAL_TABLET | Freq: Two times a day (BID) | ORAL | 3 refills | Status: DC

## 2022-12-18 MED ORDER — AMBULATORY NON FORMULARY MEDICATION: 0 refills | Status: AC

## 2022-12-18 MED ORDER — PAROXETINE HCL 30 MG PO TABS: 30.0000 mg | ORAL_TABLET | Freq: Every day | ORAL | 1 refills | Status: DC

## 2022-12-18 NOTE — Assessment & Plan Note (Signed)
A1c jumped up to 8.4 today.  Previously in the prediabetes range and we have been monitoring her every 6 months.  She is most interested in a GLP-1.  She has been taking her metformin consistently.  Will see if the insurance will cover Ozempic.  She says that her husband is on it and it is covered with the insurance.  Continue with metformin as well.  Follow-up in 2 to 3 months to make sure that she is tolerating it well and so that we can adjust her dose.  We need to work on healthy food choices and regular exercise.

## 2022-12-18 NOTE — Assessment & Plan Note (Signed)
Doing well with Paxil she would like to go down to 30 mg.  New prescription sent to pharmacy.  Follow-up in 3 months.

## 2022-12-18 NOTE — Assessment & Plan Note (Addendum)
She says that humidifier tank on her CPAP machine is no longer working she has it taped together.  Her machine is quite old and she would like a new updated machine.  Will get a new order placed.

## 2022-12-18 NOTE — Progress Notes (Signed)
Established Patient Office Visit  Subjective   Patient ID: Yvonne Long, female    DOB: Jul 08, 1951  Age: 71 y.o. MRN: [id number]  Chief Complaint  Patient presents with   Hypertension   mood    HPI  6 mo f/u:   F/u major depressive disorder-she is doing well overall she thinks she is ready to drop down to 30 mg on the Paxil.  Things have been pretty stable she still worries a lot about her son.  SHe is scheduled for knee replacement in February.  Is still interested in the option of weight loss medication particularly Wegovy.  We had tried to apply for coverage last year but it was not covered.  Impaired fasting glucose-no increased thirst or urination. No symptoms consistent with hypoglycemia.     ROS    Objective:     BP (!) 129/58   Pulse 94   Ht '5\' 6"'$  (1.676 m)   SpO2 93%   BMI 35.99 kg/m    Physical Exam Vitals and nursing note reviewed.  Constitutional:      Appearance: She is well-developed.  HENT:     Head: Normocephalic and atraumatic.  Cardiovascular:     Rate and Rhythm: Normal rate and regular rhythm.     Heart sounds: Normal heart sounds.  Pulmonary:     Effort: Pulmonary effort is normal.     Breath sounds: Normal breath sounds.  Skin:    General: Skin is warm and dry.  Neurological:     Mental Status: She is alert and oriented to person, place, and time.  Psychiatric:        Behavior: Behavior normal.      Results for orders placed or performed in visit on 12/18/22  POCT glycosylated hemoglobin (Hb A1C)  Result Value Ref Range   Hemoglobin A1C 8.4 (A) 4.0 - 5.6 %   HbA1c POC (<> result, manual entry)     HbA1c, POC (prediabetic range)     HbA1c, POC (controlled diabetic range)        The 10-year ASCVD risk score (Arnett DK, et al., 2019) is: 20.3%    Assessment & Plan:   Problem List Items Addressed This Visit       Respiratory   OSA (obstructive sleep apnea)    She says that humidifier tank on her CPAP  machine is no longer working she has it taped together.  Her machine is quite old and she would like a new updated machine.  Will get a new order placed.      Relevant Medications   AMBULATORY NON FORMULARY MEDICATION     Endocrine   Diabetes mellitus type 2 in obese (HCC)    A1c jumped up to 8.4 today.  Previously in the prediabetes range and we have been monitoring her every 6 months.  She is most interested in a GLP-1.  She has been taking her metformin consistently.  Will see if the insurance will cover Ozempic.  She says that her husband is on it and it is covered with the insurance.  Continue with metformin as well.  Follow-up in 2 to 3 months to make sure that she is tolerating it well and so that we can adjust her dose.  We need to work on healthy food choices and regular exercise.      Relevant Medications   Semaglutide,0.25 or 0.'5MG'$ /DOS, (OZEMPIC, 0.25 OR 0.5 MG/DOSE,) 2 MG/3ML SOPN   metFORMIN (GLUCOPHAGE) 500 MG tablet  Other   Major depressive disorder, recurrent episode ([place]) - Primary    Doing well with Paxil she would like to go down to 30 mg.  New prescription sent to pharmacy.  Follow-up in 3 months.      Relevant Medications   PARoxetine (PAXIL) 30 MG tablet   Other Visit Diagnoses     IFG (impaired fasting glucose)       Relevant Orders   POCT glycosylated hemoglobin (Hb A1C) (Completed)       Return in about 2 months (around 02/18/2023) for New start medication.    Beatrice Lecher, MD

## 2022-12-23 NOTE — Telephone Encounter (Signed)
This is new, needs appt. Ok to schedule at [facility] if we don't have anything. [place] for [employer]

## 2022-12-24 ENCOUNTER — Other Ambulatory Visit: Payer: Self-pay | Admitting: Family Medicine

## 2022-12-25 ENCOUNTER — Encounter: Payer: Self-pay | Admitting: Family Medicine

## 2022-12-25 ENCOUNTER — Other Ambulatory Visit: Payer: Self-pay

## 2022-12-25 DIAGNOSIS — F330 Major depressive disorder, recurrent, mild: Secondary | ICD-10-CM

## 2022-12-25 MED ORDER — AMLODIPINE BESYLATE 5 MG PO TABS: 5.0000 mg | ORAL_TABLET | Freq: Every day | ORAL | 1 refills | Status: DC

## 2022-12-25 MED ORDER — PAROXETINE HCL 30 MG PO TABS: 30.0000 mg | ORAL_TABLET | Freq: Every day | ORAL | 1 refills | Status: DC

## 2022-12-25 MED ORDER — METFORMIN HCL 500 MG PO TABS: 500.0000 mg | ORAL_TABLET | Freq: Two times a day (BID) | ORAL | 1 refills | Status: DC

## 2022-12-25 MED ORDER — PANTOPRAZOLE SODIUM 40 MG PO TBEC: 40.0000 mg | DELAYED_RELEASE_TABLET | Freq: Every day | ORAL | 1 refills | Status: DC

## 2022-12-25 MED ORDER — ATORVASTATIN CALCIUM 20 MG PO TABS: 20.0000 mg | ORAL_TABLET | Freq: Every day | ORAL | 1 refills | Status: DC

## 2022-12-28 ENCOUNTER — Encounter: Payer: Self-pay | Admitting: Family Medicine

## 2022-12-28 DIAGNOSIS — E1169 Type 2 diabetes mellitus with other specified complication: Secondary | ICD-10-CM

## 2022-12-29 NOTE — Telephone Encounter (Signed)
Orders Placed This Encounter  Procedures   Amb ref to Medical Nutrition Therapy-MNT    Referral Priority:   Routine    Referral Type:   Consultation    Referral Reason:   Specialty Services Required    Requested Specialty:   Nutrition    Number of Visits Requested:   1

## 2022-12-31 ENCOUNTER — Encounter: Payer: Self-pay | Admitting: Family Medicine

## 2023-01-09 ENCOUNTER — Encounter: Payer: Self-pay | Admitting: Family Medicine

## 2023-01-11 ENCOUNTER — Encounter: Payer: Self-pay | Admitting: Skilled Nursing Facility1

## 2023-01-11 ENCOUNTER — Encounter: Payer: Medicare Other | Attending: Family Medicine | Admitting: Skilled Nursing Facility1

## 2023-01-11 DIAGNOSIS — E1169 Type 2 diabetes mellitus with other specified complication: Secondary | ICD-10-CM | POA: Insufficient documentation

## 2023-01-11 DIAGNOSIS — E669 Obesity, unspecified: Secondary | ICD-10-CM | POA: Insufficient documentation

## 2023-01-11 DIAGNOSIS — N1832 Chronic kidney disease, stage 3b: Secondary | ICD-10-CM | POA: Diagnosis not present

## 2023-01-11 NOTE — Progress Notes (Signed)
Diabetes Self-Management Education  Visit Type: First/Initial   01/11/2023  [name], identified by name and date of birth, is a 72 y.o. female with a diagnosis of Diabetes: Type 2.   ASSESSMENT  There were no vitals taken for this visit. There is no height or weight on file to calculate BMI.  A1C 8.4  DM med: Metformin 500 mg 2 times a day    Other Dx: Depression Anxiety History sleep apnea Hyperlipidemia  HTN Stage 3 CKD  Pt arrived too late for a full appt so the rest of the appt will be finsihed up at her follow up appt.  Pt states her appetite has decreased in a good way. Pt state she is working on getting her knee replaced. Pt states she gets pedicures often: educated pt on this topic. 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 Pt sates she has stomach pain and feels tired a lot stating the protonix has helped.    Diabetes Self-Management Education - 01/11/23 1133       Visit Information   Visit Type First/Initial      Initial Visit   Diabetes Type Type 2    Are you currently following a meal plan? No    Are you taking your medications as prescribed? Yes      Health Coping   How would you rate your overall health? Fair      Psychosocial Assessment   Patient Belief/Attitude about Diabetes Motivated to manage diabetes    What is the hardest part about your diabetes right now, causing you the most concern, or is the most worrisome to you about your diabetes?   Making healty food and beverage choices    Self-care barriers None    Patient Concerns Nutrition/Meal planning;Weight Control    Special Needs None    Preferred Learning Style Visual;Auditory    Learning Readiness Contemplating    How often do you need to have someone help you when you read instructions, pamphlets, or other written materials from your doctor or pharmacy? 1 - Never      Pre-Education Assessment   Patient understands the diabetes disease and treatment  process. Needs Instruction    Patient understands incorporating nutritional management into lifestyle. Needs Instruction    Patient undertands incorporating physical activity into lifestyle. Needs Instruction    Patient understands using medications safely. Needs Instruction    Patient understands monitoring blood glucose, interpreting and using results Needs Instruction    Patient understands prevention, detection, and treatment of acute complications. Needs Instruction    Patient understands prevention, detection, and treatment of chronic complications. Needs Instruction    Patient understands how to develop strategies to address psychosocial issues. Needs Instruction    Patient understands how to develop strategies to promote health/change behavior. Needs Instruction      Complications   Last HgB A1C per patient/outside source 8.6 %    How often do you check your blood sugar? 0 times/day (not testing)    Have you had a dilated eye exam in the past 12 months? Yes    Have you had a dental exam in the past 12 months? Yes    Are you checking your feet? No      Dietary Intake   Breakfast fruit chunk yogurt or cereal    Lunch salad and burger patty or vegetable soup    Snack (afternoon) oranage or grapes    Dinner steak and salad  Snack (evening) 2 cookies    Beverage(s) water, wine, coffee, milk      Activity / Exercise   Activity / Exercise Type ADL's    How many days per week do you exercise? 0    How many minutes per day do you exercise? 0    Total minutes per week of exercise 0      Patient Education   Previous Diabetes Education No    Disease Pathophysiology Definition of diabetes, type 1 and 2, and the diagnosis of diabetes;Factors that contribute to the development of diabetes    Healthy Eating Role of diet in the treatment of diabetes and the relationship between the three main macronutrients and blood glucose level;Food label reading, portion sizes and measuring food.;Plate  Method;Carbohydrate counting;Information on hints to eating out and maintain blood glucose control.    Being Active Role of exercise on diabetes management, blood pressure control and cardiac health.    Diabetes Stress and Support Identified and addressed patients feelings and concerns about diabetes;Role of stress on diabetes      Individualized Goals (developed by patient)   Nutrition Follow meal plan discussed;General guidelines for healthy choices and portions discussed    Problem Solving Eating Pattern      Post-Education Assessment   Patient understands the diabetes disease and treatment process. Needs Review    Patient understands incorporating nutritional management into lifestyle. Needs Review    Patient undertands incorporating physical activity into lifestyle. Needs Review    Patient understands using medications safely. Needs Review    Patient understands monitoring blood glucose, interpreting and using results Needs Review    Patient understands prevention, detection, and treatment of acute complications. Needs Review    Patient understands prevention, detection, and treatment of chronic complications. Needs Review    Patient understands how to develop strategies to address psychosocial issues. Needs Review    Patient understands how to develop strategies to promote health/change behavior. Needs Review      Outcomes   Expected Outcomes Demonstrated interest in learning. Expect positive outcomes    Future DMSE 2 wks    Program Status Completed             Individualized Plan for Diabetes Self-Management Training:   Learning Objective:  Patient will have a greater understanding of diabetes self-management. Patient education plan is to attend individual and/or group sessions per assessed needs and concerns.   Expected Outcomes:  Demonstrated interest in learning. Expect positive outcomes  Education material provided: ADA - How to Thrive: A Guide for Your Journey with  Diabetes, Food label handouts, A1C conversion sheet, Meal plan card, My Plate, and Carbohydrate counting sheet  If problems or questions, patient to contact team via:  Phone and Email  Future DSME appointment: 2 wks

## 2023-01-14 NOTE — Patient Instructions (Signed)
DUE TO COVID-19 ONLY TWO VISITORS  (aged 71 and older)  ARE ALLOWED TO COME WITH YOU AND STAY IN THE WAITING ROOM ONLY DURING PRE OP AND PROCEDURE.   **NO VISITORS ARE ALLOWED IN THE SHORT STAY AREA OR RECOVERY ROOM!!**  IF YOU WILL BE ADMITTED INTO THE HOSPITAL YOU ARE ALLOWED ONLY FOUR SUPPORT PEOPLE DURING VISITATION HOURS ONLY (7 AM -8PM)   The support person(s) must pass our screening, gel in and out, and wear a mask at all times, including in the patient's room. Patients must also wear a mask when staff or their support person are in the room. Visitors GUEST BADGE MUST BE WORN VISIBLY  One adult visitor may remain with you overnight and MUST be in the room by 8 P.M.     Your procedure is scheduled on: 02/03/23   Report to [facility] Main Entrance    Report to admitting at 8:45  AM   Call this number if you have problems the morning of surgery [phone]   Do not eat food :After Midnight.   After Midnight you may have the following liquids until _8:15_____ AM/  DAY OF SURGERY  Water Black Coffee (sugar ok, NO MILK/CREAM OR CREAMERS)  Tea (sugar ok, NO MILK/CREAM OR CREAMERS) regular and decaf                             Plain Jell-O (NO RED)                                           Fruit ices (not with fruit pulp, NO RED)                                     Popsicles (NO RED)                                                                  Juice: apple, WHITE grape, WHITE cranberry Sports drinks like Gatorade (NO RED)                   The day of surgery:  Drink ONE (1)  G2 at  8:00 AM the morning of surgery. Drink in one sitting. Do not sip.  This drink was given to you during your hospital  pre-op appointment visit. Nothing else to drink after completing the   G2. At 8:15 AM          If you have questions, please contact your surgeon's office.    Oral Hygiene is also important to reduce your risk of infection.                                    Remember  - BRUSH YOUR TEETH THE MORNING OF SURGERY WITH YOUR REGULAR TOOTHPASTE  DENTURES WILL BE REMOVED PRIOR TO SURGERY PLEASE DO NOT APPLY "Poly grip" OR ADHESIVES!!!   Do NOT smoke after Midnight   Take these medicines the  morning of surgery with A SIP OF WATER: Paxil- Paroxetine                                                                                                                           Atorvastatin                                                                                                                           Amlodipine                                                                                                                           Finasteride                                                                                                                           Pantoprazole   DO NOT TAKE ANY ORAL DIABETIC MEDICATIONS DAY OF YOUR SURGERY( Metformin)  Hold your Ozempic for 7 days. 01/27/23.  If you take it on Friday don't take the 2/9 dose. Last dose will be 2/2 /24.   Bring CPAP mask and tubing day of surgery.                              You may not have any metal on your body including hair pins, jewelry, and body piercing  Do not wear make-up, lotions, powders, perfumes/cologne, or deodorant  Do not wear nail polish including gel and S&S, artificial/acrylic nails, or any other type of covering on natural nails including finger and toenails. If you have artificial nails, gel coating, etc. that needs to be removed by a nail salon please have this removed prior to surgery or surgery may need to be canceled/ delayed if the surgeon/ anesthesia feels like they are unable to be safely monitored.   Do not shave  48 hours prior to surgery.     Do not bring valuables to the hospital. [place].   Contacts, glasses, or bridgework may not be worn into surgery.   Bring small overnight bag day of surgery.    DO NOT [place].     Patients discharged on the day of surgery will not be allowed to drive home.  Someone NEEDS to stay with you for the first 24 hours after anesthesia.   Special Instructions: Bring a copy of your healthcare power of attorney and living will documents   the day of surgery if you haven't scanned them before.              Please read over the following fact sheets you were given: IF YOU HAVE QUESTIONS ABOUT YOUR PRE-OP INSTRUCTIONS PLEASE CALL [phone]    [facility] - Preparing for Surgery Before surgery, you can play an important role.  Because skin is not sterile, your skin needs to be as free of germs as possible.  You can reduce the number of germs on your skin by washing with CHG (chlorahexidine gluconate) soap before surgery.  CHG is an antiseptic cleaner which kills germs and bonds with the skin to continue killing germs even after washing. Please DO NOT use if you have an allergy to CHG or antibacterial soaps.  If your skin becomes reddened/irritated stop using the CHG and inform your nurse when you arrive at Short Stay. Do not shave (including legs and underarms) for at least 48 hours prior to the first CHG shower.   Please follow these instructions carefully:  1.  Shower with CHG Soap the night before surgery and the  morning of Surgery.  2.  If you choose to wash your hair, wash your hair first as usual with your  normal  shampoo.  3.  After you shampoo, rinse your hair and body thoroughly to remove the  shampoo.                            4.  Use CHG as you would any other liquid soap.  You can apply chg directly  to the skin and wash                       Gently with a scrungie or clean washcloth.  5.  Apply the CHG Soap to your body ONLY FROM THE NECK DOWN.   Do not use on face/ open                           Wound or open sores. Avoid contact with eyes, ears mouth and genitals (private parts).  Wash  face,  Genitals (private parts) with your normal soap.             6.  Wash thoroughly, paying special attention to the area where your surgery  will be performed.  7.  Thoroughly rinse your body with warm water from the neck down.  8.  DO NOT shower/wash with your normal soap after using and rinsing off  the CHG Soap.                9.  Pat yourself dry with a clean towel.            10.  Wear clean pajamas.            11.  Place clean sheets on your bed the night of your first shower and do not  sleep with pets. Day of Surgery : Do not apply any lotions/deodorants the morning of surgery.  Please wear clean clothes to the hospital/surgery center.  FAILURE TO FOLLOW THESE INSTRUCTIONS MAY RESULT IN THE CANCELLATION OF YOUR SURGERY   ________________________________________________________________________  Incentive Spirometer  An incentive spirometer is a tool that can help keep your lungs clear and active. This tool measures how well you are filling your lungs with each breath. Taking long deep breaths may help reverse or decrease the chance of developing breathing (pulmonary) problems (especially infection) following: A long period of time when you are unable to move or be active. BEFORE THE PROCEDURE  If the spirometer includes an indicator to show your best effort, your nurse or respiratory therapist will set it to a desired goal. If possible, sit up straight or lean slightly forward. Try not to slouch. Hold the incentive spirometer in an upright position. INSTRUCTIONS FOR USE  Sit on the edge of your bed if possible, or sit up as far as you can in bed or on a chair. Hold the incentive spirometer in an upright position. Breathe out normally. Place the mouthpiece in your mouth and seal your lips tightly around it. Breathe in slowly and as deeply as possible, raising the piston or the ball toward the top of the column. Hold your breath for 3-5 seconds or for as long as possible. Allow  the piston or ball to fall to the bottom of the column. Remove the mouthpiece from your mouth and breathe out normally. Rest for a few seconds and repeat Steps 1 through 7 at least 10 times every 1-2 hours when you are awake. Take your time and take a few normal breaths between deep breaths. The spirometer may include an indicator to show your best effort. Use the indicator as a goal to work toward during each repetition. After each set of 10 deep breaths, practice coughing to be sure your lungs are clear. If you have an incision (the cut made at the time of surgery), support your incision when coughing by placing a pillow or rolled up towels firmly against it. Once you are able to get out of bed, walk around indoors and cough well. You may stop using the incentive spirometer when instructed by your caregiver.  RISKS AND COMPLICATIONS Take your time so you do not get dizzy or light-headed. If you are in pain, you may need to take or ask for pain medication before doing incentive spirometry. It is harder to take a deep breath if you are having pain. AFTER USE Rest and breathe slowly and easily. It can be helpful to keep track of a log of your  progress. Your caregiver can provide you with a simple table to help with this. If you are using the spirometer at home, follow these instructions: [place] IF:  You are having difficultly using the spirometer. You have trouble using the spirometer as often as instructed. Your pain medication is not giving enough relief while using the spirometer. You develop fever of 100.5 F (38.1 C) or higher. SEEK IMMEDIATE MEDICAL CARE IF:  You cough up bloody sputum that had not been present before. You develop fever of 102 F (38.9 C) or greater. You develop worsening pain at or near the incision site. MAKE SURE YOU:  Understand these instructions. Will watch your condition. Will get help right away if you are not doing well or get worse. Document  Released: 04/19/2007 Document Revised: 02/29/2012 Document Reviewed: 06/20/2007 [facility] Patient Information 2014 [place], [place].   ________________________________________________________________________

## 2023-01-15 DIAGNOSIS — M1711 Unilateral primary osteoarthritis, right knee: Secondary | ICD-10-CM | POA: Diagnosis not present

## 2023-01-15 NOTE — Progress Notes (Signed)
Sent message, via epic in basket, requesting orders in epic from surgeon.  

## 2023-01-18 NOTE — Addendum Note (Signed)
Addended by: Beatrice Lecher D on: 01/18/2023 01:58 PM   Modules accepted: Orders

## 2023-01-21 ENCOUNTER — Encounter (HOSPITAL_COMMUNITY): Payer: Self-pay

## 2023-01-21 ENCOUNTER — Encounter (HOSPITAL_COMMUNITY)
Admission: RE | Admit: 2023-01-21 | Discharge: 2023-01-21 | Disposition: A | Discharge status: Home / Self Care | Payer: Medicare Other | Source: Ambulatory Visit | Attending: Orthopedic Surgery | Admitting: Orthopedic Surgery

## 2023-01-21 ENCOUNTER — Ambulatory Visit: Payer: Self-pay | Admitting: Physician Assistant

## 2023-01-21 ENCOUNTER — Other Ambulatory Visit: Payer: Self-pay

## 2023-01-21 DIAGNOSIS — G8929 Other chronic pain: Secondary | ICD-10-CM | POA: Diagnosis not present

## 2023-01-21 DIAGNOSIS — Z01818 Encounter for other preprocedural examination: Secondary | ICD-10-CM

## 2023-01-21 DIAGNOSIS — M1711 Unilateral primary osteoarthritis, right knee: Secondary | ICD-10-CM | POA: Insufficient documentation

## 2023-01-21 DIAGNOSIS — E1169 Type 2 diabetes mellitus with other specified complication: Secondary | ICD-10-CM | POA: Insufficient documentation

## 2023-01-21 DIAGNOSIS — E669 Obesity, unspecified: Secondary | ICD-10-CM | POA: Diagnosis not present

## 2023-01-21 DIAGNOSIS — M25561 Pain in right knee: Secondary | ICD-10-CM | POA: Diagnosis not present

## 2023-01-21 HISTORY — DX: Gastro-esophageal reflux disease without esophagitis: K21.9

## 2023-01-21 HISTORY — DX: Type 2 diabetes mellitus without complications: E11.9

## 2023-01-21 LAB — COMPREHENSIVE METABOLIC PANEL
ALT: 25 U/L (ref 0–44)
AST: 19 U/L (ref 15–41)
Albumin: 3.6 g/dL (ref 3.5–5.0)
Alkaline Phosphatase: 54 U/L (ref 38–126)
Anion gap: 9 (ref 5–15)
BUN: 10 mg/dL (ref 8–23)
CO2: 25 mmol/L (ref 22–32)
Calcium: 8.6 mg/dL — ABNORMAL LOW (ref 8.9–10.3)
Chloride: 105 mmol/L (ref 98–111)
Creatinine, Ser: 0.76 mg/dL (ref 0.44–1.00)
GFR, Estimated: >60 mL/min (ref >60)
Glucose, Bld: 119 mg/dL — ABNORMAL HIGH (ref 70–99)
Potassium: 3.9 mmol/L (ref 3.5–5.1)
Sodium: 139 mmol/L (ref 135–145)
Total Bilirubin: 0.6 mg/dL (ref 0.3–1.2)
Total Protein: 6.8 g/dL (ref 6.5–8.1)

## 2023-01-21 LAB — CBC WITH DIFFERENTIAL/PLATELET
Abs Immature Granulocytes: 0.02 10*3/uL (ref 0.00–0.07)
Basophils Absolute: 0.1 10*3/uL (ref 0.0–0.1)
Basophils Relative: 1 %
Eosinophils Absolute: 0.3 10*3/uL (ref 0.0–0.5)
Eosinophils Relative: 4 %
HCT: 43.6 % (ref 36.0–46.0)
Hemoglobin: 13.7 g/dL (ref 12.0–15.0)
Immature Granulocytes: 0 %
Lymphocytes Relative: 24 %
Lymphs Abs: 1.8 10*3/uL (ref 0.7–4.0)
MCH: 29.3 pg (ref 26.0–34.0)
MCHC: 31.4 g/dL (ref 30.0–36.0)
MCV: 93.2 fL (ref 80.0–100.0)
Monocytes Absolute: 0.6 10*3/uL (ref 0.1–1.0)
Monocytes Relative: 8 %
Neutro Abs: 4.8 10*3/uL (ref 1.7–7.7)
Neutrophils Relative %: 63 %
Platelets: 300 10*3/uL (ref 150–400)
RBC: 4.68 MIL/uL (ref 3.87–5.11)
RDW: 13.5 % (ref 11.5–15.5)
WBC: 7.6 10*3/uL (ref 4.0–10.5)
nRBC: 0 % (ref 0.0–0.2)

## 2023-01-21 LAB — TYPE AND SCREEN
ABO/RH(D): A POS
Antibody Screen: NEGATIVE

## 2023-01-21 LAB — GLUCOSE, CAPILLARY: Glucose-Capillary: 124 mg/dL — ABNORMAL HIGH (ref 70–99)

## 2023-01-21 LAB — HEMOGLOBIN A1C
Hgb A1c MFr Bld: 6.7 % — ABNORMAL HIGH (ref 4.8–5.6)
Mean Plasma Glucose: 145.59 mg/dL

## 2023-01-21 LAB — SURGICAL PCR SCREEN
MRSA, PCR: NEGATIVE
Staphylococcus aureus: NEGATIVE

## 2023-01-21 NOTE — Progress Notes (Signed)
Anesthesia note:  Bowel prep reminder:  NA  PCP - Dr. Cheral Marker Cardiologist -none Other-   Chest x-ray - no EKG - 01/21/23 Stress Test - 10/30/22-epic ECHO - 08/19/22 Cardiac Cath - no CABG-no Pacemaker/ICD device last checked:no  Sleep Study - yes CPAP - yes   CBG at PAT visit-124 Fasting Blood Sugar at home-doesn't test Checks Blood Sugar __O___  Blood Thinner:no Blood Thinner Instructions: Aspirin Instructions: Last Dose:  Anesthesia review: Yes / reason:  Patient denies shortness of breath, fever, cough and chest pain at PAT appointment. Pt reports no SOB with any activities. She has been Dx with DM. Tried Ozempic but no longer takes it.   Patient verbalized understanding of instructions that were given to them at the PAT appointment. Patient was also instructed that they will need to review over the PAT instructions again at home before surgery.yes

## 2023-01-21 NOTE — H&P (Signed)
TOTAL KNEE ADMISSION H&P  Patient is being admitted for right total knee arthroplasty.  Subjective:  Chief Complaint:right knee pain.  HPI: Yvonne Long, 71 y.o. female, has a history of pain and functional disability in the right knee due to arthritis and has failed non-surgical conservative treatments for greater than 12 weeks to includeNSAID's and/or analgesics, corticosteriod injections, and activity modification.  Onset of symptoms was gradual, starting 5 years ago with gradually worsening course since that time. The patient noted no past surgery on the right knee(s).  Patient currently rates pain in the right knee(s) at 8 out of 10 with activity. Patient has night pain, worsening of pain with activity and weight bearing, pain that interferes with activities of daily living, pain with passive range of motion, crepitus, and joint swelling.  Patient has evidence of periarticular osteophytes and joint space narrowing by imaging studies. There is no active infection.  Patient Active Problem List   Diagnosis Date Noted   Encounter for weight management 08/12/2022   Generalized abdominal pain 07/14/2022   Senile purpura (HCC) 06/18/2022   Benign essential microscopic hematuria 06/03/2021   Mid back pain on right side 06/02/2021   Herpes simplex infection 07/17/2019   OSA (obstructive sleep apnea) 11/11/2018   Primary localized osteoarthritis of left knee 11/02/2018   Chronic kidney disease (CKD) stage G3b/A1, moderately decreased glomerular filtration rate (GFR) between 30-44 mL/min/1.73 square meter and albuminuria creatinine ratio less than 30 mg/g (HCC) 03/19/2016   Thoracic back pain 02/12/2016   Left knee pain 11/20/2015   Diabetes mellitus type 2 in obese (HCC) 04/16/2015   Hyperlipidemia 07/24/2008   PALPITATIONS 07/05/2007   Major depressive disorder, recurrent episode (HCC) 09/28/2006   Past Medical History:  Diagnosis Date   Allergy    Pollen, dust   Anxiety     Perform exercise to control   Cataract    Will probably have them removed soon.   Chronic kidney disease (CKD) stage G3b/A1, moderately decreased glomerular filtration rate (GFR) between 30-44 mL/min/1.73 square meter and albuminuria creatinine ratio less than 30 mg/g (HCC) 03/19/2016   Depression    Diverticulum    Encounter for weight management 08/12/2022   Herpes simplex infection 07/17/2019   Oral and genital   Hyperlipidemia 07/24/2008   Qualifier: Diagnosis of  By: Dowding RN, Paula     Hypertension    Take meds to control   Left knee pain 11/20/2015   Major depressive disorder, recurrent episode (HCC) 09/28/2006   Qualifier: Diagnosis of  By: Metheney MD, Catherine     OSA (obstructive sleep apnea) 11/11/2018   Study 10/2018 IMPRESSIONS - Mild obstructive sleep apnea occurred during this study (AHI = 14.2/h). - No significant central sleep apnea occurred during this study (CAI = 0.0/h). - Oxygen desaturation was noted during this study (Min O2 = 85%). - Patient snored.   Palpitations 07/05/2007   Qualifier: Diagnosis of  By: Metheney MD, Catherine     Prediabetes 04/16/2015   Primary localized osteoarthritis of left knee 11/02/2018   Sleep apnea    Use CPAP   Thoracic back pain 02/12/2016    Past Surgical History:  Procedure Laterality Date   ABDOMINAL HYSTERECTOMY  2005   Dr. Shawarger   BILATERAL SALPINGOOPHORECTOMY  2005   Dr. Shuwarger   CHOLECYSTECTOMY  2013   CYSTOCELE REPAIR  2005   with bladder tack   JOINT REPLACEMENT  2021   Left knee   SPINE SURGERY  01/2010   Lumbar Laminectomy-Dr. Brown     TOTAL KNEE ARTHROPLASTY Left 11/14/2018   TOTAL KNEE ARTHROPLASTY Left 11/14/2018   Procedure: left TOTAL KNEE ARTHROPLASTY;  Surgeon: Wainer, Robert, MD;  Location: [facility] OR;  Service: Orthopedics;  Laterality: Left;    Current Outpatient Medications  Medication Sig Dispense Refill Last Dose   acetaminophen (TYLENOL) 500 MG tablet Take 1,000 mg by mouth every 6 (six)  hours as needed for moderate pain.      AMBULATORY NON FORMULARY MEDICATION Medication Name: CPAP with nasal pillows and humidifier.  Set to AutoPap 4-20 cm watere pressure setting for 10 days and then please fax his download so that we can set her pressure.  New diagnosis of obstructive sleep apnea with AHI of 14.2.  Was faxed to [facility] 1 Units 0    amLODipine (NORVASC) 5 MG tablet Take 1 tablet (5 mg total) by mouth daily. 90 tablet 1    atorvastatin (LIPITOR) 20 MG tablet Take 1 tablet (20 mg total) by mouth daily. 90 tablet 1    b complex vitamins capsule Take 1 capsule by mouth daily.      BIOTIN PO Take 1 tablet by mouth daily.      Cholecalciferol (VITAMIN D3) 25 MCG (1000 UT) CAPS Take 1,000 Int'l Units by mouth.      finasteride (PROSCAR) 5 MG tablet Take 5 mg by mouth daily.      Glucosamine HCl 1500 MG TABS Take 1 tablet by mouth daily.      Magnesium 400 MG TABS Take 400 mg by mouth daily.      Melatonin 10 MG TABS Take 10 mg by mouth at bedtime.      metFORMIN (GLUCOPHAGE) 500 MG tablet Take 1 tablet (500 mg total) by mouth 2 (two) times daily with a meal. 180 tablet 1    pantoprazole (PROTONIX) 40 MG tablet Take 1 tablet (40 mg total) by mouth daily. (Patient taking differently: Take 40 mg by mouth daily as needed (acid reflux).) 90 tablet 1    PARoxetine (PAXIL) 30 MG tablet Take 1 tablet (30 mg total) by mouth daily. Needs a follow up appointment. 90 tablet 1    Tetrahydrozoline HCl (VISINE OP) Place 1 drop into both eyes daily.      No current facility-administered medications for this visit.   Allergies  Allergen Reactions   Codeine Nausea Only and Other (See Comments)    On an empty stomach only    Social History   Tobacco Use   Smoking status: Former    Packs/day: 1.00    Years: 15.00    Total pack years: 15.00    Types: Cigarettes    Quit date: 12/21/1994    Years since quitting: 28.1   Smokeless tobacco: Never  Substance Use Topics   Alcohol use: Yes     Alcohol/week: 7.0 standard drinks of alcohol    Types: 7 Glasses of wine per week    Comment: 1 alcoholic drink per day    Family History  Problem Relation Age of Onset   Alcohol abuse Father    Depression Father    Peripheral vascular disease Father    Thrombosis Father        deceased from thrombosis of leg.   Anxiety disorder Father    Heart attack Other    Hypertension Mother    Hearing loss Mother    Cancer Other        Brain cancer   Stroke Other    Depression Son    Anxiety   disorder Son    Drug abuse Son    Heart disease Maternal Grandfather    Arthritis Maternal Grandmother    Cancer Maternal Grandmother    Heart disease Paternal Grandfather    Arthritis Paternal Grandmother    Anxiety disorder Sister    Depression Sister    Obesity Sister    Depression Sister    Obesity Sister      Review of Systems  Cardiovascular:  Positive for palpitations.  Genitourinary:  Positive for frequency.  Musculoskeletal:  Positive for arthralgias.  Psychiatric/Behavioral:  The patient is nervous/anxious.   All other systems reviewed and are negative.   Objective:  Physical Exam Constitutional:      General: She is not in acute distress.    Appearance: Normal appearance.  HENT:     Head: Normocephalic and atraumatic.  Eyes:     Extraocular Movements: Extraocular movements intact.     Pupils: Pupils are equal, round, and reactive to light.  Cardiovascular:     Rate and Rhythm: Normal rate and regular rhythm.     Pulses: Normal pulses.     Heart sounds: Normal heart sounds.  Pulmonary:     Effort: Pulmonary effort is normal. No respiratory distress.     Breath sounds: Normal breath sounds. No wheezing.  Abdominal:     General: Abdomen is flat. Bowel sounds are normal. There is no distension.     Palpations: Abdomen is soft.     Tenderness: There is no abdominal tenderness.  Musculoskeletal:     Cervical back: Normal range of motion and neck supple.     Right knee:  Swelling and bony tenderness present. No effusion or erythema. Decreased range of motion. Tenderness present.  Lymphadenopathy:     Cervical: No cervical adenopathy.  Skin:    General: Skin is warm and dry.     Findings: No erythema or rash.  Neurological:     General: No focal deficit present.     Mental Status: She is alert and oriented to person, place, and time.  Psychiatric:        Mood and Affect: Mood normal.        Behavior: Behavior normal.     Vital signs in last 24 hours:   Labs:   Estimated body mass index is 35.99 kg/m as calculated from the following:   Height as of 12/18/22: 5' 6" (1.676 m).   Weight as of 08/12/22: 101.2 kg.   Imaging Review Plain radiographs demonstrate moderate degenerative joint disease of the right knee(s). The overall alignment ismild varus. The bone quality appears to be good for age and reported activity level.      Assessment/Plan:  End stage arthritis, right knee   The patient history, physical examination, clinical judgment of the provider and imaging studies are consistent with end stage degenerative joint disease of the right knee(s) and total knee arthroplasty is deemed medically necessary. The treatment options including medical management, injection therapy arthroscopy and arthroplasty were discussed at length. The risks and benefits of total knee arthroplasty were presented and reviewed. The risks due to aseptic loosening, infection, stiffness, patella tracking problems, thromboembolic complications and other imponderables were discussed. The patient acknowledged the explanation, agreed to proceed with the plan and consent was signed. Patient is being admitted for inpatient treatment for surgery, pain control, PT, OT, prophylactic antibiotics, VTE prophylaxis, progressive ambulation and ADL's and discharge planning. The patient is planning to be discharged home with home health services      Anticipated LOS equal to or greater  than 2 midnights due to - Age 65 and older with one or more of the following:  - Obesity  - Expected need for hospital services (PT, OT, Nursing) required for safe  discharge  - Anticipated need for postoperative skilled nursing care or inpatient rehab  - Active co-morbidities: Diabetes and Cardiac Arrhythmia OR   - Unanticipated findings during/Post Surgery: None  - Patient is a high risk of re-admission due to: None 

## 2023-01-21 NOTE — H&P (View-Only) (Signed)
TOTAL KNEE ADMISSION H&P  Patient is being admitted for right total knee arthroplasty.  Subjective:  Chief Complaint:right knee pain.  HPI: Yvonne Long, 72 y.o. female, has a history of pain and functional disability in the right knee due to arthritis and has failed non-surgical conservative treatments for greater than 12 weeks to includeNSAID's and/or analgesics, corticosteriod injections, and activity modification.  Onset of symptoms was gradual, starting 5 years ago with gradually worsening course since that time. The patient noted no past surgery on the right knee(s).  Patient currently rates pain in the right knee(s) at 8 out of 10 with activity. Patient has night pain, worsening of pain with activity and weight bearing, pain that interferes with activities of daily living, pain with passive range of motion, crepitus, and joint swelling.  Patient has evidence of periarticular osteophytes and joint space narrowing by imaging studies. There is no active infection.  Patient Active Problem List   Diagnosis Date Noted   Encounter for weight management 08/12/2022   Generalized abdominal pain 07/14/2022   Senile purpura ([place]) 06/18/2022   Benign essential microscopic hematuria 06/03/2021   Mid back pain on right side 06/02/2021   Herpes simplex infection 07/17/2019   OSA (obstructive sleep apnea) 11/11/2018   Primary localized osteoarthritis of left knee 11/02/2018   Chronic kidney disease (CKD) stage G3b/A1, moderately decreased glomerular filtration rate (GFR) between 30-44 mL/min/1.73 square meter and albuminuria creatinine ratio less than 30 mg/g ([place]) 03/19/2016   Thoracic back pain 02/12/2016   Left knee pain 11/20/2015   Diabetes mellitus type 2 in obese ([place]) 04/16/2015   Hyperlipidemia 07/24/2008   PALPITATIONS 07/05/2007   Major depressive disorder, recurrent episode ([place]) 09/28/2006   Past Medical History:  Diagnosis Date   Allergy    Pollen, dust   Anxiety     Perform exercise to control   Cataract    Will probably have them removed soon.   Chronic kidney disease (CKD) stage G3b/A1, moderately decreased glomerular filtration rate (GFR) between 30-44 mL/min/1.73 square meter and albuminuria creatinine ratio less than 30 mg/g (HCC) 03/19/2016   Depression    Diverticulum    Encounter for weight management 08/12/2022   Herpes simplex infection 07/17/2019   Oral and genital   Hyperlipidemia 07/24/2008   Qualifier: Diagnosis of  By: Edman Circle RN, Paula     Hypertension    Take meds to control   Left knee pain 11/20/2015   Major depressive disorder, recurrent episode ([place]) 09/28/2006   Qualifier: Diagnosis of  By: Madilyn Fireman MD, Catherine     OSA (obstructive sleep apnea) 11/11/2018   Study 10/2018 IMPRESSIONS - Mild obstructive sleep apnea occurred during this study (AHI = 14.2/h). - No significant central sleep apnea occurred during this study (CAI = 0.0/h). - Oxygen desaturation was noted during this study (Min O2 = 85%). - Patient snored.   Palpitations 07/05/2007   Qualifier: Diagnosis of  By: Madilyn Fireman MD, Catherine     Prediabetes 04/16/2015   Primary localized osteoarthritis of left knee 11/02/2018   Sleep apnea    Use CPAP   Thoracic back pain 02/12/2016    Past Surgical History:  Procedure Laterality Date   ABDOMINAL HYSTERECTOMY  2005   Dr. Toy Cookey   BILATERAL SALPINGOOPHORECTOMY  2005   Dr. Noralee Stain   CHOLECYSTECTOMY  2013   [place]  2005   with bladder tack   JOINT REPLACEMENT  2021   Left knee   SPINE SURGERY  01/2010   Lumbar Laminectomy-Dr. Owens Shark  TOTAL KNEE ARTHROPLASTY Left 11/14/2018   TOTAL KNEE ARTHROPLASTY Left 11/14/2018   Procedure: left TOTAL KNEE ARTHROPLASTY;  Surgeon: Elsie Saas, MD;  Location: [facility];  Service: Orthopedics;  Laterality: Left;    Current Outpatient Medications  Medication Sig Dispense Refill Last Dose   acetaminophen (TYLENOL) 500 MG tablet Take 1,000 mg by mouth every 6 (six)  hours as needed for moderate pain.      AMBULATORY NON FORMULARY MEDICATION Medication Name: CPAP with nasal pillows and humidifier.  Set to AutoPap 4-20 cm watere pressure setting for 10 days and then please fax his download so that we can set her pressure.  New diagnosis of obstructive sleep apnea with AHI of 14.2.  Was faxed to [facility] 1 Units 0    amLODipine (NORVASC) 5 MG tablet Take 1 tablet (5 mg total) by mouth daily. 90 tablet 1    atorvastatin (LIPITOR) 20 MG tablet Take 1 tablet (20 mg total) by mouth daily. 90 tablet 1    b complex vitamins capsule Take 1 capsule by mouth daily.      BIOTIN PO Take 1 tablet by mouth daily.      Cholecalciferol (VITAMIN D3) 25 MCG (1000 UT) CAPS Take 1,000 Int'l Units by mouth.      finasteride (PROSCAR) 5 MG tablet Take 5 mg by mouth daily.      Glucosamine HCl 1500 MG TABS Take 1 tablet by mouth daily.      Magnesium 400 MG TABS Take 400 mg by mouth daily.      Melatonin 10 MG TABS Take 10 mg by mouth at bedtime.      metFORMIN (GLUCOPHAGE) 500 MG tablet Take 1 tablet (500 mg total) by mouth 2 (two) times daily with a meal. 180 tablet 1    pantoprazole (PROTONIX) 40 MG tablet Take 1 tablet (40 mg total) by mouth daily. (Patient taking differently: Take 40 mg by mouth daily as needed (acid reflux).) 90 tablet 1    PARoxetine (PAXIL) 30 MG tablet Take 1 tablet (30 mg total) by mouth daily. Needs a follow up appointment. 90 tablet 1    Tetrahydrozoline HCl (VISINE OP) Place 1 drop into both eyes daily.      No current facility-administered medications for this visit.   Allergies  Allergen Reactions   Codeine Nausea Only and Other (See Comments)    On an empty stomach only    Social History   Tobacco Use   Smoking status: Former    Packs/day: 1.00    Years: 15.00    Total pack years: 15.00    Types: Cigarettes    Quit date: 12/21/1994    Years since quitting: 28.1   Smokeless tobacco: Never  Substance Use Topics   Alcohol use: Yes     Alcohol/week: 7.0 standard drinks of alcohol    Types: 7 Glasses of wine per week    Comment: 1 alcoholic drink per day    Family History  Problem Relation Age of Onset   Alcohol abuse Father    Depression Father    Peripheral vascular disease Father    Thrombosis Father        deceased from thrombosis of leg.   Anxiety disorder Father    Heart attack Other    Hypertension Mother    Hearing loss Mother    Cancer Other        Brain cancer   Stroke Other    Depression Son    Anxiety  disorder Son    Drug abuse Son    Heart disease Maternal Grandfather    Arthritis Maternal Grandmother    Cancer Maternal Grandmother    Heart disease Paternal Grandfather    Arthritis Paternal Grandmother    Anxiety disorder Sister    Depression Sister    Obesity Sister    Depression Sister    Obesity Sister      Review of Systems  Cardiovascular:  Positive for palpitations.  Genitourinary:  Positive for frequency.  Musculoskeletal:  Positive for arthralgias.  Psychiatric/Behavioral:  The patient is nervous/anxious.   All other systems reviewed and are negative.   Objective:  Physical Exam Constitutional:      General: She is not in acute distress.    Appearance: Normal appearance.  HENT:     Head: Normocephalic and atraumatic.  Eyes:     Extraocular Movements: Extraocular movements intact.     Pupils: Pupils are equal, round, and reactive to light.  Cardiovascular:     Rate and Rhythm: Normal rate and regular rhythm.     Pulses: Normal pulses.     Heart sounds: Normal heart sounds.  Pulmonary:     Effort: Pulmonary effort is normal. No respiratory distress.     Breath sounds: Normal breath sounds. No wheezing.  Abdominal:     General: Abdomen is flat. Bowel sounds are normal. There is no distension.     Palpations: Abdomen is soft.     Tenderness: There is no abdominal tenderness.  Musculoskeletal:     Cervical back: Normal range of motion and neck supple.     Right knee:  Swelling and bony tenderness present. No effusion or erythema. Decreased range of motion. Tenderness present.  Lymphadenopathy:     Cervical: No cervical adenopathy.  Skin:    General: Skin is warm and dry.     Findings: No erythema or rash.  Neurological:     General: No focal deficit present.     Mental Status: She is alert and oriented to person, place, and time.  Psychiatric:        Mood and Affect: Mood normal.        Behavior: Behavior normal.     Vital signs in last 24 hours:   Labs:   Estimated body mass index is 35.99 kg/m as calculated from the following:   Height as of 12/18/22: 5' 6"$  (1.676 m).   Weight as of 08/12/22: 101.2 kg.   Imaging Review Plain radiographs demonstrate moderate degenerative joint disease of the right knee(s). The overall alignment ismild varus. The bone quality appears to be good for age and reported activity level.      Assessment/Plan:  End stage arthritis, right knee   The patient history, physical examination, clinical judgment of the provider and imaging studies are consistent with end stage degenerative joint disease of the right knee(s) and total knee arthroplasty is deemed medically necessary. The treatment options including medical management, injection therapy arthroscopy and arthroplasty were discussed at length. The risks and benefits of total knee arthroplasty were presented and reviewed. The risks due to aseptic loosening, infection, stiffness, patella tracking problems, thromboembolic complications and other imponderables were discussed. The patient acknowledged the explanation, agreed to proceed with the plan and consent was signed. Patient is being admitted for inpatient treatment for surgery, pain control, PT, OT, prophylactic antibiotics, VTE prophylaxis, progressive ambulation and ADL's and discharge planning. The patient is planning to be discharged home with home health services  Anticipated LOS equal to or greater  than 2 midnights due to - Age 27 and older with one or more of the following:  - Obesity  - Expected need for hospital services (PT, OT, Nursing) required for safe  discharge  - Anticipated need for postoperative skilled nursing care or inpatient rehab  - Active co-morbidities: Diabetes and Cardiac Arrhythmia OR   - Unanticipated findings during/Post Surgery: None  - Patient is a high risk of re-admission due to: None

## 2023-01-27 NOTE — Care Plan (Signed)
Ortho Bundle Case Management Note  Patient Details  Name: Yvonne Long MRN: [id number] Date of Birth: [birth year]       patient seen in the office for H&P she will discharge to home with family to assist. has Rolling walker. CPM ordered. OPPT set up with [facility]. Discharge instructions discussed and questions answered. CM spoke with her on the phone to confirm no further questions.  Patient and MD in agreement with plan. Choice offered             DME Arranged:  [facility] DME Agency:  [facility]  HH Arranged:    [place] Agency:     Additional Comments: Please contact me with any questions of if this plan should need to change.  Ladell Heads,  [place]  [phone] 01/27/2023, 9:23 AM

## 2023-01-31 ENCOUNTER — Encounter: Payer: Self-pay | Admitting: Family Medicine

## 2023-02-02 MED ORDER — TRANEXAMIC ACID 1000 MG/10ML IV SOLN
2000.0000 mg | INTRAVENOUS | Status: DC
  Filled 2023-02-02: qty 20

## 2023-02-03 ENCOUNTER — Ambulatory Visit (HOSPITAL_COMMUNITY): Payer: Medicare Other | Admitting: Physician Assistant

## 2023-02-03 ENCOUNTER — Ambulatory Visit (HOSPITAL_BASED_OUTPATIENT_CLINIC_OR_DEPARTMENT_OTHER): Payer: Medicare Other | Admitting: Registered Nurse

## 2023-02-03 ENCOUNTER — Encounter (HOSPITAL_COMMUNITY): Payer: Self-pay | Admitting: Orthopedic Surgery

## 2023-02-03 ENCOUNTER — Other Ambulatory Visit: Payer: Self-pay

## 2023-02-03 ENCOUNTER — Observation Stay (HOSPITAL_COMMUNITY): Payer: Medicare Other

## 2023-02-03 ENCOUNTER — Encounter (HOSPITAL_COMMUNITY)
Admission: RE | Disposition: A | Discharge status: Home / Self Care | Payer: Self-pay | Source: Ambulatory Visit | Attending: Orthopedic Surgery

## 2023-02-03 ENCOUNTER — Observation Stay (HOSPITAL_COMMUNITY)
Admission: RE | Admit: 2023-02-03 | Discharge: 2023-02-04 | Disposition: A | Discharge status: Home / Self Care | Payer: Medicare Other | Source: Ambulatory Visit | Attending: Orthopedic Surgery | Admitting: Orthopedic Surgery

## 2023-02-03 DIAGNOSIS — N1832 Chronic kidney disease, stage 3b: Secondary | ICD-10-CM | POA: Insufficient documentation

## 2023-02-03 DIAGNOSIS — E1169 Type 2 diabetes mellitus with other specified complication: Secondary | ICD-10-CM

## 2023-02-03 DIAGNOSIS — I10 Essential (primary) hypertension: Secondary | ICD-10-CM | POA: Diagnosis not present

## 2023-02-03 DIAGNOSIS — Z471 Aftercare following joint replacement surgery: Secondary | ICD-10-CM | POA: Diagnosis not present

## 2023-02-03 DIAGNOSIS — G8918 Other acute postprocedural pain: Secondary | ICD-10-CM | POA: Diagnosis not present

## 2023-02-03 DIAGNOSIS — E669 Obesity, unspecified: Secondary | ICD-10-CM

## 2023-02-03 DIAGNOSIS — Z96651 Presence of right artificial knee joint: Secondary | ICD-10-CM | POA: Diagnosis not present

## 2023-02-03 DIAGNOSIS — Z87891 Personal history of nicotine dependence: Secondary | ICD-10-CM | POA: Diagnosis not present

## 2023-02-03 DIAGNOSIS — I129 Hypertensive chronic kidney disease with stage 1 through stage 4 chronic kidney disease, or unspecified chronic kidney disease: Secondary | ICD-10-CM | POA: Insufficient documentation

## 2023-02-03 DIAGNOSIS — M1711 Unilateral primary osteoarthritis, right knee: Secondary | ICD-10-CM

## 2023-02-03 DIAGNOSIS — E119 Type 2 diabetes mellitus without complications: Secondary | ICD-10-CM | POA: Diagnosis not present

## 2023-02-03 DIAGNOSIS — Z7984 Long term (current) use of oral hypoglycemic drugs: Secondary | ICD-10-CM | POA: Insufficient documentation

## 2023-02-03 DIAGNOSIS — E1122 Type 2 diabetes mellitus with diabetic chronic kidney disease: Secondary | ICD-10-CM | POA: Insufficient documentation

## 2023-02-03 DIAGNOSIS — Z96652 Presence of left artificial knee joint: Secondary | ICD-10-CM | POA: Diagnosis not present

## 2023-02-03 DIAGNOSIS — Z01818 Encounter for other preprocedural examination: Secondary | ICD-10-CM

## 2023-02-03 DIAGNOSIS — Z79899 Other long term (current) drug therapy: Secondary | ICD-10-CM | POA: Diagnosis not present

## 2023-02-03 HISTORY — PX: TOTAL KNEE ARTHROPLASTY: SHX125

## 2023-02-03 LAB — GLUCOSE, CAPILLARY
Glucose-Capillary: 120 mg/dL — ABNORMAL HIGH (ref 70–99)
Glucose-Capillary: 115 mg/dL — ABNORMAL HIGH (ref 70–99)

## 2023-02-03 SURGERY — ARTHROPLASTY, KNEE, TOTAL
Anesthesia: Spinal | Site: Knee | Laterality: Right

## 2023-02-03 MED ORDER — B COMPLEX-C PO TABS
1.0000 | ORAL_TABLET | Freq: Every day | ORAL | Status: DC
  Administered 2023-02-04: 1 via ORAL
  Filled 2023-02-03: qty 1

## 2023-02-03 MED ORDER — HYDROMORPHONE HCL 1 MG/ML IJ SOLN: 0.5000 mg | INTRAMUSCULAR | Status: DC | PRN

## 2023-02-03 MED ORDER — CELECOXIB 100 MG PO CAPS: 100.0000 mg | ORAL_CAPSULE | Freq: Two times a day (BID) | ORAL | 0 refills | Status: AC

## 2023-02-03 MED ORDER — DEXAMETHASONE SODIUM PHOSPHATE 10 MG/ML IJ SOLN
INTRAMUSCULAR | Status: AC
  Filled 2023-02-03: qty 1

## 2023-02-03 MED ORDER — METHOCARBAMOL 500 MG PO TABS: 500.0000 mg | ORAL_TABLET | Freq: Three times a day (TID) | ORAL | 0 refills | Status: AC | PRN

## 2023-02-03 MED ORDER — ONDANSETRON HCL 4 MG PO TABS: 4.0000 mg | ORAL_TABLET | Freq: Four times a day (QID) | ORAL | Status: DC | PRN

## 2023-02-03 MED ORDER — MIDAZOLAM HCL 2 MG/2ML IJ SOLN
1.0000 mg | Freq: Once | INTRAMUSCULAR | Status: AC
  Administered 2023-02-03: 1 mg via INTRAVENOUS
  Filled 2023-02-03: qty 2

## 2023-02-03 MED ORDER — SODIUM CHLORIDE (PF) 0.9 % IJ SOLN
INTRAMUSCULAR | Status: AC
  Filled 2023-02-03: qty 10

## 2023-02-03 MED ORDER — MENTHOL 3 MG MT LOZG
1.0000 | LOZENGE | OROMUCOSAL | Status: DC | PRN
  Administered 2023-02-03: 3 mg via ORAL
  Filled 2023-02-03: qty 9

## 2023-02-03 MED ORDER — AMLODIPINE BESYLATE 5 MG PO TABS
5.0000 mg | ORAL_TABLET | Freq: Every day | ORAL | Status: DC
  Administered 2023-02-04: 5 mg via ORAL
  Filled 2023-02-03: qty 1

## 2023-02-03 MED ORDER — DEXAMETHASONE SODIUM PHOSPHATE 10 MG/ML IJ SOLN
INTRAMUSCULAR | Status: DC | PRN
  Administered 2023-02-03: 8 mg via INTRAVENOUS

## 2023-02-03 MED ORDER — OXYCODONE HCL 5 MG PO TABS
ORAL_TABLET | ORAL | Status: AC
  Filled 2023-02-03: qty 1

## 2023-02-03 MED ORDER — PANTOPRAZOLE SODIUM 40 MG PO TBEC: 40.0000 mg | DELAYED_RELEASE_TABLET | Freq: Every day | ORAL | Status: DC

## 2023-02-03 MED ORDER — PROPOFOL 1000 MG/100ML IV EMUL
INTRAVENOUS | Status: AC
  Filled 2023-02-03: qty 100

## 2023-02-03 MED ORDER — PHENYLEPHRINE HCL-NACL 20-0.9 MG/250ML-% IV SOLN
INTRAVENOUS | Status: DC | PRN
  Administered 2023-02-03: 20 ug/min via INTRAVENOUS

## 2023-02-03 MED ORDER — DIPHENHYDRAMINE HCL 12.5 MG/5ML PO ELIX: 12.5000 mg | ORAL_SOLUTION | ORAL | Status: DC | PRN

## 2023-02-03 MED ORDER — ASPIRIN 81 MG PO CHEW
81.0000 mg | CHEWABLE_TABLET | Freq: Two times a day (BID) | ORAL | Status: DC
  Administered 2023-02-03 – 2023-02-04 (×2): 81 mg via ORAL
  Filled 2023-02-03 (×2): qty 1

## 2023-02-03 MED ORDER — CEFAZOLIN SODIUM-DEXTROSE 2-4 GM/100ML-% IV SOLN
2.0000 g | INTRAVENOUS | Status: AC
  Administered 2023-02-03: 2 g via INTRAVENOUS
  Filled 2023-02-03: qty 100

## 2023-02-03 MED ORDER — PHENYLEPHRINE 80 MCG/ML (10ML) SYRINGE FOR IV PUSH (FOR BLOOD PRESSURE SUPPORT)
PREFILLED_SYRINGE | INTRAVENOUS | Status: DC | PRN
  Administered 2023-02-03: 20 ug via INTRAVENOUS

## 2023-02-03 MED ORDER — BUPIVACAINE LIPOSOME 1.3 % IJ SUSP
INTRAMUSCULAR | Status: AC
  Filled 2023-02-03: qty 20

## 2023-02-03 MED ORDER — METHOCARBAMOL 500 MG IVPB - SIMPLE MED
INTRAVENOUS | Status: AC
  Filled 2023-02-03: qty 55

## 2023-02-03 MED ORDER — ONDANSETRON HCL 4 MG/2ML IJ SOLN: 4.0000 mg | Freq: Once | INTRAMUSCULAR | Status: DC | PRN

## 2023-02-03 MED ORDER — ACETAMINOPHEN 325 MG PO TABS: 325.0000 mg | ORAL_TABLET | Freq: Four times a day (QID) | ORAL | Status: DC | PRN

## 2023-02-03 MED ORDER — PROPOFOL 500 MG/50ML IV EMUL
INTRAVENOUS | Status: DC | PRN
  Administered 2023-02-03: 40 ug/kg/min via INTRAVENOUS

## 2023-02-03 MED ORDER — ONDANSETRON HCL 4 MG/2ML IJ SOLN
INTRAMUSCULAR | Status: AC
  Filled 2023-02-03: qty 2

## 2023-02-03 MED ORDER — TRANEXAMIC ACID-NACL 1000-0.7 MG/100ML-% IV SOLN
1000.0000 mg | INTRAVENOUS | Status: AC
  Administered 2023-02-03: 1000 mg via INTRAVENOUS
  Filled 2023-02-03: qty 100

## 2023-02-03 MED ORDER — PROPOFOL 10 MG/ML IV BOLUS
INTRAVENOUS | Status: AC
  Filled 2023-02-03: qty 20

## 2023-02-03 MED ORDER — LACTATED RINGERS IV SOLN: INTRAVENOUS | Status: DC

## 2023-02-03 MED ORDER — TETRAHYDROZOLINE HCL 0.05 % OP SOLN
1.0000 [drp] | Freq: Two times a day (BID) | OPHTHALMIC | Status: DC
  Administered 2023-02-04: 1 [drp] via OPHTHALMIC
  Filled 2023-02-03: qty 15

## 2023-02-03 MED ORDER — CEFAZOLIN SODIUM-DEXTROSE 2-4 GM/100ML-% IV SOLN
2.0000 g | Freq: Four times a day (QID) | INTRAVENOUS | Status: AC
  Administered 2023-02-03 (×2): 2 g via INTRAVENOUS
  Filled 2023-02-03 (×2): qty 100

## 2023-02-03 MED ORDER — ZOLPIDEM TARTRATE 5 MG PO TABS: 5.0000 mg | ORAL_TABLET | Freq: Every evening | ORAL | Status: DC | PRN

## 2023-02-03 MED ORDER — METHOCARBAMOL 500 MG PO TABS
500.0000 mg | ORAL_TABLET | Freq: Four times a day (QID) | ORAL | Status: DC | PRN
  Administered 2023-02-04: 500 mg via ORAL
  Filled 2023-02-03: qty 1

## 2023-02-03 MED ORDER — MAGNESIUM OXIDE -MG SUPPLEMENT 400 (240 MG) MG PO TABS
400.0000 mg | ORAL_TABLET | Freq: Every day | ORAL | Status: DC
  Filled 2023-02-03: qty 1

## 2023-02-03 MED ORDER — ACETAMINOPHEN 500 MG PO TABS
1000.0000 mg | ORAL_TABLET | Freq: Four times a day (QID) | ORAL | Status: DC
  Administered 2023-02-03 (×2): 1000 mg via ORAL
  Filled 2023-02-03 (×4): qty 2

## 2023-02-03 MED ORDER — BUPIVACAINE LIPOSOME 1.3 % IJ SUSP: 20.0000 mL | Freq: Once | INTRAMUSCULAR | Status: DC

## 2023-02-03 MED ORDER — ISOPROPYL ALCOHOL 70 % SOLN
Status: DC | PRN
  Administered 2023-02-03: 1 via TOPICAL

## 2023-02-03 MED ORDER — OXYCODONE HCL 5 MG PO TABS: 5.0000 mg | ORAL_TABLET | ORAL | Status: DC | PRN

## 2023-02-03 MED ORDER — DOCUSATE SODIUM 100 MG PO CAPS
100.0000 mg | ORAL_CAPSULE | Freq: Two times a day (BID) | ORAL | Status: DC
  Administered 2023-02-03 – 2023-02-04 (×3): 100 mg via ORAL
  Filled 2023-02-03 (×3): qty 1

## 2023-02-03 MED ORDER — POVIDONE-IODINE 10 % EX SWAB
2.0000 | Freq: Once | CUTANEOUS | Status: AC
  Administered 2023-02-03: 2 via TOPICAL

## 2023-02-03 MED ORDER — ACETAMINOPHEN 500 MG PO TABS
1000.0000 mg | ORAL_TABLET | Freq: Once | ORAL | Status: AC
  Administered 2023-02-03: 1000 mg via ORAL
  Filled 2023-02-03: qty 2

## 2023-02-03 MED ORDER — ATORVASTATIN CALCIUM 20 MG PO TABS
20.0000 mg | ORAL_TABLET | Freq: Every day | ORAL | Status: DC
  Administered 2023-02-04: 20 mg via ORAL
  Filled 2023-02-03: qty 1

## 2023-02-03 MED ORDER — OXYCODONE HCL 5 MG PO TABS
5.0000 mg | ORAL_TABLET | Freq: Once | ORAL | Status: AC | PRN
  Administered 2023-02-03: 5 mg via ORAL

## 2023-02-03 MED ORDER — SODIUM CHLORIDE (PF) 0.9 % IJ SOLN
INTRAMUSCULAR | Status: AC
  Filled 2023-02-03: qty 50

## 2023-02-03 MED ORDER — ORAL CARE MOUTH RINSE: 15.0000 mL | Freq: Once | OROMUCOSAL | Status: AC

## 2023-02-03 MED ORDER — 0.9 % SODIUM CHLORIDE (POUR BTL) OPTIME
TOPICAL | Status: DC | PRN
  Administered 2023-02-03: 1000 mL

## 2023-02-03 MED ORDER — ACETAMINOPHEN 500 MG PO TABS: 1000.0000 mg | ORAL_TABLET | Freq: Three times a day (TID) | ORAL | 0 refills | Status: DC | PRN

## 2023-02-03 MED ORDER — METFORMIN HCL 500 MG PO TABS
500.0000 mg | ORAL_TABLET | Freq: Two times a day (BID) | ORAL | Status: DC
  Administered 2023-02-03 – 2023-02-04 (×2): 500 mg via ORAL
  Filled 2023-02-03 (×2): qty 1

## 2023-02-03 MED ORDER — GLUCOSAMINE HCL 1500 MG PO TABS: 1.0000 | ORAL_TABLET | Freq: Every day | ORAL | Status: DC

## 2023-02-03 MED ORDER — BUPIVACAINE LIPOSOME 1.3 % IJ SUSP
INTRAMUSCULAR | Status: DC | PRN
  Administered 2023-02-03: 20 mL

## 2023-02-03 MED ORDER — KETOROLAC TROMETHAMINE 15 MG/ML IJ SOLN
7.5000 mg | Freq: Four times a day (QID) | INTRAMUSCULAR | Status: AC
  Administered 2023-02-03 – 2023-02-04 (×4): 7.5 mg via INTRAVENOUS
  Filled 2023-02-03 (×4): qty 1

## 2023-02-03 MED ORDER — OXYCODONE HCL 5 MG/5ML PO SOLN: 5.0000 mg | Freq: Once | ORAL | Status: AC | PRN

## 2023-02-03 MED ORDER — PHENOL 1.4 % MT LIQD: 1.0000 | OROMUCOSAL | Status: DC | PRN

## 2023-02-03 MED ORDER — SODIUM CHLORIDE (PF) 0.9 % IJ SOLN
INTRAMUSCULAR | Status: DC | PRN
  Administered 2023-02-03: 60 mL

## 2023-02-03 MED ORDER — ONDANSETRON HCL 4 MG/2ML IJ SOLN: 4.0000 mg | Freq: Four times a day (QID) | INTRAMUSCULAR | Status: DC | PRN

## 2023-02-03 MED ORDER — VITAMIN D 25 MCG (1000 UNIT) PO TABS
1000.0000 [IU] | ORAL_TABLET | Freq: Every day | ORAL | Status: DC
  Administered 2023-02-04: 1000 [IU] via ORAL
  Filled 2023-02-03: qty 1

## 2023-02-03 MED ORDER — BUPIVACAINE IN DEXTROSE 0.75-8.25 % IT SOLN
INTRATHECAL | Status: DC | PRN
  Administered 2023-02-03: 1.6 mL via INTRATHECAL

## 2023-02-03 MED ORDER — WATER FOR IRRIGATION, STERILE IR SOLN
Status: DC | PRN
  Administered 2023-02-03: 2000 mL

## 2023-02-03 MED ORDER — ONDANSETRON HCL 4 MG PO TABS: 4.0000 mg | ORAL_TABLET | Freq: Three times a day (TID) | ORAL | 0 refills | Status: AC | PRN

## 2023-02-03 MED ORDER — POLYETHYLENE GLYCOL 3350 17 G PO PACK: 17.0000 g | PACK | Freq: Every day | ORAL | Status: DC | PRN

## 2023-02-03 MED ORDER — SODIUM CHLORIDE 0.9 % IR SOLN
Status: DC | PRN
  Administered 2023-02-03: 3000 mL

## 2023-02-03 MED ORDER — FENTANYL CITRATE PF 50 MCG/ML IJ SOSY
50.0000 ug | PREFILLED_SYRINGE | Freq: Once | INTRAMUSCULAR | Status: AC
  Administered 2023-02-03: 50 ug via INTRAVENOUS
  Filled 2023-02-03: qty 2

## 2023-02-03 MED ORDER — ONDANSETRON HCL 4 MG/2ML IJ SOLN
INTRAMUSCULAR | Status: DC | PRN
  Administered 2023-02-03: 4 mg via INTRAVENOUS

## 2023-02-03 MED ORDER — HYDROCODONE-ACETAMINOPHEN 5-325 MG PO TABS
1.0000 | ORAL_TABLET | ORAL | Status: DC | PRN
  Administered 2023-02-03: 2 via ORAL
  Administered 2023-02-03: 1 via ORAL
  Administered 2023-02-04: 2 via ORAL
  Filled 2023-02-03 (×2): qty 2
  Filled 2023-02-03: qty 1
  Filled 2023-02-03: qty 2

## 2023-02-03 MED ORDER — ASPIRIN 81 MG PO TBEC: 81.0000 mg | DELAYED_RELEASE_TABLET | Freq: Two times a day (BID) | ORAL | 0 refills | Status: AC

## 2023-02-03 MED ORDER — SODIUM CHLORIDE 0.9 % IV SOLN: INTRAVENOUS | Status: DC

## 2023-02-03 MED ORDER — PANTOPRAZOLE SODIUM 40 MG PO TBEC
40.0000 mg | DELAYED_RELEASE_TABLET | Freq: Every day | ORAL | Status: DC
  Administered 2023-02-04: 40 mg via ORAL
  Filled 2023-02-03: qty 1

## 2023-02-03 MED ORDER — CHLORHEXIDINE GLUCONATE 0.12 % MT SOLN
15.0000 mL | Freq: Once | OROMUCOSAL | Status: AC
  Administered 2023-02-03: 15 mL via OROMUCOSAL

## 2023-02-03 MED ORDER — PAROXETINE HCL 10 MG PO TABS
30.0000 mg | ORAL_TABLET | Freq: Every day | ORAL | Status: DC
  Administered 2023-02-04: 30 mg via ORAL
  Filled 2023-02-03: qty 3

## 2023-02-03 MED ORDER — HYDROCODONE-ACETAMINOPHEN 5-325 MG PO TABS: 1.0000 | ORAL_TABLET | ORAL | 0 refills | Status: AC | PRN

## 2023-02-03 MED ORDER — HYDROMORPHONE HCL 1 MG/ML IJ SOLN
INTRAMUSCULAR | Status: AC
  Filled 2023-02-03: qty 1

## 2023-02-03 MED ORDER — MELATONIN 5 MG PO TABS
10.0000 mg | ORAL_TABLET | Freq: Every day | ORAL | Status: DC
  Administered 2023-02-03: 10 mg via ORAL
  Filled 2023-02-03: qty 2

## 2023-02-03 MED ORDER — HYDROMORPHONE HCL 1 MG/ML IJ SOLN
0.2500 mg | INTRAMUSCULAR | Status: DC | PRN
  Administered 2023-02-03: 0.5 mg via INTRAVENOUS

## 2023-02-03 MED ORDER — ROPIVACAINE HCL 7.5 MG/ML IJ SOLN
INTRAMUSCULAR | Status: DC | PRN
  Administered 2023-02-03: 20 mL via PERINEURAL

## 2023-02-03 MED ORDER — METHOCARBAMOL 500 MG IVPB - SIMPLE MED
500.0000 mg | Freq: Four times a day (QID) | INTRAVENOUS | Status: DC | PRN
  Administered 2023-02-03: 500 mg via INTRAVENOUS

## 2023-02-03 SURGICAL SUPPLY — 66 items
ADH SKN CLS APL DERMABOND .7 (GAUZE/BANDAGES/DRESSINGS) ×2
APL PRP STRL LF DISP 70% ISPRP (MISCELLANEOUS) ×2
BAG COUNTER SPONGE SURGICOUNT (BAG) IMPLANT
BAG SPNG CNTER NS LX DISP (BAG) ×1
BLADE SAG 18X100X1.27 (BLADE) ×1 IMPLANT
BLADE SAW SAG 35X64 .89 (BLADE) ×1 IMPLANT
BNDG CMPR 5X3 CHSV STRCH STRL (GAUZE/BANDAGES/DRESSINGS) ×1
BNDG CMPR MED 10X6 ELC LF (GAUZE/BANDAGES/DRESSINGS) ×1
BNDG COHESIVE 3X5 TAN ST LF (GAUZE/BANDAGES/DRESSINGS) ×1 IMPLANT
BNDG ELASTIC 6X10 VLCR STRL LF (GAUZE/BANDAGES/DRESSINGS) ×1 IMPLANT
BOWL SMART MIX CTS (DISPOSABLE) ×1 IMPLANT
BSPLAT TIB 5D D CMNT STM RT (Knees) ×1 IMPLANT
CEMENT BONE R 1X40 (Cement) IMPLANT
CEMENT BONE REFOBACIN R1X40 US (Cement) IMPLANT
CHLORAPREP W/TINT 26 (MISCELLANEOUS) ×2 IMPLANT
COMP FEM CMT CR PERS SZ8 RT (Joint) ×1 IMPLANT
COMPONENT FEM CMT CR PRS SZ8RT (Joint) IMPLANT
COVER SURGICAL LIGHT HANDLE (MISCELLANEOUS) ×1 IMPLANT
CUFF TOURN SGL QUICK 34 (TOURNIQUET CUFF) ×1
CUFF TRNQT CYL 34X4.125X (TOURNIQUET CUFF) ×1 IMPLANT
DERMABOND ADVANCED .7 DNX12 (GAUZE/BANDAGES/DRESSINGS) ×1 IMPLANT
DRAPE INCISE IOBAN 85X60 (DRAPES) ×1 IMPLANT
DRAPE SHEET LG 3/4 BI-LAMINATE (DRAPES) ×1 IMPLANT
DRAPE U-SHAPE 47X51 STRL (DRAPES) ×1 IMPLANT
DRESSING AQUACEL AG SP 3.5X10 (GAUZE/BANDAGES/DRESSINGS) ×1 IMPLANT
DRSG AQUACEL AG ADV 3.5X10 (GAUZE/BANDAGES/DRESSINGS) IMPLANT
DRSG AQUACEL AG SP 3.5X10 (GAUZE/BANDAGES/DRESSINGS) ×1
ELECT REM PT RETURN 15FT ADLT (MISCELLANEOUS) ×1 IMPLANT
GAUZE SPONGE 4X4 12PLY STRL (GAUZE/BANDAGES/DRESSINGS) ×1 IMPLANT
GLOVE BIO SURGEON STRL SZ 6.5 (GLOVE) ×2 IMPLANT
GLOVE BIOGEL PI IND STRL 6.5 (GLOVE) ×1 IMPLANT
GLOVE BIOGEL PI IND STRL 8 (GLOVE) ×1 IMPLANT
GLOVE SURG ORTHO 8.0 STRL STRW (GLOVE) ×2 IMPLANT
GOWN STRL REUS W/ TWL XL LVL3 (GOWN DISPOSABLE) ×2 IMPLANT
GOWN STRL REUS W/TWL XL LVL3 (GOWN DISPOSABLE) ×2
HANDPIECE INTERPULSE COAX TIP (DISPOSABLE) ×1
HDLS TROCR DRIL PIN KNEE 75 (PIN) ×1
HOLDER FOLEY CATH W/STRAP (MISCELLANEOUS) ×1 IMPLANT
HOOD PEEL AWAY T7 (MISCELLANEOUS) ×3 IMPLANT
INSERT TIB ASF PS CD/8-9 11 RT (Insert) IMPLANT
MANIFOLD NEPTUNE II (INSTRUMENTS) ×1 IMPLANT
MARKER SKIN DUAL TIP RULER LAB (MISCELLANEOUS) ×1 IMPLANT
NS IRRIG 1000ML POUR BTL (IV SOLUTION) ×1 IMPLANT
PACK TOTAL KNEE CUSTOM (KITS) ×1 IMPLANT
PIN DRILL HDLS TROCAR 75 4PK (PIN) IMPLANT
PROTECTOR NERVE ULNAR (MISCELLANEOUS) ×1 IMPLANT
SCREW HEADED 33MM KNEE (MISCELLANEOUS) IMPLANT
SET HNDPC FAN SPRY TIP SCT (DISPOSABLE) ×1 IMPLANT
SOLUTION IRRIG SURGIPHOR (IV SOLUTION) IMPLANT
SPIKE FLUID TRANSFER (MISCELLANEOUS) ×1 IMPLANT
STEM POLY PAT PLY 32M KNEE (Knees) IMPLANT
STEM TIBIA 5 DEG SZ D R KNEE (Knees) IMPLANT
STRIP CLOSURE SKIN 1/2X4 (GAUZE/BANDAGES/DRESSINGS) ×1 IMPLANT
SUT MNCRL AB 3-0 PS2 18 (SUTURE) ×1 IMPLANT
SUT STRATAFIX 0 PDS 27 VIOLET (SUTURE) ×1
SUT STRATAFIX PDO 1 14 VIOLET (SUTURE) ×1
SUT STRATFX PDO 1 14 VIOLET (SUTURE) ×1
SUT VIC AB 2-0 CT2 27 (SUTURE) ×2 IMPLANT
SUTURE STRATFX 0 PDS 27 VIOLET (SUTURE) ×1 IMPLANT
SUTURE STRATFX PDO 1 14 VIOLET (SUTURE) ×1 IMPLANT
SYR 50ML LL SCALE MARK (SYRINGE) ×1 IMPLANT
TIBIA STEM 5 DEG SZ D R KNEE (Knees) ×1 IMPLANT
TRAY FOLEY MTR SLVR 14FR STAT (SET/KITS/TRAYS/PACK) IMPLANT
TUBE SUCTION HIGH CAP CLEAR NV (SUCTIONS) ×1 IMPLANT
UNDERPAD 30X36 HEAVY ABSORB (UNDERPADS AND DIAPERS) ×1 IMPLANT
WRAP KNEE MAXI GEL POST OP (GAUZE/BANDAGES/DRESSINGS) IMPLANT

## 2023-02-03 NOTE — Progress Notes (Signed)
Orthopedic Tech Progress Note Patient Details:  Yvonne Long August 17, 1951 [id number]  Patient ID: Yvonne Long, female   DOB: [birth year], 72 y.o.   MRN: [id number]  Yvonne Long 02/03/2023, 12:22 PM Right bone foam applied in pacu

## 2023-02-03 NOTE — Plan of Care (Signed)
  Problem: Activity: Goal: Ability to avoid complications of mobility impairment will improve Outcome: Progressing Goal: Range of joint motion will improve Outcome: Progressing   Problem: Pain Management: Goal: Pain level will decrease with appropriate interventions Outcome: Progressing   Problem: Safety: Goal: Ability to remain free from injury will improve Outcome: Progressing

## 2023-02-03 NOTE — Discharge Instructions (Signed)

## 2023-02-03 NOTE — Transfer of Care (Signed)
Immediate Anesthesia Transfer of Care Note  Patient: Yvonne Long  Procedure(s) Performed: TOTAL KNEE ARTHROPLASTY (Right: Knee)  Patient Location: PACU  Anesthesia Type:MAC and Spinal  Level of Consciousness: awake, alert , oriented, and patient cooperative  Airway & Oxygen Therapy: Patient Spontanous Breathing and Patient connected to face mask oxygen  Post-op Assessment: Report given to RN and Post -op Vital signs reviewed and stable  Post vital signs: Reviewed and stable  Last Vitals:  Vitals Value Taken Time  BP 117/94 02/03/23 1218  Temp    Pulse 67 02/03/23 1220  Resp 15 02/03/23 1220  SpO2 100 % 02/03/23 1220  Vitals shown include unvalidated device data.  Last Pain:  Vitals:   02/03/23 1003  TempSrc:   PainSc: 0-No pain         Complications: No notable events documented.

## 2023-02-03 NOTE — Anesthesia Procedure Notes (Signed)
Spinal  Start time: 02/03/2023 10:12 AM End time: 02/03/2023 10:17 AM Reason for block: surgical anesthesia Staffing Performed: resident/CRNA  Resident/CRNA: Victoriano Lain, CRNA Performed by: Victoriano Lain, CRNA Authorized by: Myrtie Soman, MD   Preanesthetic Checklist Completed: patient identified, IV checked, site marked, risks and benefits discussed, surgical consent, monitors and equipment checked, pre-op evaluation and timeout performed Spinal Block Patient position: sitting Prep: DuraPrep and site prepped and draped Patient monitoring: heart rate, cardiac monitor, continuous pulse ox and blood pressure Approach: midline Location: L3-4 Injection technique: single-shot Needle Needle type: Pencan  Needle gauge: 24 G Needle length: 10 cm Assessment Sensory level: T4 Events: CSF return Additional Notes Spinal kit expiration date checked and verified. Sterile prep and drape of back. Skin anesthetized with local. One attempt by CRNA. Clear free flowing CSF obtained. - heme. Pt tolerated well. Placed supine after placement

## 2023-02-03 NOTE — Anesthesia Procedure Notes (Signed)
Anesthesia Regional Block: Adductor canal block   Pre-Anesthetic Checklist: , timeout performed,  Correct Patient, Correct Site, Correct Laterality,  Correct Procedure, Correct Position, site marked,  Risks and benefits discussed,  Surgical consent,  Pre-op evaluation,  At surgeon's request and post-op pain management  Laterality: Right  Prep: chloraprep       Needles:  Injection technique: Single-shot  Needle Type: Echogenic Needle     Needle Length: 9cm      Additional Needles:   Procedures:,,,, ultrasound used (permanent image in chart),,    Narrative:  Start time: 02/03/2023 9:58 AM End time: 02/03/2023 10:03 AM Injection made incrementally with aspirations every 5 mL.  Performed by: Personally  Anesthesiologist: Myrtie Soman, MD  Additional Notes: Patient tolerated the procedure well without complications

## 2023-02-03 NOTE — Anesthesia Preprocedure Evaluation (Signed)
Anesthesia Evaluation  Patient identified by MRN, date of birth, ID band Patient awake    Reviewed: Allergy & Precautions, H&P , NPO status , Patient's Chart, lab work & pertinent test results  Airway Mallampati: II  TM Distance: <3 FB Neck ROM: Full    Dental no notable dental hx.    Pulmonary sleep apnea and Continuous Positive Airway Pressure Ventilation , former smoker   Pulmonary exam normal breath sounds clear to auscultation       Cardiovascular hypertension, Normal cardiovascular exam Rhythm:Regular Rate:Normal     Neuro/Psych negative neurological ROS  negative psych ROS   GI/Hepatic negative GI ROS, Neg liver ROS,,,  Endo/Other  diabetes    Renal/GU Renal InsufficiencyRenal disease  negative genitourinary   Musculoskeletal  (+) Arthritis , Osteoarthritis,    Abdominal   Peds negative pediatric ROS (+)  Hematology negative hematology ROS (+)   Anesthesia Other Findings   Reproductive/Obstetrics negative OB ROS                             Anesthesia Physical Anesthesia Plan  ASA: 3  Anesthesia Plan: Spinal   Post-op Pain Management: Regional block*   Induction: Intravenous  PONV Risk Score and Plan: 2 and Ondansetron, Dexamethasone, Propofol infusion and Treatment may vary due to age or medical condition  Airway Management Planned: Simple Face Mask  Additional Equipment:   Intra-op Plan:   Post-operative Plan:   Informed Consent: I have reviewed the patients History and Physical, chart, labs and discussed the procedure including the risks, benefits and alternatives for the proposed anesthesia with the patient or authorized representative who has indicated his/her understanding and acceptance.     Dental advisory given  Plan Discussed with: CRNA and Surgeon  Anesthesia Plan Comments:        Anesthesia Quick Evaluation

## 2023-02-03 NOTE — Anesthesia Procedure Notes (Signed)
Anesthesia Procedure Image    

## 2023-02-03 NOTE — Progress Notes (Signed)
PHARMACIST - PHYSICIAN ORDER COMMUNICATION  CONCERNING: P&T Medication Policy on Herbal Medications  DESCRIPTION:  This patient's order for:  Glucosamine  has been noted.  This product(s) is classified as an "herbal" or natural product. Due to a lack of definitive safety studies or FDA approval, nonstandard manufacturing practices, plus the potential risk of unknown drug-drug interactions while on inpatient medications, the Pharmacy and Therapeutics Committee does not permit the use of "herbal" or natural products of this type within [facility].   ACTION TAKEN: The pharmacy department is unable to verify this order at this time. Please reevaluate patient's clinical condition at discharge and address if the herbal or natural product(s) should be resumed at that time.  Minda Ditto PharmD 02/03/2023, 2:01 PM

## 2023-02-03 NOTE — Op Note (Signed)
DATE OF SURGERY:  02/03/2023 TIME: 11:57 AM  PATIENT NAME:  Yvonne Long   AGE: 72 y.o.    PRE-OPERATIVE DIAGNOSIS:  End-stage right knee osteoarthritis  POST-OPERATIVE DIAGNOSIS:  Same  PROCEDURE:  Right Total Knee Arthroplasty  SURGEON:  Belem Hintze A Nini Cavan, MD   ASSISTANT: Izola Price, RNFA, present and scrubbed throughout the case, critical for assistance with exposure, retraction, instrumentation, and closure.   OPERATIVE IMPLANTS:  Cemented Zimmer persona size 8 narrow femur CR, D tibial baseplate, 11 mm MC polyethylene liner, 32 mm all poly patella Implant Name Type Inv. Item Serial No. Manufacturer Lot No. LRB No. Used Action  TIBIA STEM 5 DEG SZ D R KNEE - [id number] Knees TIBIA STEM 5 DEG SZ D R KNEE  ZIMMER [employer]ON(ORTH,TRAU,BIO,SG) [id number] Right 1 Implanted  COMP FEM CMT CR PERS SZ8 RT - [id number] Joint COMP FEM CMT CR PERS SZ8 RT  ZIMMER [employer]ON(ORTH,TRAU,BIO,SG) [id number] Right 1 Implanted  INSERT TIB ASF PS CD/8-9 11 RT - [id number] Insert INSERT TIB ASF PS CD/8-9 11 RT  ZIMMER [employer]ON(ORTH,TRAU,BIO,SG) [id number] Right 1 Implanted  STEM POLY PAT PLY 24M KNEE - [id number] Knees STEM POLY PAT PLY 24M KNEE  ZIMMER RECON(ORTH,TRAU,BIO,SG) [id number] Right 1 Implanted  CEMENT BONE REFOBACIN R1X40 [place] - [id number] Cement CEMENT BONE REFOBACIN R1X40 [place]  ZIMMER RECON(ORTH,TRAU,BIO,SG) [id number] Right 2 Implanted      PREOPERATIVE INDICATIONS:  Yvonne Long is a 72 y.o. year old female with end stage bone on bone degenerative arthritis of the knee who failed conservative treatment, including injections, antiinflammatories, activity modification, and assistive devices, and had significant impairment of their activities of daily living, and elected for Total Knee Arthroplasty.   The risks, benefits, and alternatives were discussed at length including but not limited to the risks of infection, bleeding, nerve injury, stiffness, blood clots, the need for revision surgery,  cardiopulmonary complications, among others, and they were willing to proceed.  ESTIMATED BLOOD LOSS: 25cc  OPERATIVE DESCRIPTION:   Once adequate anesthesia was induced, preoperative antibiotics, 2 gm of ancef,1 gm of Tranexamic Acid, and 8 mg of Decadron administered, the patient was positioned supine with a right thigh tourniquet placed.  The right lower extremity was prepped and draped in sterile fashion.  A time-  out was performed identifying the patient, planned procedure, and the appropriate extremity.     The leg was  exsanguinated, tourniquet elevated to 250 mmHg.  A midline incision was  made followed by median parapatellar arthrotomy. Anterior horn of the medial meniscus was released and resected. A medial release was performed, the infrapatellar fat pad was resected with care taken to protect the patellar tendon. The suprapatellar fat was removed to exposed the distal anterior femur. The anterior horn of the lateral meniscus and ACL were released.    Following initial  exposure, I first started with the femur  The femoral  canal was opened with a drill, canal was suctioned to try to prevent fat emboli.  An  intramedullary rod was passed set at 5 degrees valgus, 10 mm. The distal femur was resected.  Following this resection, the tibia was  subluxated anteriorly.  Using the extramedullary guide, 10 mm of bone was resected off   the proximal latearl tibia.  We confirmed the gap would be  stable medially and laterally with a size 73m spacer block as well as confirmed that the tibial cut was perpendicular in the coronal plane, checking with an alignment rod.    Once this was done, the posterior  femoral referencing femoral sizer was placed under to the posterior condyles with 3 degrees of external rotational which was parallel to the transepicondylar axis and perpendicular to [employer]. The femur was sized to be a size 8 in the anterior-  posterior dimension. The  anterior, posterior, and   chamfer cuts were made without difficulty nor   notching making certain that I was along the anterior cortex to help  with flexion gap stability. Next a laminar spreader was placed with the knee in flexion and the medial lateral menisci were resected.  5 cc of the Exparel mixture was injected in the medial side of the back of the knee and 3 cc in the lateral side.  1/2 inch curved osteotome was used to resect posterior osteophyte that was then removed with a pituitary rongeur.       At this point, the tibia was sized to be a size D.  The size D tray was  then pinned in position. Trial reduction was now carried with a 8 femur, D tibia, a 10 mm MC insert.  This felt a little loose in both flexion and extensin so upsized to 30m instert. The knee had full extension and was stable to varus valgus stress in extension.  The knee was stable in flexion and the PCL was partially released.   Attention was next directed to the patella.  Precut  measurement was noted to be 23 mm.  I resected down to 14 mm and used a  31mpatellar button to restore patellar height as well as cover the cut surface.     The patella lug holes were drilled and a 3211matella poly trial was placed.    The knee was brought to full extension with good flexion stability with the patella tracking through the trochlea without application of pressure.     Next the femoral component was again assessed and determined to be seated and appropriately lateralized.  The femoral lug holes were drilled.  The femoral component was then removed. Tibial component was again assessed and felt to be seated and appropriately rotated with the medial third of the tubercle. The tibia was then drilled, and keel punched.     Final components were  opened and antibiotic cement was mixed.      Final implants were then  cemented onto cleaned and dried cut surfaces of bone with the knee brought to extension with a 17m66m poly.  The knee was irrigated with  sterile Betadine diluted in saline as well as pulse lavage normal saline. The synovial lining was  then injected a dilute Exparel.      Once the cement had fully cured, excess cement was removed throughout the knee.  I confirmed that I was satisfied with the range of motion and stability, and the final 11mm13mpoly insert was chosen.  It was placed into the knee.         The tourniquet had been let down at 60 minutes.  No significant hemostasis was required.  The medial parapatellar arthrotomy was then reapproximated using #1 Stratafix sutures with the knee  in flexion.  The remaining wound was closed with 0 stratafix, 2-0 Vicryl, and running 3-0 Monocryl. The knee was cleaned, dried, dressed sterilely using Dermabond and   Aquacel dressing.  The patient was then brought to recovery room in stable condition, tolerating the procedure  well. There were no complications.   Post op recs: WB: WBAT Abx: ancef Imaging: PACU xrays  DVT prophylaxis: Aspirin 74m BID x4 weeks Follow up: 2 weeks after surgery [facility]  [address] [place]  [address] Office Phone: [phone] DCharlies Constable MD Orthopaedic Surgery

## 2023-02-03 NOTE — Interval H&P Note (Signed)

## 2023-02-03 NOTE — Anesthesia Procedure Notes (Signed)
Procedure Name: MAC Date/Time: 02/03/2023 10:11 AM  Performed by: Victoriano Lain, CRNAPre-anesthesia Checklist: Patient identified, Emergency Drugs available, Suction available, Patient being monitored and Timeout performed Patient Re-evaluated:Patient Re-evaluated prior to induction Oxygen Delivery Method: Simple face mask Placement Confirmation: positive ETCO2 Dental Injury: Teeth and Oropharynx as per pre-operative assessment

## 2023-02-03 NOTE — Evaluation (Signed)
Physical Therapy Evaluation Patient Details Name: Yvonne Long MRN: [id number] DOB: 10-Apr-1951 Today's Date: 02/03/2023  History of Present Illness  72 yo female presents to therapy s/p R TKA on 12/04/2023 due to failure of conservative measures. Pt PMH includes but is not limited to: senile purpura, LBP, t-spine pain, herpes, OSA,on CPAP CKD IIIb, DM II, HDL, L TKA (2019 and 2021)and  lumbar laminectomy (2011)  Clinical Impression  Yvonne Long is a 72 y.o. female POD 0 s/p R TKA. Patient reports mod I with SPC with mobility at baseline. Patient is now limited by functional impairments (see PT problem list below) and requires min A  for bed mobility and min guard for transfers. Patient was able to ambulate 36 feet with RW and min guard level of assist. Patient instructed in exercise to facilitate ROM and circulation to manage edema. Provided incentive spirometer and with Vcs pt able to achieve 1750 mL. Patient will benefit from continued skilled PT interventions to address impairments and progress towards PLOF. Acute PT will follow to progress mobility and stair training in preparation for safe discharge home.         Recommendations for follow up therapy are one component of a multi-disciplinary discharge planning process, led by the attending physician.  Recommendations may be updated based on patient status, additional functional criteria and insurance authorization.  Follow Up Recommendations Follow physician's recommendations for discharge plan and follow up therapies      Assistance Recommended at Discharge Frequent or constant Supervision/Assistance  Patient can return home with the following  A little help with walking and/or transfers;A little help with bathing/dressing/bathroom;Assist for transportation;Help with stairs or ramp for entrance;Assistance with cooking/housework    Equipment Recommendations Other (comment) (pt reports having DME in home setting)   Recommendations for Other Services       Functional Status Assessment Patient has had a recent decline in their functional status and demonstrates the ability to make significant improvements in function in a reasonable and predictable amount of time.     Precautions / Restrictions Precautions Precautions: Fall Precaution Comments: no pillow under R knee Restrictions Weight Bearing Restrictions: No Other Position/Activity Restrictions: R LE WBAT      Mobility  Bed Mobility Overal bed mobility: Needs Assistance Bed Mobility: Supine to Sit     Supine to sit: Min assist (min a for R LE to EOB)     General bed mobility comments: HOB elevated    Transfers Overall transfer level: Needs assistance Equipment used: Rolling walker (2 wheels) Transfers: Sit to/from Stand Sit to Stand: Min guard           General transfer comment: cues for proper UE and LE placement    Ambulation/Gait Ambulation/Gait assistance: Min guard Gait Distance (Feet): 36 Feet Assistive device: Rolling walker (2 wheels) Gait Pattern/deviations: Step-to pattern, Antalgic, Trunk flexed Gait velocity: decreased        Stairs            Wheelchair Mobility    Modified Rankin (Stroke Patients Only)       Balance Overall balance assessment: Needs assistance, History of Falls (2 falls in the past year) Sitting-balance support: Feet unsupported, No upper extremity supported Sitting balance-Leahy Scale: Fair     Standing balance support: Reliant on assistive device for balance Standing balance-Leahy Scale: Poor  Pertinent Vitals/Pain Pain Assessment Pain Assessment: 0-10 Pain Score: 5  Pain Location: R knee Pain Descriptors / Indicators: Discomfort, Constant, Guarding Pain Intervention(s): Limited activity within patient's tolerance, Monitored during session, Premedicated before session, Repositioned, Ice applied, Patient requesting pain  meds-RN notified, RN gave pain meds during session    Home Living Family/patient expects to be discharged to:: Private residence Living Arrangements: Spouse/significant other Available Help at Discharge: Family Type of Home: House Home Access: Stairs to enter Entrance Stairs-Rails: Left (entering from the garage) Entrance Stairs-Number of Steps: 3   Home Layout: Multi-level;Other (Comment) (B and B on first) Home Equipment: Rolling Walker (2 wheels);Cane - single point;BSC/3in1;Shower seat - built in      Prior Function Prior Level of Function : Independent/Modified Independent             Mobility Comments: amb with SPC in home and community, mod I with ADLs, IADLs and driving       Hand Dominance        Extremity/Trunk Assessment        Lower Extremity Assessment Lower Extremity Assessment: RLE deficits/detail RLE Deficits / Details: ankle DF/PF 5/5, hip flexion 3+/5 RLE Sensation: WNL       Communication   Communication: No difficulties  Cognition Arousal/Alertness: Awake/alert Behavior During Therapy: WFL for tasks assessed/performed Overall Cognitive Status: Within Functional Limits for tasks assessed                                          General Comments      Exercises Total Joint Exercises Ankle Circles/Pumps: AROM, 20 reps, Both Quad Sets: AROM, 5 reps, Right   Assessment/Plan    PT Assessment Patient needs continued PT services  PT Problem List Decreased strength;Decreased range of motion;Decreased activity tolerance;Decreased balance;Decreased mobility;Decreased knowledge of use of DME;Pain       PT Treatment Interventions DME instruction;Gait training;Functional mobility training;Stair training;Therapeutic activities;Therapeutic exercise;Balance training;Neuromuscular re-education;Modalities    PT Goals (Current goals can be found in the Care Plan section)  Acute Rehab PT Goals Patient Stated Goal: not be in pain and  have to sit all of the time PT Goal Formulation: With patient Time For Goal Achievement: 02/17/23 Potential to Achieve Goals: Good    Frequency 7X/week     Co-evaluation               AM-PAC PT "6 Clicks" Mobility  Outcome Measure Help needed turning from your back to your side while in a flat bed without using bedrails?: A Little Help needed moving from lying on your back to sitting on the side of a flat bed without using bedrails?: A Little Help needed moving to and from a bed to a chair (including a wheelchair)?: A Little Help needed standing up from a chair using your arms (e.g., wheelchair or bedside chair)?: A Little Help needed to walk in hospital room?: A Little Help needed climbing 3-5 steps with a railing? : Total 6 Click Score: 16    End of Session Equipment Utilized During Treatment: Gait belt Activity Tolerance: Patient limited by fatigue;No increased pain Patient left: in chair;with call bell/phone within reach;with family/visitor present Nurse Communication: Mobility status;Other (comment) (pain report) PT Visit Diagnosis: Unsteadiness on feet (R26.81);Other abnormalities of gait and mobility (R26.89);Muscle weakness (generalized) (M62.81);History of falling (Z91.81);Pain;Difficulty in walking, not elsewhere classified (R26.2) Pain - Right/Left: Right Pain - part of  body: Knee    Time: [id number] PT Time Calculation (min) (ACUTE ONLY): 46 min   Charges:     PT Treatments $Gait Training: 8-22 mins $Therapeutic Activity: 8-22 mins       Baird Lyons, PT    Adair Patter 02/03/2023, 3:53 PM

## 2023-02-04 ENCOUNTER — Encounter (HOSPITAL_COMMUNITY): Payer: Self-pay | Admitting: Orthopedic Surgery

## 2023-02-04 DIAGNOSIS — Z7984 Long term (current) use of oral hypoglycemic drugs: Secondary | ICD-10-CM | POA: Diagnosis not present

## 2023-02-04 DIAGNOSIS — Z96652 Presence of left artificial knee joint: Secondary | ICD-10-CM | POA: Diagnosis not present

## 2023-02-04 DIAGNOSIS — Z96651 Presence of right artificial knee joint: Secondary | ICD-10-CM | POA: Diagnosis not present

## 2023-02-04 DIAGNOSIS — N1832 Chronic kidney disease, stage 3b: Secondary | ICD-10-CM | POA: Diagnosis not present

## 2023-02-04 DIAGNOSIS — I129 Hypertensive chronic kidney disease with stage 1 through stage 4 chronic kidney disease, or unspecified chronic kidney disease: Secondary | ICD-10-CM | POA: Diagnosis not present

## 2023-02-04 DIAGNOSIS — E1122 Type 2 diabetes mellitus with diabetic chronic kidney disease: Secondary | ICD-10-CM | POA: Diagnosis not present

## 2023-02-04 DIAGNOSIS — M1711 Unilateral primary osteoarthritis, right knee: Secondary | ICD-10-CM | POA: Diagnosis not present

## 2023-02-04 DIAGNOSIS — Z87891 Personal history of nicotine dependence: Secondary | ICD-10-CM | POA: Diagnosis not present

## 2023-02-04 DIAGNOSIS — Z79899 Other long term (current) drug therapy: Secondary | ICD-10-CM | POA: Diagnosis not present

## 2023-02-04 LAB — BASIC METABOLIC PANEL
Anion gap: 10 (ref 5–15)
BUN: 13 mg/dL (ref 8–23)
CO2: 23 mmol/L (ref 22–32)
Calcium: 8.7 mg/dL — ABNORMAL LOW (ref 8.9–10.3)
Chloride: 104 mmol/L (ref 98–111)
Creatinine, Ser: 0.77 mg/dL (ref 0.44–1.00)
GFR, Estimated: >60 mL/min (ref >60)
Glucose, Bld: 153 mg/dL — ABNORMAL HIGH (ref 70–99)
Potassium: 4 mmol/L (ref 3.5–5.1)
Sodium: 137 mmol/L (ref 135–145)

## 2023-02-04 LAB — CBC
HCT: 39.8 % (ref 36.0–46.0)
Hemoglobin: 12.6 g/dL (ref 12.0–15.0)
MCH: 29.2 pg (ref 26.0–34.0)
MCHC: 31.7 g/dL (ref 30.0–36.0)
MCV: 92.1 fL (ref 80.0–100.0)
Platelets: 277 10*3/uL (ref 150–400)
RBC: 4.32 MIL/uL (ref 3.87–5.11)
RDW: 13.7 % (ref 11.5–15.5)
WBC: 12.8 10*3/uL — ABNORMAL HIGH (ref 4.0–10.5)
nRBC: 0 % (ref 0.0–0.2)

## 2023-02-04 MED ORDER — MAGNESIUM OXIDE -MG SUPPLEMENT 400 (240 MG) MG PO TABS
400.0000 mg | ORAL_TABLET | Freq: Every day | ORAL | Status: DC
  Administered 2023-02-04: 400 mg via ORAL
  Filled 2023-02-04: qty 1

## 2023-02-04 NOTE — Progress Notes (Signed)
     Subjective:  Patient reports pain as mild.  Has done great with physical therapy and pain control since surgery.  She is very pleased with her progress so far.  Anticipate discharge home later today.  Objective:   VITALS:   Vitals:   02/03/23 2137 02/03/23 2217 02/04/23 0112 02/04/23 0515  BP: 135/69  (!) 122/56 127/69  Pulse: 78 70 66 72  Resp: '18 16 18 19  '$ Temp: 98.2 F (36.8 C)  97.9 F (36.6 C) 97.7 F (36.5 C)  TempSrc:   Oral   SpO2: 92% 92% 96% 95%  Weight:      Height:        Sensation intact distally Intact pulses distally Dorsiflexion/Plantar flexion intact Incision: dressing C/D/I Compartment soft    Lab Results  Component Value Date   WBC 12.8 (H) 02/04/2023   HGB 12.6 02/04/2023   HCT 39.8 02/04/2023   MCV 92.1 02/04/2023   PLT 277 02/04/2023   BMET    Component Value Date/Time   NA 137 02/04/2023 0333   K 4.0 02/04/2023 0333   CL 104 02/04/2023 0333   CO2 23 02/04/2023 0333   GLUCOSE 153 (H) 02/04/2023 0333   BUN 13 02/04/2023 0333   CREATININE 0.77 02/04/2023 0333   CREATININE 0.94 06/19/2022 0000   CALCIUM 8.7 (L) 02/04/2023 0333   EGFR 66 06/19/2022 0000   GFRNONAA >60 02/04/2023 0333   GFRNONAA 62 11/28/2020 0000      Xray: Total arthroplasty components in good position no adverse features  Assessment/Plan: 1 Day Post-Op   Principal Problem:   Localized osteoarthritis of right knee  Status post right total knee arthroplasty 02/03/2023  Post op recs: WB: WBAT Abx: ancef Imaging: PACU xrays DVT prophylaxis: Aspirin '81mg'$  BID x4 weeks Follow up: 2 weeks after surgery for a wound check with Dr. Zachery Dakins at [place].  Address: [address] [place], [place], [place] [address]  Office Phone: [phone]    Willaim Sheng 02/04/2023, 8:30 AM   Charlies Constable, MD  Contact information:   [phone] 7am-5pm epic message Dr. Zachery Dakins, or call office for patient follow up: [phone] After  hours and holidays please check [email or web address] for group call information for [facility]

## 2023-02-04 NOTE — Plan of Care (Signed)
Patient discharged home via private vehicle with husband. Ivan Anchors, RN 02/04/23 11:18 AM

## 2023-02-04 NOTE — Plan of Care (Signed)
Problem: Education: Goal: Knowledge of the prescribed therapeutic regimen will improve Outcome: Progressing   Problem: Activity: Goal: Ability to avoid complications of mobility impairment will improve Outcome: Progressing   Problem: Clinical Measurements: Goal: Postoperative complications will be avoided or minimized Outcome: Progressing   Problem: Pain Management: Goal: Pain level will decrease with appropriate interventions Outcome: Samson, RN 02/04/23 8:07 AM

## 2023-02-04 NOTE — TOC Transition Note (Signed)
Transition of Care [facility]) - CM/SW Discharge Note   Patient Details  Name: Yvonne Long MRN: [id number] Date of Birth: 11-13-1951  [facility] CM/SW Contact:  Lennart Pall, LCSW Phone Number: 02/04/2023, 10:07 AM   Clinical Narrative:     Met with pt and spouse and confirming she has already received the RW and CPM to her home via [place].  OPPT already arranged with [facility].  No further TOC needs.  Final next level of care: OP Rehab Barriers to Discharge: No Barriers Identified   Patient Goals and CMS Choice      Discharge Placement                         Discharge Plan and Services Additional resources added to the After Visit Summary for                  DME Arranged: CPM DME Agency: [place]                  Social Determinants of Health (SDOH) Interventions SDOH Screenings   Food Insecurity: No Food Insecurity (02/03/2023)  Housing: Low Risk  (02/03/2023)  Transportation Needs: No Transportation Needs (02/03/2023)  Utilities: Not At Risk (02/03/2023)  Alcohol Screen: Low Risk  (03/15/2022)  Depression (PHQ2-9): Low Risk  (01/11/2023)  Recent Concern: Depression (PHQ2-9) - Medium Risk (12/18/2022)  Financial Resource Strain: Low Risk  (03/15/2022)  Physical Activity: Insufficiently Active (03/15/2022)  Social Connections: Moderately Integrated (03/15/2022)  Stress: No Stress Concern Present (03/15/2022)  Tobacco Use: Medium Risk (02/04/2023)     Readmission Risk Interventions     No data to display

## 2023-02-04 NOTE — Progress Notes (Signed)
PT TX NOTE  02/04/23 1035  PT Visit Information  Last PT Received On 02/04/23  Assistance Needed Pt anxious to d/c this am, returned for stair training; pt husband present for session. Pt is ready for d/c with family assist as needed   History of Present Illness 72 yo female presents to therapy s/p R TKA on 12/04/2023 due to failure of conservative measures. Pt PMH includes but is not limited to: senile purpura, LBP, t-spine pain, herpes, OSA,on CPAP CKD IIIb, DM II, HDL, L TKA (2019 and 2021)and  lumbar laminectomy (2011)  Subjective Data  Patient Stated Goal not be in pain and have to sit all of the time  Precautions  Precautions Knee  Precaution Comments no pillow under R knee  Restrictions  Weight Bearing Restrictions No  RLE Weight Bearing WBAT  Pain Assessment  Pain Assessment 0-10  Pain Location R knee  Pain Descriptors / Indicators Discomfort;Constant;Guarding  Pain Intervention(s) Monitored during session;Premedicated before session;Ice applied  Cognition  Arousal/Alertness Awake/alert  Behavior During Therapy WFL for tasks assessed/performed  Overall Cognitive Status Within Functional Limits for tasks assessed  Bed Mobility  General bed mobility comments in recliner  Transfers  Overall transfer level Needs assistance  Equipment used Rolling walker (2 wheels)  Transfers Sit to/from Stand  Sit to Stand Supervision;Modified independent (Device/Increase time)  General transfer comment cues for proper UE and LE placement, demonstrates carryover from previous session  Ambulation/Gait  Ambulation/Gait assistance Supervision  Gait Distance (Feet) 50 Feet  Assistive device Rolling walker (2 wheels)  Gait Pattern/deviations Step-to pattern  General Gait Details cues for sequence and RW position, use of UEs as needed for pain control with WBing  Gait velocity decreased  Stairs Yes  Stairs assistance Min guard  Stair Management One rail Left;Step to pattern;Forwards  Number of  Stairs 3  General stair comments cues for technique and sequence. no knee buckling or LOB noted, husband present; both familiar  with techniques from previous surgeries  Balance  Overall balance assessment Needs assistance;History of Falls (2 falls past year)  Standing balance-Leahy Scale Fair  Standing balance comment able to static stand with wide BOS without UE support  PT - End of Session  Equipment Utilized During Treatment Gait belt  Activity Tolerance Patient tolerated treatment well  Patient left in chair;with call bell/phone within reach;with family/visitor present   PT - Assessment/Plan  PT Plan Current plan remains appropriate  PT Visit Diagnosis Unsteadiness on feet (R26.81);Other abnormalities of gait and mobility (R26.89);Muscle weakness (generalized) (M62.81);History of falling (Z91.81);Pain;Difficulty in walking, not elsewhere classified (R26.2)  Pain - Right/Left Right  Pain - part of body Knee  PT Frequency (ACUTE ONLY) 7X/week  Follow Up Recommendations Follow physician's recommendations for discharge plan and follow up therapies  Assistance recommended at discharge Frequent or constant Supervision/Assistance  Patient can return home with the following A little help with walking and/or transfers;A little help with bathing/dressing/bathroom;Assist for transportation;Help with stairs or ramp for entrance;Assistance with cooking/housework  PT equipment None recommended by PT  AM-PAC PT "6 Clicks" Mobility Outcome Measure (Version 2)  Help needed turning from your back to your side while in a flat bed without using bedrails? 4  Help needed moving from lying on your back to sitting on the side of a flat bed without using bedrails? 4  Help needed moving to and from a bed to a chair (including a wheelchair)? 4  Help needed standing up from a chair using your arms (e.g., wheelchair or  bedside chair)? 4  Help needed to walk in hospital room? 3  Help needed climbing 3-5 steps with  a railing?  3  6 Click Score 22  Consider Recommendation of Discharge To: Home with no services  PT Goal Progression  Progress towards PT goals Progressing toward goals  Acute Rehab PT Goals  PT Goal Formulation With patient  Time For Goal Achievement 02/17/23  Potential to Achieve Goals Good  PT Time Calculation  PT Start Time (ACUTE ONLY) 1024  PT Stop Time (ACUTE ONLY) 1034  PT Time Calculation (min) (ACUTE ONLY) 10 min  PT General Charges  $$ ACUTE PT VISIT 1 Visit  PT Treatments  $Gait Training 8-22 mins

## 2023-02-04 NOTE — Progress Notes (Signed)
Physical Therapy Treatment Patient Details Name: Yvonne Long MRN: [id number] DOB: 15-Apr-1951 Today's Date: 02/04/2023   History of Present Illness 72 yo female presents to therapy s/p R TKA on 12/04/2023 due to failure of conservative measures. Pt PMH includes but is not limited to: senile purpura, LBP, t-spine pain, herpes, OSA,on CPAP CKD IIIb, DM II, HDL, L TKA (2019 and 2021)and  lumbar laminectomy (2011)    PT Comments    Pt progressing well, meeting goals. Will see again for stair training. Pt is motivated to d/c home today    Recommendations for follow up therapy are one component of a multi-disciplinary discharge planning process, led by the attending physician.  Recommendations may be updated based on patient status, additional functional criteria and insurance authorization.  Follow Up Recommendations  Follow physician's recommendations for discharge plan and follow up therapies     Assistance Recommended at Discharge Frequent or constant Supervision/Assistance  Patient can return home with the following A little help with walking and/or transfers;A little help with bathing/dressing/bathroom;Assist for transportation;Help with stairs or ramp for entrance;Assistance with cooking/housework   Equipment Recommendations  None recommended by PT    Recommendations for Other Services       Precautions / Restrictions Precautions Precautions: Knee Precaution Comments: no pillow under R knee Restrictions Weight Bearing Restrictions: No RLE Weight Bearing: Weight bearing as tolerated     Mobility  Bed Mobility               General bed mobility comments: in recliner    Transfers Overall transfer level: Needs assistance Equipment used: Rolling walker (2 wheels) Transfers: Sit to/from Stand Sit to Stand: Supervision           General transfer comment: cues for proper UE and LE placement    Ambulation/Gait Ambulation/Gait assistance: Supervision, Min  guard Gait Distance (Feet): 120 Feet Assistive device: Rolling walker (2 wheels) Gait Pattern/deviations: Step-to pattern Gait velocity: decreased     General Gait Details: cues for sequence and RW position, use of UEs as needed for pain control with WBing   Stairs             Wheelchair Mobility    Modified Rankin (Stroke Patients Only)       Balance Overall balance assessment: Needs assistance, History of Falls (2 falls past year)           Standing balance-Leahy Scale: Fair Standing balance comment: able to static stand with wide BOS without UE support                            Cognition Arousal/Alertness: Awake/alert Behavior During Therapy: WFL for tasks assessed/performed Overall Cognitive Status: Within Functional Limits for tasks assessed                                          Exercises Total Joint Exercises Ankle Circles/Pumps: AROM, 20 reps, Both Quad Sets: AROM, 10 reps, Both Heel Slides: AROM, AAROM, Right, 10 reps Straight Leg Raises: AROM, Right, 5 reps    General Comments        Pertinent Vitals/Pain Pain Assessment Pain Assessment: 0-10 Pain Score: 5  Pain Location: R knee Pain Descriptors / Indicators: Discomfort, Constant, Guarding Pain Intervention(s): Limited activity within patient's tolerance, Monitored during session, Premedicated before session, Repositioned, Ice applied    Home Living  Prior Function            PT Goals (current goals can now be found in the care plan section) Acute Rehab PT Goals Patient Stated Goal: not be in pain and have to sit all of the time PT Goal Formulation: With patient Time For Goal Achievement: 02/17/23 Potential to Achieve Goals: Good Progress towards PT goals: Progressing toward goals    Frequency    7X/week      PT Plan Current plan remains appropriate    Co-evaluation              AM-PAC PT "6  Clicks" Mobility   Outcome Measure  Help needed turning from your back to your side while in a flat bed without using bedrails?: None Help needed moving from lying on your back to sitting on the side of a flat bed without using bedrails?: None Help needed moving to and from a bed to a chair (including a wheelchair)?: None Help needed standing up from a chair using your arms (e.g., wheelchair or bedside chair)?: None Help needed to walk in hospital room?: A Little Help needed climbing 3-5 steps with a railing? : A Little 6 Click Score: 22    End of Session Equipment Utilized During Treatment: Gait belt Activity Tolerance: Patient tolerated treatment well Patient left: in chair;with call bell/phone within reach;with family/visitor present   PT Visit Diagnosis: Unsteadiness on feet (R26.81);Other abnormalities of gait and mobility (R26.89);Muscle weakness (generalized) (M62.81);History of falling (Z91.81);Pain;Difficulty in walking, not elsewhere classified (R26.2) Pain - Right/Left: Right Pain - part of body: Knee     Time: [id number] PT Time Calculation (min) (ACUTE ONLY): 15 min  Charges:  $Gait Training: 8-22 mins                     Baxter Flattery, PT  Acute Rehab Dept [facility]) [phone]  [facility] Weekend Pager [facility] only)  [phone]  02/04/2023    [facility] 02/04/2023, 10:21 AM

## 2023-02-05 DIAGNOSIS — M25561 Pain in right knee: Secondary | ICD-10-CM | POA: Diagnosis not present

## 2023-02-05 DIAGNOSIS — M6281 Muscle weakness (generalized): Secondary | ICD-10-CM | POA: Diagnosis not present

## 2023-02-05 NOTE — Anesthesia Postprocedure Evaluation (Signed)
Anesthesia Post Note  Patient: Yvonne Long  Procedure(s) Performed: TOTAL KNEE ARTHROPLASTY (Right: Knee)     Patient location during evaluation: PACU Anesthesia Type: Spinal Level of consciousness: oriented and awake and alert Pain management: pain level controlled Vital Signs Assessment: post-procedure vital signs reviewed and stable Respiratory status: spontaneous breathing, respiratory function stable and patient connected to nasal cannula oxygen Cardiovascular status: blood pressure returned to baseline and stable Postop Assessment: no headache, no backache and no apparent nausea or vomiting Anesthetic complications: no  No notable events documented.  Last Vitals:  Vitals:   02/04/23 0112 02/04/23 0515  BP: (!) 122/56 127/69  Pulse: 66 72  Resp: 18 19  Temp: 36.6 C 36.5 C  SpO2: 96% 95%    Last Pain:  Vitals:   02/04/23 1115  TempSrc:   PainSc: 2                  [name] S

## 2023-02-05 NOTE — Discharge Summary (Signed)
Physician Discharge Summary  Patient ID: Yvonne Long MRN: [id number] DOB/AGE: 1950/12/26 72 y.o.  Admit date: 02/03/2023 Discharge date: 02/04/23  Admission Diagnoses:  Localized osteoarthritis of right knee  Discharge Diagnoses:  Principal Problem:   Localized osteoarthritis of right knee   Past Medical History:  Diagnosis Date   Allergy    Pollen, dust   Anxiety    Perform exercise to control   Cataract    Will probably have them removed soon.   Chronic kidney disease (CKD) stage G3b/A1, moderately decreased glomerular filtration rate (GFR) between 30-44 mL/min/1.73 square meter and albuminuria creatinine ratio less than 30 mg/g (HCC) 03/19/2016   Depression    Diabetes mellitus without complication ([place])    Encounter for weight management 08/12/2022   GERD (gastroesophageal reflux disease)    Herpes simplex infection 07/17/2019   Oral and genital   Hyperlipidemia 07/24/2008   Qualifier: Diagnosis of  By: Edman Circle RN, Paula     Hypertension    Take meds to control   Left knee pain 11/20/2015   Major depressive disorder, recurrent episode ([place]) 09/28/2006   Qualifier: Diagnosis of  By: Madilyn Fireman MD, Catherine     OSA (obstructive sleep apnea) 11/11/2018   Study 10/2018 IMPRESSIONS - Mild obstructive sleep apnea occurred during this study (AHI = 14.2/h). - No significant central sleep apnea occurred during this study (CAI = 0.0/h). - Oxygen desaturation was noted during this study (Min O2 = 85%). - Patient snored.   Palpitations 07/05/2007   Qualifier: Diagnosis of  By: Madilyn Fireman MD, Laverda Sorenson localized osteoarthritis of left knee 11/02/2018   Sleep apnea    Use CPAP    Surgeries: Procedure(s): TOTAL KNEE ARTHROPLASTY on 02/03/2023   Consultants (if any):   Discharged Condition: Improved  Hospital Course: Yvonne Long is an 72 y.o. female who was admitted 02/03/2023 with a diagnosis of Localized osteoarthritis of right knee and went to the  operating room on 02/03/2023 and underwent the above named procedures.    She was given perioperative antibiotics:  Anti-infectives (From admission, onward)    Start     Dose/Rate Route Frequency Ordered Stop   02/03/23 1600  ceFAZolin (ANCEF) IVPB 2g/100 mL premix        2 g 200 mL/hr over 30 Minutes Intravenous Every 6 hours 02/03/23 1327 02/04/23 0701   02/03/23 0800  ceFAZolin (ANCEF) IVPB 2g/100 mL premix        2 g 200 mL/hr over 30 Minutes Intravenous On call to O.R. 02/03/23 0749 02/03/23 1048     .  She was given sequential compression devices, early ambulation, and aspirin for DVT prophylaxis.  She benefited maximally from the hospital stay and there were no complications.    Recent vital signs:  Vitals:   02/04/23 0112 02/04/23 0515  BP: (!) 122/56 127/69  Pulse: 66 72  Resp: 18 19  Temp: 97.9 F (36.6 C) 97.7 F (36.5 C)  SpO2: 96% 95%    Recent laboratory studies:  Lab Results  Component Value Date   HGB 12.6 02/04/2023   HGB 13.7 01/21/2023   HGB 13.8 06/19/2022   Lab Results  Component Value Date   WBC 12.8 (H) 02/04/2023   PLT 277 02/04/2023   Lab Results  Component Value Date   INR 1.0 10/15/2019   Lab Results  Component Value Date   NA 137 02/04/2023   K 4.0 02/04/2023   CL 104 02/04/2023   CO2 23 02/04/2023  BUN 13 02/04/2023   CREATININE 0.77 02/04/2023   GLUCOSE 153 (H) 02/04/2023    Discharge Medications:   Allergies as of 02/04/2023   No Active Allergies      Medication List     STOP taking these medications    finasteride 5 MG tablet Commonly known as: PROSCAR       TAKE these medications    acetaminophen 500 MG tablet Commonly known as: TYLENOL Take 1,000 mg by mouth every 6 (six) hours as needed for moderate pain. What changed: Another medication with the same name was added. Make sure you understand how and when to take each.   acetaminophen 500 MG tablet Commonly known as: TYLENOL Take 2 tablets (1,000 mg  total) by mouth every 8 (eight) hours as needed. What changed: You were already taking a medication with the same name, and this prescription was added. Make sure you understand how and when to take each.   AMBULATORY NON FORMULARY MEDICATION Medication Name: CPAP with nasal pillows and humidifier.  Set to AutoPap 4-20 cm watere pressure setting for 10 days and then please fax his download so that we can set her pressure.  New diagnosis of obstructive sleep apnea with AHI of 14.2.  Was faxed to [facility]   amLODipine 5 MG tablet Commonly known as: NORVASC Take 1 tablet (5 mg total) by mouth daily.   aspirin EC 81 MG tablet Take 1 tablet (81 mg total) by mouth 2 (two) times daily for 28 days. Swallow whole.   atorvastatin 20 MG tablet Commonly known as: LIPITOR Take 1 tablet (20 mg total) by mouth daily.   b complex vitamins capsule Take 1 capsule by mouth daily.   BIOTIN PO Take 1 tablet by mouth daily.   celecoxib 100 MG capsule Commonly known as: CeleBREX Take 1 capsule (100 mg total) by mouth 2 (two) times daily for 14 days.   Glucosamine HCl 1500 MG Tabs Take 1 tablet by mouth daily.   HYDROcodone-acetaminophen 5-325 MG tablet Commonly known as: NORCO/VICODIN Take 1 tablet by mouth every 4 (four) hours as needed for up to 7 days for moderate pain.   Magnesium 400 MG Tabs Take 400 mg by mouth daily.   Melatonin 10 MG Tabs Take 10 mg by mouth at bedtime.   metFORMIN 500 MG tablet Commonly known as: GLUCOPHAGE Take 1 tablet (500 mg total) by mouth 2 (two) times daily with a meal.   methocarbamol 500 MG tablet Commonly known as: ROBAXIN Take 1 tablet (500 mg total) by mouth every 8 (eight) hours as needed for up to 10 days for muscle spasms.   ondansetron 4 MG tablet Commonly known as: Zofran Take 1 tablet (4 mg total) by mouth every 8 (eight) hours as needed for up to 14 days for nausea or vomiting.   pantoprazole 40 MG tablet Commonly known as:  PROTONIX Take 1 tablet (40 mg total) by mouth daily. What changed:  when to take this reasons to take this   PARoxetine 30 MG tablet Commonly known as: PAXIL Take 1 tablet (30 mg total) by mouth daily. Needs a follow up appointment.   VISINE OP Place 1 drop into both eyes daily.   Vitamin D3 25 MCG (1000 UT) capsule Generic drug: Cholecalciferol Take 1,000 Int'l Units by mouth.        Diagnostic Studies: DG Knee Right Port  Result Date: 02/03/2023 CLINICAL DATA:  Postop right knee arthroplasty EXAM: PORTABLE RIGHT KNEE - 1-2 VIEW COMPARISON:  11/20/2015 FINDINGS: Right total knee arthroplasty changes noted. Components appear aligned. No acute osseous finding or hardware abnormality. Expected postop changes of the soft tissues and joint space with scattered air noted. Patella is located. IMPRESSION: Expected postoperative appearance status post right total knee arthroplasty. Electronically Signed   By: Jerilynn Mages.  Shick M.D.   On: 02/03/2023 13:13    Disposition: Discharge disposition: 01-Home or Self Care       Discharge Instructions     Call MD / Call 911   Complete by: As directed    If you experience chest pain or shortness of breath, CALL 911 and be transported to the hospital emergency room.  If you develope a fever above 101 F, pus (white drainage) or increased drainage or redness at the wound, or calf pain, call your surgeon's office.   Constipation Prevention   Complete by: As directed    Drink plenty of fluids.  Prune juice may be helpful.  You may use a stool softener, such as Colace (over the counter) 100 mg twice a day.  Use MiraLax (over the counter) for constipation as needed.   Diet - low sodium heart healthy   Complete by: As directed    Increase activity slowly as tolerated   Complete by: As directed    Post-operative opioid taper instructions:   Complete by: As directed    POST-OPERATIVE OPIOID TAPER INSTRUCTIONS: It is important to wean off of your opioid  medication as soon as possible. If you do not need pain medication after your surgery it is ok to stop day one. Opioids include: Codeine, Hydrocodone(Norco, Vicodin), Oxycodone(Percocet, oxycontin) and hydromorphone amongst others.  Long term and even short term use of opiods can cause: Increased pain response Dependence Constipation Depression Respiratory depression And more.  Withdrawal symptoms can include Flu like symptoms Nausea, vomiting And more Techniques to manage these symptoms Hydrate well Eat regular healthy meals Stay active Use relaxation techniques(deep breathing, meditating, yoga) Do Not substitute Alcohol to help with tapering If you have been on opioids for less than two weeks and do not have pain than it is ok to stop all together.  Plan to wean off of opioids This plan should start within one week post op of your joint replacement. Maintain the same interval or time between taking each dose and first decrease the dose.  Cut the total daily intake of opioids by one tablet each day Next start to increase the time between doses. The last dose that should be eliminated is the evening dose.           Follow-up Information     Willaim Sheng, MD. Go on 02/18/2023.   Specialty: Orthopedic Surgery Why: Your appointment is scheduled for 2:45 Contact information: [address] [place] [address] [phone]         [facility] [place]. Go on 02/05/2023.   Why: YOur appoinment is scheduled for 10:00. Contact information: [phone]                   Discharge Instructions      INSTRUCTIONS AFTER JOINT REPLACEMENT   Remove items at home which could result in a fall. This includes throw rugs or furniture in walking pathways ICE to the affected joint every three hours while awake for 30 minutes at a time, for at least the first 3-5 days, and then as needed for pain and swelling.  Continue to use ice for pain and  swelling.  You may notice swelling that will progress down to the foot and ankle.  This is normal after surgery.  Elevate your leg when you are not up walking on it.   Continue to use the breathing machine you got in the hospital (incentive spirometer) which will help keep your temperature down.  It is common for your temperature to cycle up and down following surgery, especially at night when you are not up moving around and exerting yourself.  The breathing machine keeps your lungs expanded and your temperature down.   DIET:  As you were doing prior to hospitalization, we recommend a well-balanced diet.  DRESSING / WOUND CARE / SHOWERING  Keep the surgical dressing until follow up.  The dressing is water proof, so you can shower without any extra covering.  IF THE DRESSING FALLS OFF or the wound gets wet inside, change the dressing with sterile gauze.  Please use good hand washing techniques before changing the dressing.  Do not use any lotions or creams on the incision until instructed by your surgeon.    ACTIVITY  Increase activity slowly as tolerated, but follow the weight bearing instructions below.   No driving for 6 weeks or until further direction given by your physician.  You cannot drive while taking narcotics.  No lifting or carrying greater than 10 lbs. until further directed by your surgeon. Avoid periods of inactivity such as sitting longer than an hour when not asleep. This helps prevent blood clots.  You may return to work once you are authorized by your doctor.     WEIGHT BEARING   Weight bearing as tolerated with assist device (walker, cane, etc) as directed, use it as long as suggested by your surgeon or therapist, typically at least 4-6 weeks.   EXERCISES  Results after joint replacement surgery are often greatly improved when you follow the exercise, range of motion and muscle strengthening exercises prescribed by your doctor. Safety measures are also important to  protect the joint from further injury. Any time any of these exercises cause you to have increased pain or swelling, decrease what you are doing until you are comfortable again and then slowly increase them. If you have problems or questions, call your caregiver or physical therapist for advice.   Rehabilitation is important following a joint replacement. After just a few days of immobilization, the muscles of the leg can become weakened and shrink (atrophy).  These exercises are designed to build up the tone and strength of the thigh and leg muscles and to improve motion. Often times heat used for twenty to thirty minutes before working out will loosen up your tissues and help with improving the range of motion but do not use heat for the first two weeks following surgery (sometimes heat can increase post-operative swelling).   These exercises can be done on a training (exercise) mat, on the floor, on a table or on a bed. Use whatever works the best and is most comfortable for you.    Use music or television while you are exercising so that the exercises are a pleasant break in your day. This will make your life better with the exercises acting as a break in your routine that you can look forward to.   Perform all exercises about fifteen times, three times per day or as directed.  You should exercise both the operative leg and the other leg as well.  Exercises include:   Quad Sets - Tighten up the muscle on  the front of the thigh (Quad) and hold for 5-10 seconds.   Straight Leg Raises - With your knee straight (if you were given a brace, keep it on), lift the leg to 60 degrees, hold for 3 seconds, and slowly lower the leg.  Perform this exercise against resistance later as your leg gets stronger.  Leg Slides: Lying on your back, slowly slide your foot toward your buttocks, bending your knee up off the floor (only go as far as is comfortable). Then slowly slide your foot back down until your leg is flat on  the floor again.  Angel Wings: Lying on your back spread your legs to the side as far apart as you can without causing discomfort.  Hamstring Strength:  Lying on your back, push your heel against the floor with your leg straight by tightening up the muscles of your buttocks.  Repeat, but this time bend your knee to a comfortable angle, and push your heel against the floor.  You may put a pillow under the heel to make it more comfortable if necessary.   A rehabilitation program following joint replacement surgery can speed recovery and prevent re-injury in the future due to weakened muscles. Contact your doctor or a physical therapist for more information on knee rehabilitation.    CONSTIPATION  Constipation is defined medically as fewer than three stools per week and severe constipation as less than one stool per week.  Even if you have a regular bowel pattern at home, your normal regimen is likely to be disrupted due to multiple reasons following surgery.  Combination of anesthesia, postoperative narcotics, change in appetite and fluid intake all can affect your bowels.   YOU MUST use at least one of the following options; they are listed in order of increasing strength to get the job done.  They are all available over the counter, and you may need to use some, POSSIBLY even all of these options:    Drink plenty of fluids (prune juice may be helpful) and high fiber foods Colace 100 mg by mouth twice a day  Senokot for constipation as directed and as needed Dulcolax (bisacodyl), take with full glass of water  Miralax (polyethylene glycol) once or twice a day as needed.  If you have tried all these things and are unable to have a bowel movement in the first 3-4 days after surgery call either your surgeon or your primary doctor.    If you experience loose stools or diarrhea, hold the medications until you stool forms back up.  If your symptoms do not get better within 1 week or if they get worse,  check with your doctor.  If you experience "the worst abdominal pain ever" or develop nausea or vomiting, please contact the office immediately for further recommendations for treatment.   ITCHING:  If you experience itching with your medications, try taking only a single pain pill, or even half a pain pill at a time.  You can also use Benadryl over the counter for itching or also to help with sleep.   TED HOSE STOCKINGS:  Use stockings on both legs until for at least 2 weeks or as directed by physician office. They may be removed at night for sleeping.  MEDICATIONS:  See your medication summary on the "After Visit Summary" that nursing will review with you.  You may have some home medications which will be placed on hold until you complete the course of blood thinner medication.  It is important  for you to complete the blood thinner medication as prescribed.   Blood clot prevention (DVT Prophylaxis): After surgery you are at an increased risk for a blood clot. you were prescribed a blood thinner, Aspirin 3m, to be taken twice daily for a total of 4 weeks from surgery to help reduce your risk of getting a blood clot. This will help prevent a blood clot. Signs of a pulmonary embolus (blood clot in the lungs) include sudden short of breath, feeling lightheaded or dizzy, chest pain with a deep breath, rapid pulse rapid breathing. Signs of a blood clot in your arms or legs include new unexplained swelling and cramping, warm, red or darkened skin around the painful area. Please call the office or 911 right away if these signs or symptoms develop.  PRECAUTIONS:  If you experience chest pain or shortness of breath - call 911 immediately for transfer to the hospital emergency department.   If you develop a fever greater that 101 F, purulent drainage from wound, increased redness or drainage from wound, foul odor from the wound/dressing, or calf pain - CONTACT YOUR SURGEON.                                                    FOLLOW-UP APPOINTMENTS:  If you do not already have a post-op appointment, please call the office for an appointment to be seen by your surgeon.  Guidelines for how soon to be seen are listed in your "After Visit Summary", but are typically between 2-3 weeks after surgery.  OTHER INSTRUCTIONS:   Knee Replacement:  Do not place pillow under knee, focus on keeping the knee straight while resting.  DO NOT modify, tear, cut, or change the foam block in any way.  POST-OPERATIVE OPIOID TAPER INSTRUCTIONS: It is important to wean off of your opioid medication as soon as possible. If you do not need pain medication after your surgery it is ok to stop day one. Opioids include: Codeine, Hydrocodone(Norco, Vicodin), Oxycodone(Percocet, oxycontin) and hydromorphone amongst others.  Long term and even short term use of opiods can cause: Increased pain response Dependence Constipation Depression Respiratory depression And more.  Withdrawal symptoms can include Flu like symptoms Nausea, vomiting And more Techniques to manage these symptoms Hydrate well Eat regular healthy meals Stay active Use relaxation techniques(deep breathing, meditating, yoga) Do Not substitute Alcohol to help with tapering If you have been on opioids for less than two weeks and do not have pain than it is ok to stop all together.  Plan to wean off of opioids This plan should start within one week post op of your joint replacement. Maintain the same interval or time between taking each dose and first decrease the dose.  Cut the total daily intake of opioids by one tablet each day Next start to increase the time between doses. The last dose that should be eliminated is the evening dose.   MAKE SURE YOU:  Understand these instructions.  Get help right away if you are not doing well or get worse.    Thank you for letting [facility] a part of your medical care team.  It is a privilege we respect greatly.  We hope  these instructions will help you stay on track for a fast and full recovery!  Signed: Thersea Manfredonia A Damir Leung 02/05/2023, 6:55 AM

## 2023-02-08 DIAGNOSIS — M6281 Muscle weakness (generalized): Secondary | ICD-10-CM | POA: Diagnosis not present

## 2023-02-08 DIAGNOSIS — M25561 Pain in right knee: Secondary | ICD-10-CM | POA: Diagnosis not present

## 2023-02-10 ENCOUNTER — Ambulatory Visit: Payer: Medicare Other | Admitting: Cardiology

## 2023-02-10 DIAGNOSIS — M6281 Muscle weakness (generalized): Secondary | ICD-10-CM | POA: Diagnosis not present

## 2023-02-10 DIAGNOSIS — M25561 Pain in right knee: Secondary | ICD-10-CM | POA: Diagnosis not present

## 2023-02-12 DIAGNOSIS — M25561 Pain in right knee: Secondary | ICD-10-CM | POA: Diagnosis not present

## 2023-02-12 DIAGNOSIS — M6281 Muscle weakness (generalized): Secondary | ICD-10-CM | POA: Diagnosis not present

## 2023-02-15 DIAGNOSIS — M25561 Pain in right knee: Secondary | ICD-10-CM | POA: Diagnosis not present

## 2023-02-15 DIAGNOSIS — M6281 Muscle weakness (generalized): Secondary | ICD-10-CM | POA: Diagnosis not present

## 2023-02-15 MED ORDER — FLUCONAZOLE 150 MG PO TABS: 150.0000 mg | ORAL_TABLET | Freq: Once | ORAL | 0 refills | Status: AC

## 2023-02-17 ENCOUNTER — Ambulatory Visit: Payer: Medicare Other | Admitting: Skilled Nursing Facility1

## 2023-02-17 DIAGNOSIS — M25561 Pain in right knee: Secondary | ICD-10-CM | POA: Diagnosis not present

## 2023-02-17 DIAGNOSIS — M6281 Muscle weakness (generalized): Secondary | ICD-10-CM | POA: Diagnosis not present

## 2023-02-18 ENCOUNTER — Ambulatory Visit: Payer: Medicare Other | Admitting: Family Medicine

## 2023-02-18 DIAGNOSIS — M1711 Unilateral primary osteoarthritis, right knee: Secondary | ICD-10-CM | POA: Diagnosis not present

## 2023-02-19 DIAGNOSIS — M6281 Muscle weakness (generalized): Secondary | ICD-10-CM | POA: Diagnosis not present

## 2023-02-19 DIAGNOSIS — M25561 Pain in right knee: Secondary | ICD-10-CM | POA: Diagnosis not present

## 2023-02-23 DIAGNOSIS — M6281 Muscle weakness (generalized): Secondary | ICD-10-CM | POA: Diagnosis not present

## 2023-02-23 DIAGNOSIS — M25561 Pain in right knee: Secondary | ICD-10-CM | POA: Diagnosis not present

## 2023-02-25 DIAGNOSIS — M6281 Muscle weakness (generalized): Secondary | ICD-10-CM | POA: Diagnosis not present

## 2023-02-25 DIAGNOSIS — M25561 Pain in right knee: Secondary | ICD-10-CM | POA: Diagnosis not present

## 2023-02-26 DIAGNOSIS — R1011 Right upper quadrant pain: Secondary | ICD-10-CM | POA: Insufficient documentation

## 2023-03-02 DIAGNOSIS — M6281 Muscle weakness (generalized): Secondary | ICD-10-CM | POA: Diagnosis not present

## 2023-03-02 DIAGNOSIS — M25561 Pain in right knee: Secondary | ICD-10-CM | POA: Diagnosis not present

## 2023-03-03 ENCOUNTER — Ambulatory Visit: Payer: Medicare Other | Attending: Cardiology | Admitting: Cardiology

## 2023-03-03 ENCOUNTER — Encounter: Payer: Self-pay | Admitting: Cardiology

## 2023-03-03 VITALS — BP 140/74 | HR 74 | Ht 66.0 in | Wt 211.0 lb

## 2023-03-03 DIAGNOSIS — R002 Palpitations: Secondary | ICD-10-CM

## 2023-03-03 DIAGNOSIS — E669 Obesity, unspecified: Secondary | ICD-10-CM

## 2023-03-03 DIAGNOSIS — E782 Mixed hyperlipidemia: Secondary | ICD-10-CM

## 2023-03-03 DIAGNOSIS — E1169 Type 2 diabetes mellitus with other specified complication: Secondary | ICD-10-CM | POA: Diagnosis not present

## 2023-03-03 DIAGNOSIS — G4733 Obstructive sleep apnea (adult) (pediatric): Secondary | ICD-10-CM | POA: Diagnosis not present

## 2023-03-03 NOTE — Progress Notes (Signed)
Cardiology Office Note:    Date:  03/03/2023   ID:  Yvonne Long, DOB [birth year], MRN [id number]  PCP:  Yvonne Marry, MD  Cardiologist:  Yvonne Campus, MD    Referring MD: Yvonne Long, *   No chief complaint on file. Doing well  History of Present Illness:    Yvonne Long is a 72 y.o. female with past medical history significant for essential hypertension, obstructive sleep apnea, she was referred to [place] because of palpitation she was finding of a short episode of supraventricular tachycardia she was put initially on verapamil however started having some hair loss and was convinced that that was related to verapamil verapamil has been discontinued, since that time she is doing very well.  She denies have any chest pain tightness squeezing pressure burning chest.  Recently she had a stress test done and stress test was done as part of ambulation before elective knee replacement surgery stress test was normal and she went to surgery a month ago with no difficulties recovering doing well.  Past Medical History:  Diagnosis Date   Allergy    Pollen, dust   Anxiety    Perform exercise to control   Cataract    Will probably have them removed soon.   Chronic kidney disease (CKD) stage G3b/A1, moderately decreased glomerular filtration rate (GFR) between 30-44 mL/min/1.73 square meter and albuminuria creatinine ratio less than 30 mg/g (HCC) 03/19/2016   Depression    Diabetes mellitus without complication (Yvonne Long)    Encounter for weight management 08/12/2022   GERD (gastroesophageal reflux disease)    Herpes simplex infection 07/17/2019   Oral and genital   Hyperlipidemia 07/24/2008   Qualifier: Diagnosis of  By: Yvonne Circle RN, Paula     Hypertension    Take meds to control   Left knee pain 11/20/2015   Major depressive disorder, recurrent episode (Yvonne Long) 09/28/2006   Qualifier: Diagnosis of  By: Yvonne Fireman MD, Yvonne Long     OSA (obstructive sleep apnea)  11/11/2018   Study 10/2018 IMPRESSIONS - Mild obstructive sleep apnea occurred during this study (AHI = 14.2/h). - No significant central sleep apnea occurred during this study (CAI = 0.0/h). - Oxygen desaturation was noted during this study (Min O2 = 85%). - Patient snored.   Palpitations 07/05/2007   Qualifier: Diagnosis of  By: Yvonne Fireman MD, Yvonne Long     Primary localized osteoarthritis of left knee 11/02/2018   Sleep apnea    Use CPAP    Past Surgical History:  Procedure Laterality Date   ABDOMINAL HYSTERECTOMY  2005   Dr. Toy Long   BILATERAL SALPINGOOPHORECTOMY  2005   Dr. Noralee Long   CHOLECYSTECTOMY  2013   Yvonne Long  2005   with bladder tack   JOINT REPLACEMENT  2021   Left knee   SPINE SURGERY  01/2010   Lumbar Laminectomy-Dr. Owens Shark   TOTAL KNEE ARTHROPLASTY Left 11/14/2018   Procedure: left TOTAL KNEE ARTHROPLASTY;  Surgeon: Elsie Saas, MD;  Location: [facility];  Service: Orthopedics;  Laterality: Left;   TOTAL KNEE ARTHROPLASTY Right 02/03/2023   Procedure: TOTAL KNEE ARTHROPLASTY;  Surgeon: Willaim Sheng, MD;  Location: [facility];  Service: Orthopedics;  Laterality: Right;    Current Medications: Current Meds  Medication Sig   acetaminophen (TYLENOL) 500 MG tablet Take 1,000 mg by mouth every 6 (six) hours as needed for moderate pain.   AMBULATORY NON FORMULARY MEDICATION Medication Name: CPAP with nasal pillows and humidifier.  Set to AutoPap 4-20 cm watere pressure  setting for 10 days and then please fax his download so that we can set her pressure.  New diagnosis of obstructive sleep apnea with AHI of 14.2.  Was faxed to [facility]   amLODipine (NORVASC) 5 MG tablet Take 1 tablet (5 mg total) by mouth daily.   aspirin EC 81 MG tablet Take 1 tablet (81 mg total) by mouth 2 (two) times daily for 28 days. Swallow whole.   atorvastatin (LIPITOR) 20 MG tablet Take 1 tablet (20 mg total) by mouth daily.   b complex vitamins capsule Take 1 capsule by mouth  daily.   BIOTIN PO Take 1 tablet by mouth daily.   Cholecalciferol (VITAMIN D3) 25 MCG (1000 UT) CAPS Take 1,000 Int'l Units by mouth.   Glucosamine HCl 1500 MG TABS Take 1 tablet by mouth daily.   Magnesium 400 MG TABS Take 400 mg by mouth daily.   Melatonin 10 MG TABS Take 10 mg by mouth at bedtime.   metFORMIN (GLUCOPHAGE) 500 MG tablet Take 1 tablet (500 mg total) by mouth 2 (two) times daily with a meal.   PARoxetine (PAXIL) 30 MG tablet Take 1 tablet (30 mg total) by mouth daily. Needs a follow up appointment.   Tetrahydrozoline HCl (VISINE OP) Place 1 drop into both eyes daily.     Allergies:   Patient has no active allergies.   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: [employer]    Comment: Retired   Occupation: Freight forwarder    Comment: Part-time  Tobacco Use   Smoking status: Former    Packs/day: 1.00    Years: 15.00    Total pack years: 15.00    Types: Cigarettes    Quit date: 12/21/1994    Years since quitting: 28.2   Smokeless tobacco: Never  Vaping Use   Vaping Use: Never used  Substance and Sexual Activity   Alcohol use: Yes    Alcohol/week: 7.0 standard drinks of alcohol    Types: 7 Glasses of wine per week    Comment: 1 alcoholic drink per day   Drug use: No   Sexual activity: Not Currently    Birth control/protection: Post-menopausal  Other Topics Concern   Not on file  Social History Narrative   Lives with her husband. Enjoys yard work and yoga.    Social Determinants of Health   Financial Resource Strain: Low Risk  (03/15/2022)   Overall Financial Resource Strain (CARDIA)    Difficulty of Paying Living Expenses: Not hard at all  Food Insecurity: No Food Insecurity (02/03/2023)   Hunger Vital Sign    Worried About  Running Out of Food in the Last Year: Never true    Ran Out of Food in the Last Year: Never true  Transportation Needs: No Transportation Needs (02/03/2023)   PRAPARE - Hydrologist (Medical): No    Lack of Transportation (Non-Medical): No  Physical Activity: Insufficiently Active (03/15/2022)   Exercise Vital Sign    Days of Exercise per Week: 2 days    Minutes of Exercise per Session: 30 min  Stress: No Stress Concern Present (03/15/2022)   [place]    Feeling  of Stress : Not at all  Social Connections: Moderately Integrated (03/15/2022)   Social Connection and Isolation Panel [NHANES]    Frequency of Communication with Friends and Family: More than three times a week    Frequency of Social Gatherings with Friends and Family: More than three times a week    Attends Religious Services: More than 4 times per year    Active Member of [employer] or Organizations: No    Attends Music therapist: Patient refused    Marital Status: Married     Family History: The patient's family history includes Alcohol abuse in her father; Anxiety disorder in her father, sister, and son; Arthritis in her maternal grandmother and paternal grandmother; Cancer in her maternal grandmother and another family member; Depression in her father, sister, sister, and son; Drug abuse in her son; Hearing loss in her mother; Heart attack in an other family member; Heart disease in her maternal grandfather and paternal grandfather; Hypertension in her mother; Obesity in her sister and sister; Peripheral vascular disease in her father; Stroke in an other family member; Thrombosis in her father. ROS:   Please see the history of present illness.    All 14 point review of systems negative except as described per history of present illness  EKGs/Labs/Other Studies Reviewed:      Recent Labs: 01/21/2023: ALT 25 02/04/2023: BUN  13; Creatinine, Ser 0.77; Hemoglobin 12.6; Platelets 277; Potassium 4.0; Sodium 137  Recent Lipid Panel    Component Value Date/Time   CHOL 152 06/19/2022 0000   TRIG 61 06/19/2022 0000   HDL 77 06/19/2022 0000   CHOLHDL 2.0 06/19/2022 0000   VLDL 31 (H) 07/17/2015 0806   LDLCALC 61 06/19/2022 0000    Physical Exam:    VS:  BP (!) 140/74 (BP Location: Left Arm, Patient Position: Sitting)   Pulse 74   Ht '5\' 6"'$  (1.676 m)   Wt 211 lb (95.7 kg)   SpO2 96%   BMI 34.06 kg/m     Wt Readings from Last 3 Encounters:  03/03/23 211 lb (95.7 kg)  02/03/23 213 lb 13.5 oz (97 kg)  01/21/23 213 lb (96.6 kg)     GEN:  Well nourished, well developed in no acute distress HEENT: Normal NECK: No JVD; No carotid bruits LYMPHATICS: No lymphadenopathy CARDIAC: RRR, no murmurs, no rubs, no gallops RESPIRATORY:  Clear to auscultation without rales, wheezing or rhonchi  ABDOMEN: Soft, non-tender, non-distended MUSCULOSKELETAL:  No edema; No deformity  SKIN: Warm and dry LOWER EXTREMITIES: no swelling NEUROLOGIC:  Alert and oriented x 3 PSYCHIATRIC:  Normal affect   ASSESSMENT:    1. PALPITATIONS   2. Mixed hyperlipidemia   3. OSA (obstructive sleep apnea)   4. Diabetes mellitus type 2 in obese [facility])    PLAN:    In order of problems listed above:  Palpitations.  Denies having any.  I told her if she does not take any medication right now to suppress arrhythmia and I told her if arrhythmia started become a problem again she need to let me know. Mixed dyslipidemia I did review her K PN which show me her LDL 61 HDL 77 we will continue present management. Obstructive sleep apnea that managed by antimedicine team. Diabetes followed by antimedicine team I did review hemoglobin A1c 6.7 from January 21, 2023. She did have a stress test done before her elective knee replacement surgery stress test was negative   Medication Adjustments/Labs and Tests Ordered: Current medicines are  reviewed at  length with the patient today.  Concerns regarding medicines are outlined above.  No orders of the defined types were placed in this encounter.  Medication changes: No orders of the defined types were placed in this encounter.   Signed, Park Liter, MD, [facility] 03/03/2023 3:24 PM    [facility]

## 2023-03-03 NOTE — Patient Instructions (Signed)

## 2023-03-04 DIAGNOSIS — M6281 Muscle weakness (generalized): Secondary | ICD-10-CM | POA: Diagnosis not present

## 2023-03-04 DIAGNOSIS — M25561 Pain in right knee: Secondary | ICD-10-CM | POA: Diagnosis not present

## 2023-03-09 DIAGNOSIS — M6281 Muscle weakness (generalized): Secondary | ICD-10-CM | POA: Diagnosis not present

## 2023-03-09 DIAGNOSIS — M25561 Pain in right knee: Secondary | ICD-10-CM | POA: Diagnosis not present

## 2023-03-11 DIAGNOSIS — M6281 Muscle weakness (generalized): Secondary | ICD-10-CM | POA: Diagnosis not present

## 2023-03-11 DIAGNOSIS — M25561 Pain in right knee: Secondary | ICD-10-CM | POA: Diagnosis not present

## 2023-03-16 ENCOUNTER — Encounter: Payer: Self-pay | Admitting: Family Medicine

## 2023-03-16 ENCOUNTER — Ambulatory Visit (INDEPENDENT_AMBULATORY_CARE_PROVIDER_SITE_OTHER): Payer: Medicare Other | Admitting: Family Medicine

## 2023-03-16 VITALS — BP 132/60 | HR 73 | Ht 66.0 in

## 2023-03-16 DIAGNOSIS — M6281 Muscle weakness (generalized): Secondary | ICD-10-CM | POA: Diagnosis not present

## 2023-03-16 DIAGNOSIS — F330 Major depressive disorder, recurrent, mild: Secondary | ICD-10-CM

## 2023-03-16 DIAGNOSIS — E1169 Type 2 diabetes mellitus with other specified complication: Secondary | ICD-10-CM

## 2023-03-16 DIAGNOSIS — E669 Obesity, unspecified: Secondary | ICD-10-CM

## 2023-03-16 DIAGNOSIS — N1832 Chronic kidney disease, stage 3b: Secondary | ICD-10-CM

## 2023-03-16 DIAGNOSIS — M25561 Pain in right knee: Secondary | ICD-10-CM | POA: Diagnosis not present

## 2023-03-16 LAB — POCT GLYCOSYLATED HEMOGLOBIN (HGB A1C): Hemoglobin A1C: 6 % — AB (ref 4.0–5.6)

## 2023-03-16 NOTE — Assessment & Plan Note (Signed)
Continue current regimen and follow-up in 6 months.

## 2023-03-16 NOTE — Progress Notes (Signed)
Established Patient Office Visit  Subjective   Patient ID: Yvonne Long, female    DOB: 02-06-1951  Age: 72 y.o. MRN: [id number]  Chief Complaint  Patient presents with   Follow-up         HPI  Diabetes - no hypoglycemic events. No wounds or sores that are not healing well. No increased thirst or urination. Checking glucose at home. Taking medications as prescribed without any side effects.  Did really cut back on sweets and has cut out a lot of meat products and feels like she is doing better.  She recently underwent right knee replacement surgery and is actually doing really well she is still undergoing physical therapy she recently started applying some vitamin E to help with the wound itself.  She has had a lot more soreness with this knee replacement compared to her left that she had about 8 years ago.  She has been having a little bit more pain at night but is otherwise doing well.  Major depressive disorder-she feels like she is doing well on her Paxil and is happy with her current regimen.      ROS    Objective:     BP 132/60   Pulse 73   Ht 5\' 6"  (1.676 m)   SpO2 96%   BMI 34.06 kg/m     Physical Exam Vitals and nursing note reviewed.  Constitutional:      Appearance: She is well-developed.  HENT:     Head: Normocephalic and atraumatic.  Cardiovascular:     Rate and Rhythm: Normal rate and regular rhythm.     Heart sounds: Normal heart sounds.  Pulmonary:     Effort: Pulmonary effort is normal.     Breath sounds: Normal breath sounds.  Skin:    General: Skin is warm and dry.  Neurological:     Mental Status: She is alert and oriented to person, place, and time.  Psychiatric:        Behavior: Behavior normal.      Results for orders placed or performed in visit on 03/16/23  POCT glycosylated hemoglobin (Hb A1C)  Result Value Ref Range   Hemoglobin A1C 6.0 (A) 4.0 - 5.6 %   HbA1c POC (<> result, manual entry)     HbA1c, POC  (prediabetic range)     HbA1c, POC (controlled diabetic range)      Last metabolic panel Lab Results  Component Value Date   GLUCOSE 153 (H) 02/04/2023   NA 137 02/04/2023   K 4.0 02/04/2023   CL 104 02/04/2023   CO2 23 02/04/2023   BUN 13 02/04/2023   CREATININE 0.77 02/04/2023   GFRNONAA >60 02/04/2023   CALCIUM 8.7 (L) 02/04/2023   PROT 6.8 01/21/2023   ALBUMIN 3.6 01/21/2023   BILITOT 0.6 01/21/2023   ALKPHOS 54 01/21/2023   AST 19 01/21/2023   ALT 25 01/21/2023   ANIONGAP 10 02/04/2023      The 10-year ASCVD risk score (Arnett DK, et al., 2019) is: 21.2%    Assessment & Plan:   Problem List Items Addressed This Visit       Endocrine   Diabetes mellitus type 2 in obese ([place]) - Primary    A1c looks great today at 6.0.  She has really made some great changes and it is reflected in her A1c today she says her goal is to continue to get it down even further and come off of the metformin at some point.  She  is also lost some weight which is fantastic.      Relevant Orders   POCT glycosylated hemoglobin (Hb A1C) (Completed)     Genitourinary   Chronic kidney disease (CKD) stage G3b/A1, moderately decreased glomerular filtration rate (GFR) between 30-44 mL/min/1.73 square meter and albuminuria creatinine ratio less than 30 mg/g (HCC)    You to follow renal function every 6 months.  Last serum creatinine was in February.  Will be due for labs again this summer.        Other   Major depressive disorder, recurrent episode ([place])    Continue current regimen and follow-up in 6 months.       Return in about 4 months (around 07/16/2023) for Diabetes follow-up.    Beatrice Lecher, MD

## 2023-03-16 NOTE — Assessment & Plan Note (Signed)
You to follow renal function every 6 months.  Last serum creatinine was in February.  Will be due for labs again this summer.

## 2023-03-16 NOTE — Assessment & Plan Note (Addendum)
A1c looks great today at 6.0.  She has really made some great changes and it is reflected in her A1c today she says her goal is to continue to get it down even further and come off of the metformin at some point.  She is also lost some weight which is fantastic.

## 2023-03-17 NOTE — Addendum Note (Signed)
Addended by: Beatrice Lecher D on: 03/17/2023 08:13 AM   Modules accepted: Level of Service

## 2023-03-18 DIAGNOSIS — M25561 Pain in right knee: Secondary | ICD-10-CM | POA: Diagnosis not present

## 2023-03-18 DIAGNOSIS — M6281 Muscle weakness (generalized): Secondary | ICD-10-CM | POA: Diagnosis not present

## 2023-03-22 ENCOUNTER — Ambulatory Visit (INDEPENDENT_AMBULATORY_CARE_PROVIDER_SITE_OTHER): Payer: Medicare Other | Admitting: Family Medicine

## 2023-03-22 DIAGNOSIS — Z780 Asymptomatic menopausal state: Secondary | ICD-10-CM

## 2023-03-22 DIAGNOSIS — Z0000 Encounter for general adult medical examination without abnormal findings: Secondary | ICD-10-CM

## 2023-03-22 DIAGNOSIS — Z1231 Encounter for screening mammogram for malignant neoplasm of breast: Secondary | ICD-10-CM

## 2023-03-22 NOTE — Progress Notes (Signed)
MEDICARE ANNUAL WELLNESS VISIT  03/22/2023  Telephone Visit Disclaimer This Medicare AWV was conducted by telephone due to national recommendations for restrictions regarding the COVID-19 Pandemic (e.g. social distancing).  I verified, using two identifiers, that I am speaking with Yvonne Long or their authorized healthcare agent. I discussed the limitations, risks, security, and privacy concerns of performing an evaluation and management service by telephone and the potential availability of an in-person appointment in the future. The patient expressed understanding and agreed to proceed.  Location of Patient: Home Location of Provider (nurse):  Provider home  Subjective:    Yvonne Long is a 72 y.o. female patient of Metheney, Rene Kocher, MD who had a Medicare Annual Wellness Visit today via telephone. Yvonne Long is Working part time and lives with their spouse. she has 2 children. she reports that she is socially active and does interact with friends/family regularly. she is minimally physically active and enjoys yard work and yoga.  Patient Care Team: Hali Marry, MD as PCP - General (Family Medicine) Park Liter, MD as PCP - Cardiology (Cardiology) Bettina Gavia, [place] as Referring Physician (Optometry)     03/22/2023   10:08 AM 02/03/2023    1:46 PM 01/21/2023   11:22 AM 01/11/2023   11:32 AM 03/16/2022    9:57 AM 03/12/2021   10:00 AM 10/15/2019   11:53 AM  Advanced Directives  Does Patient Have a Medical Advance Directive? Yes Yes No No Yes Yes No  Type of Advance Directive Living will Healthcare Power of Attorney   Living will [place];Living will   Does patient want to make changes to medical advance directive? No - Patient declined No - Patient declined   No - Patient declined No - Patient declined   Copy of [place] in Chart?  No - copy requested    No - copy requested   Would patient like information  on creating a medical advance directive?   No - Patient declined No - Patient declined   No - Patient declined    Hospital Utilization Over the Past 12 Months: # of hospitalizations or ER visits: 1 # of surgeries: 1  Review of Systems    Patient reports that her overall health is worse compared to last year.  History obtained from chart review and the patient  Patient Reported Readings (BP, Pulse, CBG, Weight, etc) none  Pain Assessment Pain : 0-10 Pain Score: 7  Pain Type: Chronic pain Pain Location: Generalized Pain Descriptors / Indicators: Constant Pain Onset: More than a month ago Pain Frequency: Constant Pain Relieving Factors: tylenol  Pain Relieving Factors: tylenol  Current Medications & Allergies (verified) Allergies as of 03/22/2023   No Active Allergies      Medication List        Accurate as of March 22, 2023 10:27 AM. If you have any questions, ask your nurse or doctor.          acetaminophen 500 MG tablet Commonly known as: TYLENOL Take 1,000 mg by mouth every 6 (six) hours as needed for moderate pain.   AMBULATORY NON FORMULARY MEDICATION Medication Name: CPAP with nasal pillows and humidifier.  Set to AutoPap 4-20 cm watere pressure setting for 10 days and then please fax his download so that we can set her pressure.  New diagnosis of obstructive sleep apnea with AHI of 14.2.  Was faxed to [facility]   amLODipine 5 MG tablet Commonly known as: NORVASC Take  1 tablet (5 mg total) by mouth daily.   atorvastatin 20 MG tablet Commonly known as: LIPITOR Take 1 tablet (20 mg total) by mouth daily.   b complex vitamins capsule Take 1 capsule by mouth daily.   BIOTIN PO Take 1 tablet by mouth daily.   Glucosamine HCl 1500 MG Tabs Take 1 tablet by mouth daily.   Magnesium 400 MG Tabs Take 400 mg by mouth daily.   Melatonin 10 MG Tabs Take 10 mg by mouth at bedtime.   metFORMIN 500 MG tablet Commonly known as: GLUCOPHAGE Take 1 tablet  (500 mg total) by mouth 2 (two) times daily with a meal.   PARoxetine 30 MG tablet Commonly known as: PAXIL Take 1 tablet (30 mg total) by mouth daily. Needs a follow up appointment.   VISINE OP Place 1 drop into both eyes daily.   Vitamin D3 25 MCG (1000 UT) capsule Generic drug: Cholecalciferol Take 1,000 Int'l Units by mouth.        History (reviewed): Past Medical History:  Diagnosis Date   Allergy    Pollen, dust   Anxiety    Perform exercise to control   Cataract    Will probably have them removed soon.   Chronic kidney disease (CKD) stage G3b/A1, moderately decreased glomerular filtration rate (GFR) between 30-44 mL/min/1.73 square meter and albuminuria creatinine ratio less than 30 mg/g 03/19/2016   COVID 04/10/2022   Depression    Diabetes mellitus without complication    Encounter for weight management 08/12/2022   GERD (gastroesophageal reflux disease)    Herpes simplex infection 07/17/2019   Oral and genital   Hyperlipidemia 07/24/2008   Qualifier: Diagnosis of  By: Edman Circle RN, Paula     Hypertension    Take meds to control   Left knee pain 11/20/2015   Major depressive disorder, recurrent episode 09/28/2006   Qualifier: Diagnosis of  By: Madilyn Fireman MD, Catherine     OSA (obstructive sleep apnea) 11/11/2018   Study 10/2018 IMPRESSIONS - Mild obstructive sleep apnea occurred during this study (AHI = 14.2/h). - No significant central sleep apnea occurred during this study (CAI = 0.0/h). - Oxygen desaturation was noted during this study (Min O2 = 85%). - Patient snored.   Palpitations 07/05/2007   Qualifier: Diagnosis of  By: Madilyn Fireman MD, Barnetta Chapel     Primary localized osteoarthritis of left knee 11/02/2018   Sleep apnea    Use CPAP   Past Surgical History:  Procedure Laterality Date   ABDOMINAL HYSTERECTOMY  2005   Dr. Toy Cookey   BILATERAL SALPINGOOPHORECTOMY  2005   Dr. Noralee Stain   CHOLECYSTECTOMY  2013   [place]  2005   with bladder tack    JOINT REPLACEMENT  2021   Left knee   SPINE SURGERY  01/2010   Lumbar Laminectomy-Dr. Owens Shark   TOTAL KNEE ARTHROPLASTY Left 11/14/2018   Procedure: left TOTAL KNEE ARTHROPLASTY;  Surgeon: Elsie Saas, MD;  Location: [facility];  Service: Orthopedics;  Laterality: Left;   TOTAL KNEE ARTHROPLASTY Right 02/03/2023   Procedure: TOTAL KNEE ARTHROPLASTY;  Surgeon: Willaim Sheng, MD;  Location: [facility];  Service: Orthopedics;  Laterality: Right;   Family History  Problem Relation Age of Onset   Alcohol abuse Father    Depression Father    Peripheral vascular disease Father    Thrombosis Father        deceased from thrombosis of leg.   Anxiety disorder Father    Heart attack Other  Hypertension Mother    Hearing loss Mother    Cancer Other        Brain cancer   Stroke Other    Depression Son    Anxiety disorder Son    Drug abuse Son    Heart disease Maternal Grandfather    Arthritis Maternal Grandmother    Cancer Maternal Grandmother    Heart disease Paternal Grandfather    Arthritis Paternal Grandmother    Anxiety disorder Sister    Depression Sister    Obesity Sister    Depression Sister    Obesity Sister    Social History   Socioeconomic History   Marital status: Married    Spouse name: Agricultural consultant   Number of children: 2   Years of education: 10   Highest education level: Conservator, museum/gallery (e.g., MA, MS, MEng, MEd, MSW, MBA)  Occupational History   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: [employer]    Comment: Retired   Occupation: Freight forwarder    Comment: Part-time  Tobacco Use   Smoking status: Former    Packs/day: 1.00    Years: 15.00    Additional pack years: 0.00    Total pack years: 15.00    Types: Cigarettes    Quit date: 12/21/1994    Years since quitting: 28.2   Smokeless tobacco: Never  Vaping Use   Vaping Use: Never used  Substance and Sexual Activity   Alcohol use: Yes    Alcohol/week: 7.0 standard drinks of alcohol    Types: 7 Glasses  of wine per week    Comment: 1 alcoholic drink per day   Drug use: No   Sexual activity: Not Currently    Birth control/protection: Post-menopausal  Other Topics Concern   Not on file  Social History Narrative   Lives with her husband. Enjoys yard work and yoga.    Social Determinants of Health   Financial Resource Strain: Low Risk  (03/22/2023)   Overall Financial Resource Strain (CARDIA)    Difficulty of Paying Living Expenses: Not hard at all  Food Insecurity: No Food Insecurity (03/22/2023)   Hunger Vital Sign    Worried About Running Out of Food in the Last Year: Never true    Ran Out of Food in the Last Year: Never true  Transportation Needs: No Transportation Needs (03/22/2023)   PRAPARE - Hydrologist (Medical): No    Lack of Transportation (Non-Medical): No  Physical Activity: Inactive (03/22/2023)   Exercise Vital Sign    Days of Exercise per Week: 0 days    Minutes of Exercise per Session: 0 min  Stress: No Stress Concern Present (03/22/2023)   [place]    Feeling of Stress : Not at all  Social Connections: Moderately Integrated (03/22/2023)   Social Connection and Isolation Panel [NHANES]    Frequency of Communication with Friends and Family: More than three times a week    Frequency of Social Gatherings with Friends and Family: Three times a week    Attends Religious Services: More than 4 times per year    Active Member of Clubs or Organizations: No    Attends Archivist Meetings: Never    Marital Status: Married    Activities of Daily Living    03/22/2023   10:14 AM 02/03/2023    1:46 PM  In your present state of health, do you have any difficulty performing the following  activities:  Hearing? 0 0  Vision? 0 0  Difficulty concentrating or making decisions? 0 0  Walking or climbing stairs? 1 1  Comment recent knee replacement   Dressing or bathing? 0 0  Doing  errands, shopping? 0 0  Preparing Food and eating ? N   Using the Toilet? N   In the past six months, have you accidently leaked urine? N   Do you have problems with loss of bowel control? N   Managing your Medications? N   Managing your Finances? N   Housekeeping or managing your Housekeeping? N     Patient Education/ Literacy How often do you need to have someone help you when you read instructions, pamphlets, or other written materials from your doctor or pharmacy?: 1 - Never What is the last grade level you completed in school?: Masters degree  Exercise Current Exercise Habits: Home exercise routine, Type of exercise: yoga, Time (Minutes): 15, Frequency (Times/Week): 7, Weekly Exercise (Minutes/Week): 105, Intensity: Mild, Exercise limited by: orthopedic condition(s)  Diet Patient reports consuming  2-3  meals a day and 1 snack(s) a day Patient reports that her primary diet is: Regular Patient reports that she does have regular access to food.   Depression Screen    03/22/2023   10:08 AM 03/16/2023    5:09 PM 01/11/2023   11:32 AM 12/18/2022    3:34 PM 06/18/2022    2:33 PM 03/16/2022    9:57 AM 08/05/2021   11:38 AM  PHQ 2/9 Scores  PHQ - 2 Score 0 0 0 2 6 0 1  PHQ- 9 Score 6 6  8 13  0      Fall Risk    03/22/2023   10:08 AM 03/16/2023    4:17 PM 01/11/2023   11:32 AM 12/18/2022    3:34 PM 08/12/2022   10:59 AM  Fall Risk   Falls in the past year? 1 1 0 1 1  Number falls in past yr: 1 1  1 1   Injury with Fall? 1 1  1 1   Risk for fall due to : History of fall(s) History of fall(s)  No Fall Risks History of fall(s)  Follow up Falls evaluation completed;Education provided;Falls prevention discussed Falls evaluation completed  Falls evaluation completed Falls evaluation completed     Objective:  Yvonne Long seemed alert and oriented and she participated appropriately during our telephone visit.  Blood Pressure Weight BMI  BP Readings from Last 3 Encounters:   03/16/23 132/60  03/03/23 (!) 140/74  02/04/23 127/69   Wt Readings from Last 3 Encounters:  03/03/23 211 lb (95.7 kg)  02/03/23 213 lb 13.5 oz (97 kg)  01/21/23 213 lb (96.6 kg)   BMI Readings from Last 1 Encounters:  03/16/23 34.06 kg/m    *Unable to obtain current vital signs, weight, and BMI due to telephone visit type  Hearing/Vision  Yvonne Long did not seem to have difficulty with hearing/understanding during the telephone conversation Reports that she has not had a formal eye exam by an eye care professional within the past year Reports that she has not had a formal hearing evaluation within the past year *Unable to fully assess hearing and vision during telephone visit type  Cognitive Function:    03/22/2023   10:17 AM 03/16/2022   10:01 AM 03/12/2021   10:10 AM  6CIT Screen  What Year? 0 points 0 points 0 points  What month? 0 points 0 points 0 points  What time? 0 points  0 points 0 points  Count back from 20 0 points 0 points 0 points  Months in reverse 0 points 0 points 0 points  Repeat phrase 0 points 0 points 0 points  Total Score 0 points 0 points 0 points   (Normal:0-7, Significant for Dysfunction: >8)  Normal Cognitive Function Screening: Yes   Immunization & Health Maintenance Record Immunization History  Administered Date(s) Administered   Fluad Quad(high Dose 65+) 10/03/2019, 10/02/2020, 09/29/2021   Influenza Split 10/15/2014   Influenza Whole 10/04/2007, 09/11/2008, 11/25/2009, 09/22/2010   Influenza, High Dose Seasonal PF 10/13/2017, 09/15/2018   Influenza-Unspecified 09/20/2013, 09/24/2013, 09/11/2022   PFIZER Comirnaty(Gray Top)Covid-19 Tri-Sucrose Vaccine 09/11/2022   PFIZER(Purple Top)SARS-COV-2 Vaccination 03/02/2020, 03/26/2020, 12/01/2020   Pneumococcal Conjugate-13 04/02/2021   Pneumococcal Polysaccharide-23 01/26/2019   Tdap 12/04/2007, 01/26/2019   Zoster Recombinat (Shingrix) 08/16/2021, 10/28/2021   Zoster, Live 07/17/2015    Health  Maintenance  Topic Date Due   COVID-19 Vaccine (5 - 2023-24 season) 11/06/2022   OPHTHALMOLOGY EXAM  03/22/2023 (Originally 09/29/1961)   Diabetic kidney evaluation - Urine ACR  03/23/2023 (Originally 08/21/2016)   INFLUENZA VACCINE  07/22/2023   MAMMOGRAM  09/04/2023   HEMOGLOBIN A1C  09/16/2023   Diabetic kidney evaluation - eGFR measurement  02/05/2024   FOOT EXAM  03/15/2024   Medicare Annual Wellness (AWV)  03/21/2024   Fecal DNA (Cologuard)  06/29/2025   DTaP/Tdap/Td (3 - Td or Tdap) 01/26/2029   Pneumonia Vaccine 35+ Years old  Completed   DEXA SCAN  Completed   Hepatitis C Screening  Completed   Zoster Vaccines- Shingrix  Completed   HPV VACCINES  Aged Out       Assessment  This is a routine wellness examination for [employer].  Health Maintenance: Due or Overdue Health Maintenance Due  Topic Date Due   COVID-19 Vaccine (5 - 2023-24 season) 11/06/2022    Yvonne Long does not need a referral for Community Assistance: Care Management:   no Social Work:    no Prescription Assistance:  no Nutrition/Diabetes Education:  no   Plan:  Personalized Goals  Goals Addressed               This Visit's Progress     Patient Stated (pt-stated)        03/22/2023 AWV Goal: Diabetes Management  Patient will maintain an A1C level below 6.5 Patient will not develop any diabetic foot complications Patient will not experience any hypoglycemic episodes over the next 3 months Patient will notify our office of any CBG readings outside of the provider recommended range by calling [phone] Patient will adhere to provider recommendations for diabetes management  Patient Self Management Activities take all medications as prescribed and report any negative side effects monitor and record blood sugar readings as directed adhere to a low carbohydrate diet that incorporates lean proteins, vegetables, whole grains, low glycemic fruits check feet daily noting any  sores, cracks, injuries, or callous formations see PCP or podiatrist if she notices any changes in her legs, feet, or toenails Patient will visit PCP and have an A1C level checked every 3 to 6 months as directed  have a yearly eye exam to monitor for vascular changes associated with diabetes and will request that the report be sent to her pcp.  consult with her PCP regarding any changes in her health or new or worsening symptoms        Personalized Health Maintenance & Screening Recommendations  Screening mammography Bone densitometry screening Eye exam- Patient  is scheduled next week. Urine ACR  Lung Cancer Screening Recommended: no (Low Dose CT Chest recommended if Age 71-80 years, 30 pack-year currently smoking OR have quit w/in past 15 years) Hepatitis C Screening recommended: no HIV Screening recommended: no  Advanced Directives: Written information was not prepared per patient's request.  Referrals & Orders Orders Placed This Encounter  Procedures   [place] Follow-up with Hali Marry, MD as planned Please have your eye exam records faxed to our office.  Medicare wellness visit in one year.  Patient will access AVS on my chart.   I have personally reviewed and noted the following in the patient's chart:   Medical and social history Use of alcohol, tobacco or illicit drugs  Current medications and supplements Functional ability and status Nutritional status Physical activity Advanced directives List of other physicians Hospitalizations, surgeries, and ER visits in previous 12 months Vitals Screenings to include cognitive, depression, and falls Referrals and appointments  In addition, I have reviewed and discussed with Yvonne Long certain preventive protocols, quality metrics, and best practice recommendations. A written personalized care plan for preventive services as well as general  preventive health recommendations is available and can be mailed to the patient at her request.      Tinnie Gens, RN BSN  03/22/2023

## 2023-03-22 NOTE — Patient Instructions (Signed)
[place] Maintenance Summary and Written Plan of Care  Yvonne Long ,  Thank you for allowing me to perform your Medicare Annual Wellness Visit and for your ongoing commitment to your health.   Health Maintenance & Immunization History Health Maintenance  Topic Date Due   COVID-19 Vaccine (5 - 2023-24 season) 11/06/2022   OPHTHALMOLOGY EXAM  03/22/2023 (Originally 09/29/1961)   Diabetic kidney evaluation - Urine ACR  03/23/2023 (Originally 08/21/2016)   INFLUENZA VACCINE  07/22/2023   MAMMOGRAM  09/04/2023   HEMOGLOBIN A1C  09/16/2023   Diabetic kidney evaluation - eGFR measurement  02/05/2024   FOOT EXAM  03/15/2024   Medicare Annual Wellness (AWV)  03/21/2024   Fecal DNA (Cologuard)  06/29/2025   DTaP/Tdap/Td (3 - Td or Tdap) 01/26/2029   Pneumonia Vaccine 40+ Years old  Completed   DEXA SCAN  Completed   Hepatitis C Screening  Completed   Zoster Vaccines- Shingrix  Completed   HPV VACCINES  Aged Out   Immunization History  Administered Date(s) Administered   Fluad Quad(high Dose 65+) 10/03/2019, 10/02/2020, 09/29/2021   Influenza Split 10/15/2014   Influenza Whole 10/04/2007, 09/11/2008, 11/25/2009, 09/22/2010   Influenza, High Dose Seasonal PF 10/13/2017, 09/15/2018   Influenza-Unspecified 09/20/2013, 09/24/2013, 09/11/2022   PFIZER Comirnaty(Gray Top)Covid-19 Tri-Sucrose Vaccine 09/11/2022   PFIZER(Purple Top)SARS-COV-2 Vaccination 03/02/2020, 03/26/2020, 12/01/2020   Pneumococcal Conjugate-13 04/02/2021   Pneumococcal Polysaccharide-23 01/26/2019   Tdap 12/04/2007, 01/26/2019   Zoster Recombinat (Shingrix) 08/16/2021, 10/28/2021   Zoster, Live 07/17/2015    These are the patient goals that we discussed:  Goals Addressed               This Visit's Progress     Patient Stated (pt-stated)        03/22/2023 AWV Goal: Diabetes Management  Patient will maintain an A1C level below 6.5 Patient will not develop any diabetic foot  complications Patient will not experience any hypoglycemic episodes over the next 3 months Patient will notify our office of any CBG readings outside of the provider recommended range by calling [phone] Patient will adhere to provider recommendations for diabetes management  Patient Self Management Activities take all medications as prescribed and report any negative side effects monitor and record blood sugar readings as directed adhere to a low carbohydrate diet that incorporates lean proteins, vegetables, whole grains, low glycemic fruits check feet daily noting any sores, cracks, injuries, or callous formations see PCP or podiatrist if she notices any changes in her legs, feet, or toenails Patient will visit PCP and have an A1C level checked every 3 to 6 months as directed  have a yearly eye exam to monitor for vascular changes associated with diabetes and will request that the report be sent to her pcp.  consult with her PCP regarding any changes in her health or new or worsening symptoms          This is a list of Health Maintenance Items that are overdue or due now: Screening mammography Bone densitometry screening Eye exam- Patient is scheduled next week. Urine ACR  Orders/Referrals Placed Today: Orders Placed This Encounter  Procedures   DEXAScan    Standing Status:   Future    Standing Expiration Date:   03/21/2024    Scheduling Instructions:     Please call patient to schedule    Order Specific Question:   Reason for exam:    Answer:   post menopausal    Order Specific Question:   Preferred  imaging location?    Answer:   [facility]   Mammogram 3D SCREEN BREAST BILATERAL    Standing Status:   Future    Standing Expiration Date:   03/21/2024    Scheduling Instructions:     Please call patient to schedule. Last mammogram 09/03/22.    Order Specific Question:   Reason for Exam (SYMPTOM  OR DIAGNOSIS REQUIRED)    Answer:   breast cancer screening    Order  Specific Question:   Preferred imaging location?    Answer:   [facility]    (Contact our referral department at [phone] if you have not spoken with someone about your referral appointment within the next 5 days)    Follow-up Plan Follow-up with Hali Marry, MD as planned Please have your eye exam records faxed to our office.  Medicare wellness visit in one year.  Patient will access AVS on my chart.      Health Maintenance, Female Adopting a healthy lifestyle and getting preventive care are important in promoting health and wellness. Ask your health care provider about: The right schedule for you to have regular tests and exams. Things you can do on your own to prevent diseases and keep yourself healthy. What should I know about diet, weight, and exercise? Eat a healthy diet  Eat a diet that includes plenty of vegetables, fruits, low-fat dairy products, and lean protein. Do not eat a lot of foods that are high in solid fats, added sugars, or sodium. Maintain a healthy weight Body mass index (BMI) is used to identify weight problems. It estimates body fat based on height and weight. Your health care provider can help determine your BMI and help you achieve or maintain a healthy weight. Get regular exercise Get regular exercise. This is one of the most important things you can do for your health. Most adults should: Exercise for at least 150 minutes each week. The exercise should increase your heart rate and make you sweat (moderate-intensity exercise). Do strengthening exercises at least twice a week. This is in addition to the moderate-intensity exercise. Spend less time sitting. Even light physical activity can be beneficial. Watch cholesterol and blood lipids Have your blood tested for lipids and cholesterol at 71 years of age, then have this test every 5 years. Have your cholesterol levels checked more often if: Your lipid or cholesterol levels are  high. You are older than 71 years of age. You are at high risk for heart disease. What should I know about cancer screening? Depending on your health history and family history, you may need to have cancer screening at various ages. This may include screening for: Breast cancer. Cervical cancer. Colorectal cancer. Skin cancer. Lung cancer. What should I know about heart disease, diabetes, and high blood pressure? Blood pressure and heart disease High blood pressure causes heart disease and increases the risk of stroke. This is more likely to develop in people who have high blood pressure readings or are overweight. Have your blood pressure checked: Every 3-5 years if you are 49-46 years of age. Every year if you are 73 years old or older. Diabetes Have regular diabetes screenings. This checks your fasting blood sugar level. Have the screening done: Once every three years after age 2 if you are at a normal weight and have a low risk for diabetes. More often and at a younger age if you are overweight or have a high risk for diabetes. What should I know about preventing  infection? Hepatitis B If you have a higher risk for hepatitis B, you should be screened for this virus. Talk with your health care provider to find out if you are at risk for hepatitis B infection. Hepatitis C Testing is recommended for: Everyone born from [birth year] through 1965. Anyone with known risk factors for hepatitis C. Sexually transmitted infections (STIs) Get screened for STIs, including gonorrhea and chlamydia, if: You are sexually active and are younger than 71 years of age. You are older than 71 years of age and your health care provider tells you that you are at risk for this type of infection. Your sexual activity has changed since you were last screened, and you are at increased risk for chlamydia or gonorrhea. Ask your health care provider if you are at risk. Ask your health care provider about whether you  are at high risk for HIV. Your health care provider may recommend a prescription medicine to help prevent HIV infection. If you choose to take medicine to prevent HIV, you should first get tested for HIV. You should then be tested every 3 months for as long as you are taking the medicine. Pregnancy If you are about to stop having your period (premenopausal) and you may become pregnant, seek counseling before you get pregnant. Take 400 to 800 micrograms (mcg) of folic acid every day if you become pregnant. Ask for birth control (contraception) if you want to prevent pregnancy. Osteoporosis and menopause Osteoporosis is a disease in which the bones lose minerals and strength with aging. This can result in bone fractures. If you are 32 years old or older, or if you are at risk for osteoporosis and fractures, ask your health care provider if you should: Be screened for bone loss. Take a calcium or vitamin D supplement to lower your risk of fractures. Be given hormone replacement therapy (HRT) to treat symptoms of menopause. Follow these instructions at home: Alcohol use Do not drink alcohol if: Your health care provider tells you not to drink. You are pregnant, may be pregnant, or are planning to become pregnant. If you drink alcohol: Limit how much you have to: 0-1 drink a day. Know how much alcohol is in your drink. In the [place] one drink equals one 12 oz bottle of beer (355 mL), one 5 oz glass of wine (148 mL), or one 1 oz glass of hard liquor (44 mL). Lifestyle Do not use any products that contain nicotine or tobacco. These products include cigarettes, chewing tobacco, and vaping devices, such as e-cigarettes. If you need help quitting, ask your health care provider. Do not use street drugs. Do not share needles. Ask your health care provider for help if you need support or information about quitting drugs. General instructions Schedule regular health, dental, and eye exams. Stay current  with your vaccines. Tell your health care provider if: You often feel depressed. You have ever been abused or do not feel safe at home. Summary Adopting a healthy lifestyle and getting preventive care are important in promoting health and wellness. Follow your health care provider's instructions about healthy diet, exercising, and getting tested or screened for diseases. Follow your health care provider's instructions on monitoring your cholesterol and blood pressure. This information is not intended to replace advice given to you by your health care provider. Make sure you discuss any questions you have with your health care provider. Document Revised: 04/28/2021 Document Reviewed: 04/28/2021 Elsevier Patient Education  [place].

## 2023-03-23 DIAGNOSIS — M1711 Unilateral primary osteoarthritis, right knee: Secondary | ICD-10-CM | POA: Diagnosis not present

## 2023-03-23 DIAGNOSIS — M6281 Muscle weakness (generalized): Secondary | ICD-10-CM | POA: Diagnosis not present

## 2023-03-23 DIAGNOSIS — M25561 Pain in right knee: Secondary | ICD-10-CM | POA: Diagnosis not present

## 2023-03-25 ENCOUNTER — Ambulatory Visit: Payer: Medicare Other | Admitting: Family Medicine

## 2023-03-25 DIAGNOSIS — M6281 Muscle weakness (generalized): Secondary | ICD-10-CM | POA: Diagnosis not present

## 2023-03-25 DIAGNOSIS — M25561 Pain in right knee: Secondary | ICD-10-CM | POA: Diagnosis not present

## 2023-03-25 NOTE — Progress Notes (Deleted)
   Acute Office Visit  Subjective:     Patient ID: Yvonne Long, female    DOB: Nov 11, 1951, 72 y.o.   MRN: [id number]  No chief complaint on file.   HPI Patient is in today for ***  ROS      Objective:    There were no vitals taken for this visit. {Vitals History (Optional):23777}  Physical Exam  No results found for any visits on 03/25/23.      Assessment & Plan:   Problem List Items Addressed This Visit   None   No orders of the defined types were placed in this encounter.   No follow-ups on file.  Beatrice Lecher, MD

## 2023-04-14 ENCOUNTER — Other Ambulatory Visit: Payer: Self-pay | Admitting: Family Medicine

## 2023-04-14 DIAGNOSIS — M5416 Radiculopathy, lumbar region: Secondary | ICD-10-CM | POA: Diagnosis not present

## 2023-04-21 DIAGNOSIS — M5416 Radiculopathy, lumbar region: Secondary | ICD-10-CM | POA: Diagnosis not present

## 2023-04-30 DIAGNOSIS — H25813 Combined forms of age-related cataract, bilateral: Secondary | ICD-10-CM | POA: Diagnosis not present

## 2023-05-04 DIAGNOSIS — M1711 Unilateral primary osteoarthritis, right knee: Secondary | ICD-10-CM | POA: Diagnosis not present

## 2023-05-28 DIAGNOSIS — M545 Low back pain, unspecified: Secondary | ICD-10-CM | POA: Diagnosis not present

## 2023-06-02 DIAGNOSIS — H25812 Combined forms of age-related cataract, left eye: Secondary | ICD-10-CM | POA: Diagnosis not present

## 2023-06-02 DIAGNOSIS — H43822 Vitreomacular adhesion, left eye: Secondary | ICD-10-CM | POA: Diagnosis not present

## 2023-06-02 LAB — HM DIABETES EYE EXAM

## 2023-06-09 ENCOUNTER — Other Ambulatory Visit: Payer: Self-pay | Admitting: Family Medicine

## 2023-06-09 ENCOUNTER — Encounter: Payer: Self-pay | Admitting: Family Medicine

## 2023-06-11 DIAGNOSIS — L649 Androgenic alopecia, unspecified: Secondary | ICD-10-CM | POA: Diagnosis not present

## 2023-06-15 MED ORDER — OZEMPIC (0.25 OR 0.5 MG/DOSE) 2 MG/3ML ~~LOC~~ SOPN: 0.2500 mg | PEN_INJECTOR | SUBCUTANEOUS | 1 refills | Status: DC

## 2023-06-15 NOTE — Telephone Encounter (Signed)
Meds ordered this encounter  Medications   Semaglutide,0.25 or 0.5MG /DOS, (OZEMPIC, 0.25 OR 0.5 MG/DOSE,) 2 MG/3ML SOPN    Sig: Inject 0.25 mg into the skin once a week.    Dispense:  3 mL    Refill:  1   Sent to [facility]

## 2023-06-18 DIAGNOSIS — H25812 Combined forms of age-related cataract, left eye: Secondary | ICD-10-CM | POA: Diagnosis not present

## 2023-07-16 ENCOUNTER — Encounter: Payer: Self-pay | Admitting: Family Medicine

## 2023-07-16 ENCOUNTER — Ambulatory Visit: Payer: Medicare Other | Admitting: Family Medicine

## 2023-07-16 VITALS — BP 134/64 | HR 73 | Ht 66.0 in | Wt 205.0 lb

## 2023-07-16 DIAGNOSIS — I10 Essential (primary) hypertension: Secondary | ICD-10-CM | POA: Diagnosis not present

## 2023-07-16 DIAGNOSIS — E785 Hyperlipidemia, unspecified: Secondary | ICD-10-CM

## 2023-07-16 DIAGNOSIS — D692 Other nonthrombocytopenic purpura: Secondary | ICD-10-CM | POA: Diagnosis not present

## 2023-07-16 DIAGNOSIS — E1169 Type 2 diabetes mellitus with other specified complication: Secondary | ICD-10-CM

## 2023-07-16 DIAGNOSIS — N1832 Chronic kidney disease, stage 3b: Secondary | ICD-10-CM | POA: Diagnosis not present

## 2023-07-16 DIAGNOSIS — E669 Obesity, unspecified: Secondary | ICD-10-CM

## 2023-07-16 MED ORDER — ATORVASTATIN CALCIUM 20 MG PO TABS: 20.0000 mg | ORAL_TABLET | Freq: Every day | ORAL | 3 refills | Status: DC

## 2023-07-16 MED ORDER — OZEMPIC (0.25 OR 0.5 MG/DOSE) 2 MG/3ML ~~LOC~~ SOPN: 0.5000 mg | PEN_INJECTOR | SUBCUTANEOUS | 1 refills | Status: DC

## 2023-07-16 NOTE — Assessment & Plan Note (Signed)
C looks great today tolerating Ozempic well.  After 1 more injection she will actually go up to 0.5 mg.  New updated prescription sent to pharmacy for the neck strength.  Continue to work on [employer], portion control.  She has cut back significantly on her alcohol intake.  Follow-up in 8 weeks

## 2023-07-16 NOTE — Assessment & Plan Note (Signed)
Blood pressure is elevated today and was mildly elevated last time.  Will go ahead and increase amlodipine to 10 mg she was started on the medication more for palpitations and she is still getting a few intermittent palpitations I think going up on the medication would work well.  Monitor for any increased lower extremity edema.

## 2023-07-16 NOTE — Progress Notes (Unsigned)
Established Patient Office Visit  Subjective   Patient ID: Yvonne Long, female    DOB: 04/15/1951  Age: 72 y.o. MRN: [id number]  Chief Complaint  Patient presents with   Diabetes    HPI  Diabetes - no hypoglycemic events. No wounds or sores that are not healing well. No increased thirst or urination. Checking glucose at home. Taking medications as prescribed without any side effects.  She is on her third of Ozempic 0.25 mg and so far she is actually tolerating it well.  She says the first week was a little bit rough but then it just got better.  F/U CKD 3 - no recent changes. Due to recheck renal function.   Hyperlipidemia - due for refills on statin.  Tolerating well.   Been under little bit more stress recently her son and grandchildren moved to [place] so she is missing and it has been a bit of adjustment she has been worrying more about and said her anxiety has gone up just a little bit.  She still happy with her Paxil dose and would like to continue her current regimen.  {History (Optional):23778}  ROS    Objective:     BP 134/64   Pulse 73   Ht 5\' 6"  (1.676 m)   Wt 205 lb (93 kg)   SpO2 94%   BMI 33.09 kg/m  {Vitals History (Optional):23777}  Physical Exam Vitals and nursing note reviewed.  Constitutional:      Appearance: She is well-developed.  HENT:     Head: Normocephalic and atraumatic.  Cardiovascular:     Rate and Rhythm: Normal rate and regular rhythm.     Heart sounds: Normal heart sounds.  Pulmonary:     Effort: Pulmonary effort is normal.     Breath sounds: Normal breath sounds.  Skin:    General: Skin is warm and dry.  Neurological:     Mental Status: She is alert and oriented to person, place, and time.  Psychiatric:        Behavior: Behavior normal.      No results found for any visits on 07/16/23.  {Labs (Optional):23779}  The 10-year ASCVD risk score (Arnett DK, et al., 2019) is: 21.8%    Assessment & Plan:    Problem List Items Addressed This Visit       Cardiovascular and Mediastinum   Senile purpura (HCC)    She gets a lot of bruising on her forearms and legs.  Stable.      Relevant Medications   atorvastatin (LIPITOR) 20 MG tablet   Essential hypertension    Blood pressure is elevated today and was mildly elevated last time.  Will go ahead and increase amlodipine to 10 mg she was started on the medication more for palpitations and she is still getting a few intermittent palpitations I think going up on the medication would work well.  Monitor for any increased lower extremity edema.      Relevant Medications   atorvastatin (LIPITOR) 20 MG tablet     Endocrine   Type 2 diabetes mellitus with obesity (HCC)    C looks great today tolerating Ozempic well.  After 1 more injection she will actually go up to 0.5 mg.  New updated prescription sent to pharmacy for the neck strength.  Continue to work on [employer], portion control.  She has cut back significantly on her alcohol intake.  Follow-up in 8 weeks      Relevant Medications  atorvastatin (LIPITOR) 20 MG tablet   Semaglutide,0.25 or 0.5MG /DOS, (OZEMPIC, 0.25 OR 0.5 MG/DOSE,) 2 MG/3ML SOPN   Other Relevant Orders   CMP14+EGFR   Lipid panel   Urine Microalbumin w/creat. ratio   POCT glycosylated hemoglobin (Hb A1C)     Genitourinary   Chronic kidney disease (CKD) stage G3b/A1, moderately decreased glomerular filtration rate (GFR) between 30-44 mL/min/1.73 square meter and albuminuria creatinine ratio less than 30 mg/g (HCC) - Primary   Relevant Orders   CMP14+EGFR   Lipid panel   Urine Microalbumin w/creat. ratio     Other   Hyperlipidemia   Relevant Medications   atorvastatin (LIPITOR) 20 MG tablet   Other Relevant Orders   CMP14+EGFR   Lipid panel   Urine Microalbumin w/creat. ratio    Return in about 3 months (around 10/16/2023) for Diabetes follow-up.    Nani Gasser, MD

## 2023-07-16 NOTE — Assessment & Plan Note (Signed)
She gets a lot of bruising on her forearms and legs.  Stable.

## 2023-07-20 NOTE — Assessment & Plan Note (Signed)
Renal function every months.  Due for urine microalbumin screening.

## 2023-07-20 NOTE — Progress Notes (Signed)
Your lab work is within acceptable range and there are no concerning findings.   ?

## 2023-07-23 ENCOUNTER — Encounter: Payer: Self-pay | Admitting: Family Medicine

## 2023-08-20 ENCOUNTER — Other Ambulatory Visit: Payer: Self-pay | Admitting: Family Medicine

## 2023-08-20 DIAGNOSIS — E1169 Type 2 diabetes mellitus with other specified complication: Secondary | ICD-10-CM

## 2023-08-20 DIAGNOSIS — E785 Hyperlipidemia, unspecified: Secondary | ICD-10-CM

## 2023-08-23 ENCOUNTER — Other Ambulatory Visit: Payer: Self-pay | Admitting: Family Medicine

## 2023-08-24 ENCOUNTER — Ambulatory Visit (INDEPENDENT_AMBULATORY_CARE_PROVIDER_SITE_OTHER): Payer: Medicare Other | Admitting: Family Medicine

## 2023-08-24 ENCOUNTER — Encounter: Payer: Self-pay | Admitting: Family Medicine

## 2023-08-24 ENCOUNTER — Ambulatory Visit: Payer: Medicare Other | Admitting: Family Medicine

## 2023-08-24 VITALS — BP 133/65 | HR 78 | Ht 66.0 in | Wt 207.0 lb

## 2023-08-24 DIAGNOSIS — M545 Low back pain, unspecified: Secondary | ICD-10-CM | POA: Diagnosis not present

## 2023-08-24 LAB — POCT URINALYSIS DIP (CLINITEK)
Bilirubin, UA: NEGATIVE
Blood, UA: NEGATIVE
Glucose, UA: NEGATIVE mg/dL
Ketones, POC UA: NEGATIVE mg/dL
Nitrite, UA: NEGATIVE
POC PROTEIN,UA: NEGATIVE
Spec Grav, UA: 1.02 (ref 1.010–1.025)
Urobilinogen, UA: 0.2 U/dL
pH, UA: 6.5 (ref 5.0–8.0)

## 2023-08-24 NOTE — Patient Instructions (Signed)
Not improving over the next several days then please let us know try to do some stretches, heating pad and/or ice whichever feels better as well as her Tylenol. If not improving and the pain is still wrapping around we can always get an x-ray if needed.

## 2023-08-24 NOTE — Progress Notes (Signed)
Acute Office Visit  Subjective:     Patient ID: Yvonne Long, female    DOB: 11-Nov-1951, 72 y.o.   MRN: [id number]  Chief Complaint  Patient presents with   Back Pain    HPI Patient is in today for 3 days of right sided low back pain.  She had gone to the beach with 3 lady friends last week.  They went up and down the stairs multiple times as it was not on the bottom floor and then she had a long car ride home but the next day she started having right sided mid back pain that was radiating around to her lateral ribs.  She has not had any dysuria or hematuria no prior history of kidney stones.  But has had similar pain before when she was having her urinary tract issue.  She has been using some Tylenol that has provided temporary relief.  She describes the pain is more of a dull ache.  She says the other day her blood pressure looked great at 126/75 for her doctor's appointment.  Last A1c is at goal.  ROS      Objective:    BP 133/65   Pulse 78   Ht 5\' 6"  (1.676 m)   Wt 207 lb (93.9 kg)   SpO2 99%   BMI 33.41 kg/m    Physical Exam Vitals reviewed.  Constitutional:      Appearance: Normal appearance.  HENT:     Head: Normocephalic.  Pulmonary:     Effort: Pulmonary effort is normal.  Musculoskeletal:     Comments: Nontender directly over the spine she does have some tenderness over that right lower thoracic spine radiating to the right side it is tender to palpation.  Nontender over the SI joints.  Some pain with flexion no pain with extension.  Pain on the right side with rotation to the left and sidebending to the right.  Neurological:     Mental Status: She is alert and oriented to person, place, and time.  Psychiatric:        Mood and Affect: Mood normal.        Behavior: Behavior normal.     Results for orders placed or performed in visit on 08/24/23  POCT URINALYSIS DIP (CLINITEK)  Result Value Ref Range   Color, UA yellow yellow   Clarity, UA  clear clear   Glucose, UA negative negative mg/dL   Bilirubin, UA negative negative   Ketones, POC UA negative negative mg/dL   Spec Grav, UA 0.865 7.846 - 1.025   Blood, UA negative negative   pH, UA 6.5 5.0 - 8.0   POC PROTEIN,UA negative negative, trace   Urobilinogen, UA 0.2 0.2 or 1.0 E.U./dL   Nitrite, UA Negative Negative   Leukocytes, UA Moderate (2+) (A) Negative        Assessment & Plan:   Problem List Items Addressed This Visit   None Visit Diagnoses     Acute right-sided low back pain, unspecified whether sciatica present    -  Primary   Relevant Orders   POCT URINALYSIS DIP (CLINITEK) (Completed)       Right sided low to mid back pain.  It is just above the belt line.  We discussed treatment with gentle stretches.  Offered to give her handout with some exercises but she says she already has 1 at home.  Okay to use Tylenol and heating pad.  If not improving then please let us know urinalysis  just showed moderate leukocytes so we will send for culture.  She has been drinking cranberry juice.  Not improving over the next several days then please let us know try to do some stretches, heating pad and/or ice whichever feels better as well as her Tylenol. If not improving and the pain is still wrapping around we can always get an x-ray if needed.  No orders of the defined types were placed in this encounter.   No follow-ups on file.  Nani Gasser, MD

## 2023-09-02 ENCOUNTER — Encounter: Payer: Self-pay | Admitting: Family Medicine

## 2023-09-02 DIAGNOSIS — N3281 Overactive bladder: Secondary | ICD-10-CM

## 2023-09-02 MED ORDER — SOLIFENACIN SUCCINATE 10 MG PO TABS: 10.0000 mg | ORAL_TABLET | Freq: Every day | ORAL | 3 refills | Status: DC

## 2023-09-10 DIAGNOSIS — H25811 Combined forms of age-related cataract, right eye: Secondary | ICD-10-CM | POA: Diagnosis not present

## 2023-10-18 ENCOUNTER — Ambulatory Visit (INDEPENDENT_AMBULATORY_CARE_PROVIDER_SITE_OTHER): Payer: Medicare Other | Admitting: Family Medicine

## 2023-10-18 ENCOUNTER — Encounter: Payer: Self-pay | Admitting: Family Medicine

## 2023-10-18 VITALS — BP 128/53 | HR 60 | Ht 66.0 in | Wt 211.0 lb

## 2023-10-18 DIAGNOSIS — E669 Obesity, unspecified: Secondary | ICD-10-CM

## 2023-10-18 DIAGNOSIS — F330 Major depressive disorder, recurrent, mild: Secondary | ICD-10-CM

## 2023-10-18 DIAGNOSIS — E1169 Type 2 diabetes mellitus with other specified complication: Secondary | ICD-10-CM | POA: Diagnosis not present

## 2023-10-18 DIAGNOSIS — N1832 Chronic kidney disease, stage 3b: Secondary | ICD-10-CM | POA: Diagnosis not present

## 2023-10-18 DIAGNOSIS — I10 Essential (primary) hypertension: Secondary | ICD-10-CM | POA: Diagnosis not present

## 2023-10-18 DIAGNOSIS — J22 Unspecified acute lower respiratory infection: Secondary | ICD-10-CM | POA: Diagnosis not present

## 2023-10-18 LAB — POCT GLYCOSYLATED HEMOGLOBIN (HGB A1C)
HbA1c, POC (prediabetic range): 6.2 % (ref 5.7–6.4)
Hemoglobin A1C: 6.2 % — AB (ref 4.0–5.6)

## 2023-10-18 MED ORDER — PAROXETINE HCL 30 MG PO TABS: 30.0000 mg | ORAL_TABLET | Freq: Every day | ORAL | 3 refills | Status: DC

## 2023-10-18 NOTE — Assessment & Plan Note (Signed)
Blood pressure looks great today.  Continue current regimen.

## 2023-10-18 NOTE — Assessment & Plan Note (Signed)
A1C looks great at 6.2.  It is up a little bit from prior at 6.0 so just continue to work on healthy diet and regular exercise especially is going through the holidays but otherwise follow-up in about 4 months.

## 2023-10-18 NOTE — Assessment & Plan Note (Signed)
Happy with current regimen on Paxil.  Will need refills sent to pharmacy.

## 2023-10-18 NOTE — Progress Notes (Signed)
Established Patient Office Visit  Subjective   Patient ID: Yvonne Long, female    DOB: Dec 18, 1951  Age: 72 y.o. MRN: [id number]  Chief Complaint  Patient presents with   Diabetes    HPI  Diabetes - no hypoglycemic events. No wounds or sores that are not healing well. No increased thirst or urination. Checking glucose at home. Taking medications as prescribed without any side effects.  Hypertension- Pt denies chest pain, SOB, dizziness, or heart palpitations.  Taking meds as directed w/o problems.  Denies medication side effects.    Also reports 4 to 5 days of some chest congestion and cough.  No sinus symptoms.  No fever chills or sweats.  No sore throat.  She has been using just some over-the-counter cough medicine just feels tired.  She did do a home COVID test and it was negative.  She says her mammogram has been scheduled.    ROS    Objective:     BP (!) 128/53   Pulse 60   Ht 5\' 6"  (1.676 m)   Wt 211 lb (95.7 kg)   SpO2 97%   BMI 34.06 kg/m    Physical Exam Vitals and nursing note reviewed.  Constitutional:      Appearance: Normal appearance.  HENT:     Head: Normocephalic and atraumatic.     Right Ear: Tympanic membrane, ear canal and external ear normal. There is no impacted cerumen.     Left Ear: Tympanic membrane, ear canal and external ear normal. There is no impacted cerumen.     Nose: Nose normal.     Mouth/Throat:     Pharynx: Oropharynx is clear.  Eyes:     Conjunctiva/sclera: Conjunctivae normal.  Cardiovascular:     Rate and Rhythm: Normal rate and regular rhythm.  Pulmonary:     Effort: Pulmonary effort is normal.     Breath sounds: Normal breath sounds.  Musculoskeletal:     Cervical back: Neck supple. No tenderness.  Lymphadenopathy:     Cervical: No cervical adenopathy.  Skin:    General: Skin is warm and dry.  Neurological:     Mental Status: She is alert and oriented to person, place, and time.  Psychiatric:         Mood and Affect: Mood normal.      Results for orders placed or performed in visit on 10/18/23  POCT HgB A1C  Result Value Ref Range   Hemoglobin A1C 6.7 (A) 4.0 - 5.6 %   HbA1c POC (<> result, manual entry)     HbA1c, POC (prediabetic range)     HbA1c, POC (controlled diabetic range)        The 10-year ASCVD risk score (Arnett DK, et al., 2019) is: 23.3%    Assessment & Plan:   Problem List Items Addressed This Visit       Cardiovascular and Mediastinum   Essential hypertension    Blood pressure looks great today.  Continue current regimen.        Endocrine   Type 2 diabetes mellitus with obesity (HCC) - Primary    A1C looks great at 6.2.  It is up a little bit from prior at 6.0 so just continue to work on healthy diet and regular exercise especially is going through the holidays but otherwise follow-up in about 4 months.      Relevant Orders   POCT HgB A1C (Completed)     Genitourinary   Chronic kidney disease (CKD) stage G3b/A1,  moderately decreased glomerular filtration rate (GFR) between 30-44 mL/min/1.73 square meter and albuminuria creatinine ratio less than 30 mg/g (HCC)    Sinew to follow renal function every 6 months.  Last serum creatinine was 0.8 which looked great.        Other   Major depressive disorder, recurrent episode (HCC)    Happy with current regimen on Paxil.  Will need refills sent to pharmacy.      Relevant Medications   PARoxetine (PAXIL) 30 MG tablet   Other Visit Diagnoses     Lower resp. tract infection          Lower respiratory tract infection-lungs are clear on exam symptoms for 4 to 5 days if not better by the end of the week please let us know or if develops worsening cough fevers or chills then please call sooner.  Return in about 23 weeks (around 03/27/2024) for Hypertension, Diabetes follow-up.    Nani Gasser, MD

## 2023-10-18 NOTE — Assessment & Plan Note (Signed)
Sinew to follow renal function every 6 months.  Last serum creatinine was 0.8 which looked great.

## 2023-10-19 NOTE — Telephone Encounter (Signed)
Is there a way to change the POC A1C result from yesterday to 6.2?  I couldn't figure out how to change it.

## 2023-10-21 ENCOUNTER — Encounter: Payer: Self-pay | Admitting: Family Medicine

## 2023-10-22 MED ORDER — TIRZEPATIDE 2.5 MG/0.5ML ~~LOC~~ SOAJ: 2.5000 mg | SUBCUTANEOUS | 0 refills | Status: DC

## 2023-10-25 MED ORDER — TIRZEPATIDE 2.5 MG/0.5ML ~~LOC~~ SOAJ: 2.5000 mg | SUBCUTANEOUS | 0 refills | Status: DC

## 2023-10-25 MED ORDER — ONDANSETRON HCL 4 MG PO TABS: 4.0000 mg | ORAL_TABLET | Freq: Three times a day (TID) | ORAL | 0 refills | Status: DC | PRN

## 2023-10-25 NOTE — Addendum Note (Signed)
Addended by: Chalmers Cater on: 10/25/2023 10:50 AM   Modules accepted: Orders

## 2023-10-25 NOTE — Addendum Note (Signed)
Addended by: Nani Gasser D on: 10/25/2023 12:52 PM   Modules accepted: Orders

## 2023-10-25 NOTE — Telephone Encounter (Signed)
Sent zofran to [facility]  Meds ordered this encounter  Medications   DISCONTD: tirzepatide (MOUNJARO) 2.5 MG/0.5ML Pen    Sig: Inject 2.5 mg into the skin once a week.    Dispense:  2 mL    Refill:  0   tirzepatide (MOUNJARO) 2.5 MG/0.5ML Pen    Sig: Inject 2.5 mg into the skin once a week.    Dispense:  2 mL    Refill:  0   ondansetron (ZOFRAN) 4 MG tablet    Sig: Take 1 tablet (4 mg total) by mouth every 8 (eight) hours as needed for nausea or vomiting.    Dispense:  20 tablet    Refill:  0

## 2023-10-29 ENCOUNTER — Other Ambulatory Visit: Payer: Self-pay | Admitting: Family Medicine

## 2023-11-19 ENCOUNTER — Other Ambulatory Visit: Payer: Self-pay | Admitting: Family Medicine

## 2023-11-19 DIAGNOSIS — E1169 Type 2 diabetes mellitus with other specified complication: Secondary | ICD-10-CM

## 2023-11-19 DIAGNOSIS — E785 Hyperlipidemia, unspecified: Secondary | ICD-10-CM

## 2023-11-25 ENCOUNTER — Other Ambulatory Visit: Payer: Self-pay | Admitting: Family Medicine

## 2023-12-15 ENCOUNTER — Other Ambulatory Visit: Payer: Self-pay | Admitting: Family Medicine

## 2023-12-17 NOTE — Telephone Encounter (Signed)
Copied from CRM [id number]. Topic: Clinical - Medication Question >> Dec 17, 2023  2:04 PM Dimitri Ped wrote: Reason for CRM: patient received a call from Dr concerning some medication that One of the pharmacist requesting a refill and dr wanted to talk to patient about it .[phone] call back number for patient

## 2024-01-02 ENCOUNTER — Other Ambulatory Visit: Payer: Self-pay | Admitting: Family Medicine

## 2024-01-21 DIAGNOSIS — M545 Low back pain, unspecified: Secondary | ICD-10-CM | POA: Diagnosis not present

## 2024-02-20 ENCOUNTER — Encounter: Payer: Self-pay | Admitting: Cardiology

## 2024-03-09 DIAGNOSIS — L649 Androgenic alopecia, unspecified: Secondary | ICD-10-CM | POA: Diagnosis not present

## 2024-03-09 DIAGNOSIS — L821 Other seborrheic keratosis: Secondary | ICD-10-CM | POA: Diagnosis not present

## 2024-03-09 DIAGNOSIS — D485 Neoplasm of uncertain behavior of skin: Secondary | ICD-10-CM | POA: Diagnosis not present

## 2024-03-09 DIAGNOSIS — L570 Actinic keratosis: Secondary | ICD-10-CM | POA: Diagnosis not present

## 2024-03-22 ENCOUNTER — Ambulatory Visit: Payer: Medicare Other

## 2024-03-22 VITALS — Ht 66.0 in | Wt 210.0 lb

## 2024-03-22 DIAGNOSIS — Z0000 Encounter for general adult medical examination without abnormal findings: Secondary | ICD-10-CM | POA: Diagnosis not present

## 2024-03-22 NOTE — Patient Instructions (Signed)
 Ms. Kopf , Thank you for taking time to come for your Medicare Wellness Visit. I appreciate your ongoing commitment to your health goals. Please review the following plan we discussed and let me know if I can assist you in the future.   These are the goals we discussed:  Goals       Activity and Exercise Increased      She would like to increase exercise.       Patient Stated (pt-stated)      03/12/2021 AWV Goal: Exercise for General Health  Patient will verbalize understanding of the benefits of increased physical activity: Exercising regularly is important. It will improve your overall fitness, flexibility, and endurance. Regular exercise also will improve your overall health. It can help you control your weight, reduce stress, and improve your bone density. Over the next year, patient will increase physical activity as tolerated with a goal of at least 150 minutes of moderate physical activity per week.  You can tell that you are exercising at a moderate intensity if your heart starts beating faster and you start breathing faster but can still hold a conversation. Moderate-intensity exercise ideas include: Walking 1 mile (1.6 km) in about 15 minutes Biking Hiking Golfing Dancing Water aerobics Patient will verbalize understanding of everyday activities that increase physical activity by providing examples like the following: Yard work, such as: Insurance underwriter Gardening Washing windows or floors Patient will be able to explain general safety guidelines for exercising:  Before you start a new exercise program, talk with your health care provider. Do not exercise so much that you hurt yourself, feel dizzy, or get very short of breath. Wear comfortable clothes and wear shoes with good support. Drink plenty of water while you exercise to prevent dehydration or heat stroke. Work out until  your breathing and your heartbeat get faster.       Patient Stated (pt-stated)      Patient would like to loose 50 lbs.      Patient Stated (pt-stated)      03/22/2023 AWV Goal: Diabetes Management  Patient will maintain an A1C level below 6.5 Patient will not develop any diabetic foot complications Patient will not experience any hypoglycemic episodes over the next 3 months Patient will notify our office of any CBG readings outside of the provider recommended range by calling [phone] Patient will adhere to provider recommendations for diabetes management  Patient Self Management Activities take all medications as prescribed and report any negative side effects monitor and record blood sugar readings as directed adhere to a low carbohydrate diet that incorporates lean proteins, vegetables, whole grains, low glycemic fruits check feet daily noting any sores, cracks, injuries, or callous formations see PCP or podiatrist if she notices any changes in her legs, feet, or toenails Patient will visit PCP and have an A1C level checked every 3 to 6 months as directed  have a yearly eye exam to monitor for vascular changes associated with diabetes and will request that the report be sent to her pcp.  consult with her PCP regarding any changes in her health or new or worsening symptoms         This is a list of the screening recommended for you and due dates:  Health Maintenance  Topic Date Due   Mammogram  09/04/2023   COVID-19 Vaccine (6 - 2024-25 season) 02/24/2024   Complete foot exam  03/15/2024   Hemoglobin A1C  04/17/2024   Eye exam for diabetics  06/01/2024   Yearly kidney function blood test for diabetes  07/15/2024   Yearly kidney health urinalysis for diabetes  07/15/2024   Flu Shot  07/21/2024   Medicare Annual Wellness Visit  03/22/2025   Cologuard (Stool DNA test)  06/29/2025   DTaP/Tdap/Td vaccine (3 - Td or Tdap) 01/26/2029   Pneumonia Vaccine  Completed   DEXA  scan (bone density measurement)  Completed   Hepatitis C Screening  Completed   Zoster (Shingles) Vaccine  Completed   HPV Vaccine  Aged Out

## 2024-03-22 NOTE — Progress Notes (Signed)
 Subjective:   Yvonne Long is a 73 y.o. female who presents for Medicare Annual (Subsequent) preventive examination.  Visit Complete: Virtual I connected with  [name] on 03/22/24 by a audio enabled telemedicine application and verified that I am speaking with the correct person using two identifiers.  Patient Location: Home  Provider Location: Office/Clinic  I discussed the limitations of evaluation and management by telemedicine. The patient expressed understanding and agreed to proceed.  Vital Signs: Because this visit was a virtual/telehealth visit, some criteria may be missing or patient reported. Any vitals not documented were not able to be obtained and vitals that have been documented are patient reported.  Patient Medicare AWV questionnaire was completed by the patient on n/a; I have confirmed that all information answered by patient is correct and no changes since this date.  Cardiac Risk Factors include: advanced age (>49men, >63 women);diabetes mellitus;smoking/ tobacco exposure;dyslipidemia;hypertension;obesity (BMI >30kg/m2);family history of premature cardiovascular disease     Objective:    Today's Vitals   03/22/24 0948  Weight: 210 lb (95.3 kg)  Height: 5\' 6"  (1.676 m)   Body mass index is 33.89 kg/m.     03/22/2024    9:58 AM 03/22/2023   10:08 AM 02/03/2023    1:46 PM 01/21/2023   11:22 AM 01/11/2023   11:32 AM 03/16/2022    9:57 AM 03/12/2021   10:00 AM  Advanced Directives  Does Patient Have a Medical Advance Directive? Yes Yes Yes No No Yes Yes  Type of Estate agent of [place];Living will Living will Healthcare Power of Attorney   Living will Healthcare Power of [place];Living will  Does patient want to make changes to medical advance directive? No - Patient declined No - Patient declined No - Patient declined   No - Patient declined No - Patient declined  Copy of Healthcare Power of Attorney in Chart? No - copy  requested  No - copy requested    No - copy requested  Would patient like information on creating a medical advance directive?    No - Patient declined No - Patient declined      Current Medications (verified) Outpatient Encounter Medications as of 03/22/2024  Medication Sig   acetaminophen (TYLENOL) 500 MG tablet Take 1,000 mg by mouth every 6 (six) hours as needed for moderate pain.   AMBULATORY NON FORMULARY MEDICATION Medication Name: CPAP with nasal pillows and humidifier.  Set to AutoPap 4-20 cm watere pressure setting for 10 days and then please fax his download so that we can set her pressure.  New diagnosis of obstructive sleep apnea with AHI of 14.2.  Was faxed to [facility]   amLODipine (NORVASC) 5 MG tablet TAKE 1 TABLET BY MOUTH DAILY   atorvastatin (LIPITOR) 20 MG tablet TAKE 1 TABLET BY MOUTH DAILY   b complex vitamins capsule Take 1 capsule by mouth daily.   BIOTIN PO Take 1 tablet by mouth daily.   Cholecalciferol (VITAMIN D3) 25 MCG (1000 UT) CAPS Take 1,000 Int'l Units by mouth.   Glucosamine HCl 1500 MG TABS Take 1 tablet by mouth daily.   Magnesium 400 MG TABS Take 400 mg by mouth daily.   Melatonin 10 MG TABS Take 10 mg by mouth at bedtime.   metFORMIN (GLUCOPHAGE) 500 MG tablet TAKE 1 TABLET BY MOUTH TWICE  DAILY WITH MEALS   PARoxetine (PAXIL) 30 MG tablet Take 1 tablet (30 mg total) by mouth daily. Needs a follow up appointment.   solifenacin (  VESICARE) 10 MG tablet Take 1 tablet (10 mg total) by mouth daily.   Tetrahydrozoline HCl (VISINE OP) Place 1 drop into both eyes daily.   pantoprazole (PROTONIX) 40 MG tablet TAKE 1 TABLET BY MOUTH DAILY (Patient not taking: Reported on 03/22/2024)   tirzepatide [facility]) 2.5 MG/0.5ML Pen INJECT 2.5 MG SUBCUTANEOUSLY WEEKLY (Patient not taking: Reported on 03/22/2024)   [DISCONTINUED] ondansetron (ZOFRAN) 4 MG tablet Take 1 tablet (4 mg total) by mouth every 8 (eight) hours as needed for nausea or vomiting.   No  facility-administered encounter medications on file as of 03/22/2024.    Allergies (verified) Patient has no known allergies.   History: Past Medical History:  Diagnosis Date   Allergy    Pollen, dust   Anxiety    Perform exercise to control   Cataract    Will probably have them removed soon.   Chronic kidney disease (CKD) stage G3b/A1, moderately decreased glomerular filtration rate (GFR) between 30-44 mL/min/1.73 square meter and albuminuria creatinine ratio less than 30 mg/g (HCC) 03/19/2016   COVID 04/10/2022   Depression    Diabetes mellitus without complication (HCC)    Encounter for weight management 08/12/2022   GERD (gastroesophageal reflux disease)    Herpes simplex infection 07/17/2019   Oral and genital   Hyperlipidemia 07/24/2008   Qualifier: Diagnosis of  By: Diannia Ruder RN, Paula     Hypertension    Take meds to control   Left knee pain 11/20/2015   Major depressive disorder, recurrent episode (HCC) 09/28/2006   Qualifier: Diagnosis of  By: Linford Arnold MD, Catherine     OSA (obstructive sleep apnea) 11/11/2018   Study 10/2018 IMPRESSIONS - Mild obstructive sleep apnea occurred during this study (AHI = 14.2/h). - No significant central sleep apnea occurred during this study (CAI = 0.0/h). - Oxygen desaturation was noted during this study (Min O2 = 85%). - Patient snored.   Palpitations 07/05/2007   Qualifier: Diagnosis of  By: Linford Arnold MD, Santina Evans     Primary localized osteoarthritis of left knee 11/02/2018   Sleep apnea    Use CPAP   Past Surgical History:  Procedure Laterality Date   ABDOMINAL HYSTERECTOMY  2005   Dr. Waylan Rocher   BILATERAL SALPINGOOPHORECTOMY  2005   Dr. Kevin Fenton   CHOLECYSTECTOMY  2013   CYSTOCELE REPAIR  2005   with bladder tack   JOINT REPLACEMENT  2021   Left knee   SPINE SURGERY  01/2010   Lumbar Laminectomy-Dr. Manson Passey   TOTAL KNEE ARTHROPLASTY Left 11/14/2018   Procedure: left TOTAL KNEE ARTHROPLASTY;  Surgeon: Salvatore Marvel, MD;   Location: [facility] OR;  Service: Orthopedics;  Laterality: Left;   TOTAL KNEE ARTHROPLASTY Right 02/03/2023   Procedure: TOTAL KNEE ARTHROPLASTY;  Surgeon: Joen Laura, MD;  Location: [facility] ORS;  Service: Orthopedics;  Laterality: Right;   Family History  Problem Relation Age of Onset   Hypertension Mother    Hearing loss Mother    Alcohol abuse Father    Depression Father    Peripheral vascular disease Father    Thrombosis Father        deceased from thrombosis of leg.   Anxiety disorder Father    Anxiety disorder Sister    Depression Sister    Obesity Sister    Depression Sister    Obesity Sister    Depression Son    Anxiety disorder Son    Drug abuse Son    Arthritis Maternal Grandmother    Cancer Maternal Grandmother  Heart disease Maternal Grandfather    Arthritis Paternal Grandmother    Heart disease Paternal Grandfather    Heart attack Other    Cancer Other        Brain cancer   Stroke Other    Social History   Socioeconomic History   Marital status: Married    Spouse name: Bruce   Number of children: 2   Years of education: 16   Highest education level: Master's degree (e.g., MA, MS, MEng, MEd, MSW, MBA)  Occupational History   Occupation: PROGRAM Event organiser: [employer]    Comment: Retired   Occupation: Solicitor    Comment: Part-time  Tobacco Use   Smoking status: Former    Current packs/day: 0.00    Average packs/day: 1 pack/day for 15.0 years (15.0 ttl pk-yrs)    Types: Cigarettes    Start date: 12/22/1979    Quit date: 12/21/1994    Years since quitting: 29.2   Smokeless tobacco: Never  Vaping Use   Vaping status: Never Used  Substance and Sexual Activity   Alcohol use: Yes    Alcohol/week: 7.0 standard drinks of alcohol    Types: 7 Glasses of wine per week    Comment: 1 alcoholic drink per day   Drug use: No   Sexual activity: Not Currently    Birth control/protection: Post-menopausal  Other Topics Concern   Not on file   Social History Narrative   Lives with her husband. Enjoys yard work and yoga.    Social Drivers of Corporate investment banker Strain: Low Risk  (03/22/2024)   Overall Financial Resource Strain (CARDIA)    Difficulty of Paying Living Expenses: Not hard at all  Food Insecurity: No Food Insecurity (03/22/2024)   Hunger Vital Sign    Worried About Running Out of Food in the Last Year: Never true    Ran Out of Food in the Last Year: Never true  Transportation Needs: No Transportation Needs (03/22/2024)   PRAPARE - Administrator, Civil Service (Medical): No    Lack of Transportation (Non-Medical): No  Physical Activity: Sufficiently Active (03/22/2024)   Exercise Vital Sign    Days of Exercise per Week: 6 days    Minutes of Exercise per Session: 30 min  Stress: No Stress Concern Present (03/22/2024)   [employer] of Occupational Health - Occupational Stress Questionnaire    Feeling of Stress : Not at all  Social Connections: Socially Integrated (03/22/2024)   Social Connection and Isolation Panel [NHANES]    Frequency of Communication with Friends and Family: More than three times a week    Frequency of Social Gatherings with Friends and Family: Three times a week    Attends Religious Services: More than 4 times per year    Active Member of Clubs or Organizations: Yes    Attends Engineer, structural: More than 4 times per year    Marital Status: Married    Tobacco Counseling Counseling given: Not Answered   Clinical Intake:  Pre-visit preparation completed: Yes  Pain : No/denies pain     BMI - recorded: 33.89 Nutritional Status: BMI > 30  Obese Nutritional Risks: None Diabetes: Yes CBG done?: No Did pt. bring in CBG monitor from home?: No  How often do you need to have someone help you when you read instructions, pamphlets, or other written materials from your doctor or pharmacy?: 1 - Never What is the last grade level  you completed in school?:  16  Interpreter Needed?: No      Activities of Daily Living    03/22/2024    9:51 AM  In your present state of health, do you have any difficulty performing the following activities:  Hearing? 0  Vision? 0  Difficulty concentrating or making decisions? 0  Walking or climbing stairs? 0  Dressing or bathing? 0  Doing errands, shopping? 0  Preparing Food and eating ? N  Using the Toilet? N  In the past six months, have you accidently leaked urine? N  Do you have problems with loss of bowel control? N  Managing your Medications? N  Managing your Finances? N  Housekeeping or managing your Housekeeping? N    Patient Care Team: Agapito Games, MD as PCP - General (Family Medicine) Georgeanna Lea, MD as PCP - Cardiology (Cardiology) Candyce Churn, OD as Referring Physician (Optometry)  Indicate any recent Medical Services you may have received from other than [facility] providers in the past year (date may be approximate).     Assessment:   This is a routine wellness examination for Yvonne Long.  Hearing/Vision screen No results found.   Goals Addressed             This Visit's Progress    Activity and Exercise Increased       She would like to increase exercise.       Depression Screen    03/22/2024    9:57 AM 10/18/2023   10:34 AM 07/16/2023    2:54 PM 03/22/2023   10:08 AM 03/16/2023    5:09 PM 01/11/2023   11:32 AM 12/18/2022    3:34 PM  PHQ 2/9 Scores  PHQ - 2 Score 0 0 0 0 0 0 2  PHQ- 9 Score  3 0 6 6  8     Fall Risk    03/22/2024    9:59 AM 07/16/2023    2:35 PM 03/22/2023   10:08 AM 03/16/2023    4:17 PM 01/11/2023   11:32 AM  Fall Risk   Falls in the past year? 1 1 1 1  0  Number falls in past yr: 1 0 1 1   Injury with Fall? 1 1 1 1    Risk for fall due to : Impaired balance/gait History of fall(s) History of fall(s) History of fall(s)   Follow up Falls evaluation completed Falls evaluation completed Falls evaluation completed;Education  provided;Falls prevention discussed Falls evaluation completed     MEDICARE RISK AT HOME: Medicare Risk at Home Any stairs in or around the home?: Yes If so, are there any without handrails?: Yes Home free of loose throw rugs in walkways, pet beds, electrical cords, etc?: Yes Life alert?: No Use of a cane, walker or w/c?: No Grab bars in the bathroom?: Yes Shower chair or bench in shower?: Yes Elevated toilet seat or a handicapped toilet?: Yes  TIMED UP AND GO:  Was the test performed?  No    Cognitive Function:        03/22/2024   10:01 AM 03/22/2023   10:17 AM 03/16/2022   10:01 AM 03/12/2021   10:10 AM  6CIT Screen  What Year? 0 points 0 points 0 points 0 points  What month? 0 points 0 points 0 points 0 points  What time? 0 points 0 points 0 points 0 points  Count back from 20 0 points 0 points 0 points 0 points  Months in reverse 0 points 0 points  0 points 0 points  Repeat phrase 0 points 0 points 0 points 0 points  Total Score 0 points 0 points 0 points 0 points    Immunizations Immunization History  Administered Date(s) Administered   Fluad Quad(high Dose 65+) 10/03/2019, 10/02/2020, 09/29/2021   Fluad Trivalent(High Dose 65+) 09/22/2023   Influenza Split 10/15/2014   Influenza Whole 10/04/2007, 09/11/2008, 11/25/2009, 09/22/2010   Influenza, High Dose Seasonal PF 10/13/2017, 09/15/2018   Influenza-Unspecified 09/20/2013, 09/24/2013, 09/11/2022   PFIZER Comirnaty(Gray Top)Covid-19 Tri-Sucrose Vaccine 09/11/2022   PFIZER(Purple Top)SARS-COV-2 Vaccination 03/02/2020, 03/26/2020, 12/01/2020   Pfizer(Comirnaty)Fall Seasonal Vaccine 12 years and older 08/27/2023   Pneumococcal Conjugate-13 04/02/2021   Pneumococcal Polysaccharide-23 01/26/2019   Tdap 12/04/2007, 01/26/2019   Zoster Recombinant(Shingrix) 08/16/2021, 10/28/2021   Zoster, Live 07/17/2015    TDAP status: Up to date  Flu Vaccine status: Up to date  Pneumococcal vaccine status: Up to date  Covid-19  vaccine status: Information provided on how to obtain vaccines.   Qualifies for Shingles Vaccine? Yes   Zostavax completed Yes   Shingrix Completed?: Yes  Screening Tests Health Maintenance  Topic Date Due   MAMMOGRAM  09/04/2023   COVID-19 Vaccine (6 - 2024-25 season) 02/24/2024   FOOT EXAM  03/15/2024   HEMOGLOBIN A1C  04/17/2024   OPHTHALMOLOGY EXAM  06/01/2024   Diabetic kidney evaluation - eGFR measurement  07/15/2024   Diabetic kidney evaluation - Urine ACR  07/15/2024   INFLUENZA VACCINE  07/21/2024   Medicare Annual Wellness (AWV)  03/22/2025   Fecal DNA (Cologuard)  06/29/2025   DTaP/Tdap/Td (3 - Td or Tdap) 01/26/2029   Pneumonia Vaccine 44+ Years old  Completed   DEXA SCAN  Completed   Hepatitis C Screening  Completed   Zoster Vaccines- Shingrix  Completed   HPV VACCINES  Aged Out    Health Maintenance  Health Maintenance Due  Topic Date Due   MAMMOGRAM  09/04/2023   COVID-19 Vaccine (6 - 2024-25 season) 02/24/2024   FOOT EXAM  03/15/2024    Colorectal cancer screening: Type of screening: Cologuard. Completed 06/29/2022. Repeat every 3 years  Mammogram status: Ordered 03/22/2024. Pt provided with contact info and advised to call to schedule appt.   Bone Density status: Completed 04/02/2021. Results reflect: Bone density results: NORMAL. Repeat every 2 years.  Lung Cancer Screening: (Low Dose CT Chest recommended if Age 84-80 years, 20 pack-year currently smoking OR have quit w/in 15years.) does not qualify.   Lung Cancer Screening Referral: n/a  Additional Screening:  Hepatitis C Screening: does qualify; Completed 03/19/2016  Vision Screening: Recommended annual ophthalmology exams for early detection of glaucoma and other disorders of the eye. Is the patient up to date with their annual eye exam?  Yes  Who is the provider or what is the name of the office in which the patient attends annual eye exams? [facility] If pt is not established with  a provider, would they like to be referred to a provider to establish care?  N/a .   Dental Screening: Recommended annual dental exams for proper oral hygiene  Diabetic Foot Exam: Diabetic Foot Exam: Overdue, Pt has been advised about the importance in completing this exam. Pt is scheduled for diabetic foot exam on 03/27/2024.  Community Resource Referral / Chronic Care Management: CRR required this visit?  No   CCM required this visit?  No     Plan:     I have personally reviewed and noted the following in the patient's chart:  Medical and social history Use of alcohol, tobacco or illicit drugs  Current medications and supplements including opioid prescriptions. Patient is not currently taking opioid prescriptions. Functional ability and status Nutritional status Physical activity Advanced directives List of other physicians Hospitalizations, surgeries, and ER visits in previous 12 months. No Vitals Screenings to include cognitive, depression, and falls Referrals and appointments  In addition, I have reviewed and discussed with patient certain preventive protocols, quality metrics, and best practice recommendations. A written personalized care plan for preventive services as well as general preventive health recommendations were provided to patient.     Nga, Rabon, CMA   03/22/2024   After Visit Summary: (MyChart) Due to this being a telephonic visit, the after visit summary with patients personalized plan was offered to patient via MyChart   Nurse Notes:   Anelis Hrivnak is a 73 y.o. female patient of Metheney, Barbarann Ehlers, MD who had a Medicare Annual Wellness Visit today via telephone. Jerni is Working part time and lives with their spouse. She has 2 children. She reports that she is socially active and does interact with friends/family regularly. She is minimally physically active and enjoys yard work and yoga.

## 2024-03-27 ENCOUNTER — Ambulatory Visit: Payer: Medicare Other | Admitting: Family Medicine

## 2024-03-28 ENCOUNTER — Ambulatory Visit (INDEPENDENT_AMBULATORY_CARE_PROVIDER_SITE_OTHER): Admitting: Family Medicine

## 2024-03-28 ENCOUNTER — Encounter: Payer: Self-pay | Admitting: Family Medicine

## 2024-03-28 VITALS — BP 129/67 | HR 68 | Ht 66.0 in | Wt 214.0 lb

## 2024-03-28 DIAGNOSIS — E669 Obesity, unspecified: Secondary | ICD-10-CM | POA: Diagnosis not present

## 2024-03-28 DIAGNOSIS — Z1231 Encounter for screening mammogram for malignant neoplasm of breast: Secondary | ICD-10-CM

## 2024-03-28 DIAGNOSIS — N1832 Chronic kidney disease, stage 3b: Secondary | ICD-10-CM | POA: Diagnosis not present

## 2024-03-28 DIAGNOSIS — E1169 Type 2 diabetes mellitus with other specified complication: Secondary | ICD-10-CM

## 2024-03-28 DIAGNOSIS — D692 Other nonthrombocytopenic purpura: Secondary | ICD-10-CM | POA: Diagnosis not present

## 2024-03-28 DIAGNOSIS — I10 Essential (primary) hypertension: Secondary | ICD-10-CM | POA: Diagnosis not present

## 2024-03-28 DIAGNOSIS — Z7984 Long term (current) use of oral hypoglycemic drugs: Secondary | ICD-10-CM

## 2024-03-28 LAB — POCT GLYCOSYLATED HEMOGLOBIN (HGB A1C): Hemoglobin A1C: 6.1 % — AB (ref 4.0–5.6)

## 2024-03-28 NOTE — Assessment & Plan Note (Signed)
 Due to recheck renal function.  Will go ahead and get urine microalbumin today as well to check for proteinuria.

## 2024-03-28 NOTE — Assessment & Plan Note (Signed)
 Bruising on forearms but benign.

## 2024-03-28 NOTE — Progress Notes (Signed)
 Established Patient Office Visit  Subjective  Patient ID: Yvonne Long, female    DOB: 1951/03/13  Age: 73 y.o. MRN: [id number]  Chief Complaint  Patient presents with   Diabetes   Hypertension    HPI  Hypertension- Pt denies chest pain, SOB, dizziness, or heart palpitations.  Taking meds as directed w/o problems.  Denies medication side effects.    She is doing well overall.  She did not end up trying the GLP-1 again but it really caused severe nausea it was the second time she tried it so she stopped it.  She is just taking her metformin she has been walking a mile with her neighbor a few times a week and now has a Fitbit she has been doing that pretty consistently for about a month and she has noticed some improvement in her lower extremity strength.  Otherwise no other major changes.    ROS    Objective:     BP 129/67   Pulse 68   Ht 5\' 6"  (1.676 m)   Wt 214 lb (97.1 kg)   SpO2 95%   BMI 34.54 kg/m    Physical Exam Vitals and nursing note reviewed.  Constitutional:      Appearance: Normal appearance.  HENT:     Head: Normocephalic and atraumatic.  Eyes:     Conjunctiva/sclera: Conjunctivae normal.  Cardiovascular:     Rate and Rhythm: Normal rate and regular rhythm.  Pulmonary:     Effort: Pulmonary effort is normal.     Breath sounds: Normal breath sounds.  Skin:    General: Skin is warm and dry.  Neurological:     Mental Status: She is alert.  Psychiatric:        Mood and Affect: Mood normal.      Results for orders placed or performed in visit on 03/28/24  POCT HgB A1C  Result Value Ref Range   Hemoglobin A1C 6.1 (A) 4.0 - 5.6 %   HbA1c POC (<> result, manual entry)     HbA1c, POC (prediabetic range)     HbA1c, POC (controlled diabetic range)        The 10-year ASCVD risk score (Arnett DK, et al., 2019) is: 23.6%    Assessment & Plan:   Problem List Items Addressed This Visit       Cardiovascular and Mediastinum    Senile purpura (HCC)   Bruising on forearms but benign.      Essential hypertension - Primary   Well controlled. Continue current regimen. Follow up in  6o       Relevant Orders   CMP14+EGFR   Urine Microalbumin w/creat. ratio     Endocrine   Type 2 diabetes mellitus with obesity (HCC)   A1C of 6.1,  well controlled on metformin.  Continue current regimen.  Follow-up in 4 months call if any problems or concerns will get updated labs today.        Relevant Orders   CMP14+EGFR   Urine Microalbumin w/creat. ratio   POCT HgB A1C (Completed)     Genitourinary   Chronic kidney disease (CKD) stage G3b/A1, moderately decreased glomerular filtration rate (GFR) between 30-44 mL/min/1.73 square meter and albuminuria creatinine ratio less than 30 mg/g (HCC)   Due to recheck renal function.  Will go ahead and get urine microalbumin today as well to check for proteinuria.      Other Visit Diagnoses       Encounter for screening mammogram for malignant neoplasm  of breast       Relevant Orders   MM 3D SCREENING MAMMOGRAM BILATERAL BREAST       Return in about 4 months (around 07/28/2024) for Diabetes follow-up.    Nani Gasser, MD

## 2024-03-28 NOTE — Assessment & Plan Note (Signed)
Well controlled. Continue current regimen. Follow up in  6 o

## 2024-03-28 NOTE — Assessment & Plan Note (Addendum)
 A1C of 6.1,  well controlled on metformin.  Continue current regimen.  Follow-up in 4 months call if any problems or concerns will get updated labs today.

## 2024-03-29 ENCOUNTER — Encounter: Payer: Self-pay | Admitting: Family Medicine

## 2024-03-29 LAB — CMP14+EGFR
ALT: 19 IU/L (ref 0–32)
AST: 14 IU/L (ref 0–40)
Albumin: 4.3 g/dL (ref 3.8–4.8)
Alkaline Phosphatase: 85 IU/L (ref 44–121)
BUN/Creatinine Ratio: 13 (ref 12–28)
BUN: 13 mg/dL (ref 8–27)
Bilirubin Total: 0.3 mg/dL (ref 0.0–1.2)
CO2: 20 mmol/L (ref 20–29)
Calcium: 9.3 mg/dL (ref 8.7–10.3)
Chloride: 102 mmol/L (ref 96–106)
Creatinine, Ser: 0.99 mg/dL (ref 0.57–1.00)
Globulin, Total: 2.3 g/dL (ref 1.5–4.5)
Glucose: 142 mg/dL — ABNORMAL HIGH (ref 70–99)
Potassium: 4.2 mmol/L (ref 3.5–5.2)
Sodium: 142 mmol/L (ref 134–144)
Total Protein: 6.6 g/dL (ref 6.0–8.5)
eGFR: 61 mL/min/{1.73_m2} (ref >59)

## 2024-03-29 NOTE — Progress Notes (Signed)
 Your lab work is within acceptable range and there are no concerning findings.   ?

## 2024-04-03 ENCOUNTER — Telehealth: Admitting: Nurse Practitioner

## 2024-04-03 ENCOUNTER — Ambulatory Visit: Payer: Self-pay

## 2024-04-03 DIAGNOSIS — M9907 Segmental and somatic dysfunction of upper extremity: Secondary | ICD-10-CM | POA: Diagnosis not present

## 2024-04-03 DIAGNOSIS — M9908 Segmental and somatic dysfunction of rib cage: Secondary | ICD-10-CM | POA: Diagnosis not present

## 2024-04-03 DIAGNOSIS — M62838 Other muscle spasm: Secondary | ICD-10-CM | POA: Diagnosis not present

## 2024-04-03 DIAGNOSIS — M9901 Segmental and somatic dysfunction of cervical region: Secondary | ICD-10-CM | POA: Diagnosis not present

## 2024-04-03 DIAGNOSIS — M5412 Radiculopathy, cervical region: Secondary | ICD-10-CM | POA: Diagnosis not present

## 2024-04-03 DIAGNOSIS — M9902 Segmental and somatic dysfunction of thoracic region: Secondary | ICD-10-CM | POA: Diagnosis not present

## 2024-04-03 DIAGNOSIS — M546 Pain in thoracic spine: Secondary | ICD-10-CM | POA: Diagnosis not present

## 2024-04-03 MED ORDER — CYCLOBENZAPRINE HCL 5 MG PO TABS
5.0000 mg | ORAL_TABLET | Freq: Three times a day (TID) | ORAL | 1 refills | Status: DC | PRN
Start: 2024-04-03 — End: 2024-06-22

## 2024-04-03 NOTE — Telephone Encounter (Signed)
   Chief Complaint: back pain Symptoms: right sided arm weakness/numbness, upper back pain Frequency: 2 days  Pertinent Negatives: Patient denies sob, chest pain, fever,  Disposition: [] ED /[] Urgent Care (no appt availability in office) / [] Appointment(In office/virtual)/ [x]  [facility]/ [] Home Care/ [] Refused Recommended Disposition /[] [facility]/ []  Follow-up with PCP Additional Notes: Patient reports she lifted a laundry basket 2 days ago and is now experiencing severe upper back pain. Patient reports it is causing radiating pain and weakness in her right arm, as well. Patient denies chest pain, sob, and loss of bladder control. Patient said she's been taking tylenol with no relief. Attempted to scheduled in office appt, no availability today. Patient requested virtual visit today, so virtual scheduled 04/04/24. Patient advised to call back with worsening symptoms. Patient verbalized understanding.   Copied from CRM [id number]. Topic: Clinical - Red Word Triage >> Apr 03, 2024  4:17 PM Yvonne Long wrote: Red Word that prompted transfer to Nurse Triage: Patient pulled a muscle in her back two days and is in terrible terrible pain. Reason for Disposition  [1] SEVERE back pain (e.g., excruciating, unable to do any normal activities) AND [2] not improved 2 hours after pain medicine  Answer Assessment - Initial Assessment Questions 1. ONSET: "When did the pain begin?"      2 days ago 2. LOCATION: "Where does it hurt?" (upper, mid or lower back)     Upper back  pain 3. SEVERITY: "How bad is the pain?"  (e.g., Scale 1-10; mild, moderate, or severe)   - MILD (1-3): Doesn't interfere with normal activities.    - MODERATE (4-7): Interferes with normal activities or awakens from sleep.    - SEVERE (8-10): Excruciating pain, unable to do any normal activities.      severe 4. PATTERN: "Is the pain constant?" (e.g., yes, no; constant, intermittent)      constant 5. RADIATION: "Does  the pain shoot into your legs or somewhere else?"     none 6. CAUSE:  "What do you think is causing the back pain?"      Picked up a basket and that started it 7. BACK OVERUSE:  "Any recent lifting of heavy objects, strenuous work or exercise?"     Lifting heavy objects 8. MEDICINES: "What have you taken so far for the pain?" (e.g., nothing, acetaminophen, NSAIDS)     tylenol 9. NEUROLOGIC SYMPTOMS: "Do you have any weakness, numbness, or problems with bowel/bladder control?"     denies 10. OTHER SYMPTOMS: "Do you have any other symptoms?" (e.g., fever, abdomen pain, burning with urination, blood in urine)       denies  Protocols used: Back Pain-A-AH

## 2024-04-03 NOTE — Progress Notes (Signed)
 Virtual Visit Consent   Yvonne Long, you are scheduled for a virtual visit with a  provider today. Just as with appointments in the office, your consent must be obtained to participate. Your consent will be active for this visit and any virtual visit you may have with one of our providers in the next 365 days. If you have a MyChart account, a copy of this consent can be sent to you electronically.  As this is a virtual visit, video technology does not allow for your provider to perform a traditional examination. This may limit your provider's ability to fully assess your condition. If your provider identifies any concerns that need to be evaluated in person or the need to arrange testing (such as labs, EKG, etc.), we will make arrangements to do so. Although advances in technology are sophisticated, we cannot ensure that it will always work on either your end or our end. If the connection with a video visit is poor, the visit may have to be switched to a telephone visit. With either a video or telephone visit, we are not always able to ensure that we have a secure connection.  By engaging in this virtual visit, you consent to the provision of healthcare and authorize for your insurance to be billed (if applicable) for the services provided during this visit. Depending on your insurance coverage, you may receive a charge related to this service.  I need to obtain your verbal consent now. Are you willing to proceed with your visit today? Yvonne Long has provided verbal consent on 04/03/2024 for a virtual visit (video or telephone). Yvonne Simas, FNP  Date: 04/03/2024 5:07 PM   Virtual Visit via Video Note   I, Yvonne Long, connected with  Yvonne Long  ([id number], 10-31-1951) on 04/03/24 at  5:15 PM EDT by a video-enabled telemedicine application and verified that I am speaking with the correct person using two identifiers.  Location: Patient: Virtual Visit  Location Patient: Home Provider: Virtual Visit Location Provider: Home Office   I discussed the limitations of evaluation and management by telemedicine and the availability of in person appointments. The patient expressed understanding and agreed to proceed.    History of Present Illness: Yvonne Long is a 73 y.o. who identifies as a female who was assigned female at birth, and is being seen today for back pain.  Symptom onset was 2 days ago   She picked up a basket of clothes and felt as though she pulled a muscle in her back  She has been alternating heat and ice and using tylenol for pain relief   Pain is in upper back near shoulder blades   She has had a hard time sleeping due to pain  Has injured the area in the past No history of surgeries    Problems:  Patient Active Problem List   Diagnosis Date Noted   Essential hypertension 07/16/2023   Right upper quadrant pain 02/26/2023   Localized osteoarthritis of right knee 02/03/2023   Encounter for weight management 08/12/2022   Generalized abdominal pain 07/14/2022   Senile purpura (HCC) 06/18/2022   Benign essential microscopic hematuria 06/03/2021   Mid back pain on right side 06/02/2021   Herpes simplex infection 07/17/2019   OSA (obstructive sleep apnea) 11/11/2018   Primary localized osteoarthritis of left knee 11/02/2018   Chronic kidney disease (CKD) stage G3b/A1, moderately decreased glomerular filtration rate (GFR) between 30-44 mL/min/1.73 square meter and albuminuria creatinine ratio less than 30 mg/g (HCC) 03/19/2016  Thoracic back pain 02/12/2016   Type 2 diabetes mellitus with obesity (HCC) 04/16/2015   Hyperlipidemia 07/24/2008   PALPITATIONS 07/05/2007   Major depressive disorder, recurrent episode (HCC) 09/28/2006    Allergies: No Known Allergies Medications:  Current Outpatient Medications:    acetaminophen (TYLENOL) 500 MG tablet, Take 1,000 mg by mouth every 6 (six) hours as needed for  moderate pain., Disp: , Rfl:    AMBULATORY NON FORMULARY MEDICATION, Medication Name: CPAP with nasal pillows and humidifier.  Set to AutoPap 4-20 cm watere pressure setting for 10 days and then please fax his download so that we can set her pressure.  New diagnosis of obstructive sleep apnea with AHI of 14.2.  Was faxed to [facility], Disp: 1 Units, Rfl: 0   amLODipine (NORVASC) 5 MG tablet, TAKE 1 TABLET BY MOUTH DAILY, Disp: 100 tablet, Rfl: 2   atorvastatin (LIPITOR) 20 MG tablet, TAKE 1 TABLET BY MOUTH DAILY, Disp: 90 tablet, Rfl: 3   b complex vitamins capsule, Take 1 capsule by mouth daily., Disp: , Rfl:    BIOTIN PO, Take 1 tablet by mouth daily., Disp: , Rfl:    Cholecalciferol (VITAMIN D3) 25 MCG (1000 UT) CAPS, Take 1,000 Int'l Units by mouth., Disp: , Rfl:    finasteride (PROSCAR) 5 MG tablet, Take 5 mg by mouth daily., Disp: , Rfl:    Glucosamine HCl 1500 MG TABS, Take 1 tablet by mouth daily., Disp: , Rfl:    Magnesium 400 MG TABS, Take 400 mg by mouth daily., Disp: , Rfl:    Melatonin 10 MG TABS, Take 10 mg by mouth at bedtime., Disp: , Rfl:    metFORMIN (GLUCOPHAGE) 500 MG tablet, TAKE 1 TABLET BY MOUTH TWICE  DAILY WITH MEALS, Disp: 200 tablet, Rfl: 2   Omega-3 Fatty Acids (FISH OIL) 1000 MG CAPS, Take by mouth., Disp: , Rfl:    PARoxetine (PAXIL) 30 MG tablet, Take 1 tablet (30 mg total) by mouth daily. Needs a follow up appointment., Disp: 90 tablet, Rfl: 3   solifenacin (VESICARE) 10 MG tablet, Take 1 tablet (10 mg total) by mouth daily., Disp: 90 tablet, Rfl: 3   Tetrahydrozoline HCl (VISINE OP), Place 1 drop into both eyes daily., Disp: , Rfl:   Observations/Objective: Patient is well-developed, well-nourished in no acute distress.  Resting comfortably  at home.  Head is normocephalic, atraumatic.  No labored breathing.  Speech is clear and coherent with logical content.  Patient is alert and oriented at baseline.    Assessment and Plan:  1. Muscle spasm  (Primary)  - cyclobenzaprine (FLEXERIL) 5 MG tablet; Take 1 tablet (5 mg total) by mouth 3 (three) times daily as needed for muscle spasms.  Dispense: 30 tablet; Refill: 1     Follow Up Instructions: I discussed the assessment and treatment plan with the patient. The patient was provided an opportunity to ask questions and all were answered. The patient agreed with the plan and demonstrated an understanding of the instructions.  A copy of instructions were sent to the patient via MyChart unless otherwise noted below.    The patient was advised to call back or seek an in-person evaluation if the symptoms worsen or if the condition fails to improve as anticipated.    Yvonne Simas, FNP

## 2024-04-04 ENCOUNTER — Encounter: Payer: Self-pay | Admitting: Family Medicine

## 2024-04-04 NOTE — Progress Notes (Signed)
 [facility] Quality Team Note  Name: Yvonne Long Date of Birth: 06/11/1951 MRN: [id number] Date: 04/04/2024  [facility] Quality Team has reviewed this patient's chart, please see recommendations below:  [facility] Quality Other; (Patient has order for Microalbumin Creatinine urine test. Please address at upcoming office visit)

## 2024-04-05 MED ORDER — TRAMADOL HCL 50 MG PO TABS: 50.0000 mg | ORAL_TABLET | Freq: Three times a day (TID) | ORAL | 0 refills | Status: AC | PRN

## 2024-04-05 NOTE — Addendum Note (Signed)
 Addended by: Alizae Bechtel D on: 04/05/2024 08:19 AM   Modules accepted: Orders

## 2024-04-05 NOTE — Telephone Encounter (Signed)
 Fyi.  Patient was seen on 04/03/24 for the following sxs.

## 2024-04-12 ENCOUNTER — Ambulatory Visit: Attending: Cardiology | Admitting: Cardiology

## 2024-04-12 ENCOUNTER — Encounter: Payer: Self-pay | Admitting: Cardiology

## 2024-04-12 VITALS — BP 130/80 | HR 58 | Ht 66.0 in | Wt 214.6 lb

## 2024-04-12 DIAGNOSIS — I10 Essential (primary) hypertension: Secondary | ICD-10-CM | POA: Diagnosis not present

## 2024-04-12 DIAGNOSIS — E782 Mixed hyperlipidemia: Secondary | ICD-10-CM | POA: Diagnosis not present

## 2024-04-12 DIAGNOSIS — G4733 Obstructive sleep apnea (adult) (pediatric): Secondary | ICD-10-CM

## 2024-04-12 DIAGNOSIS — R002 Palpitations: Secondary | ICD-10-CM

## 2024-04-12 NOTE — Patient Instructions (Signed)
 Medication Instructions:  Your physician recommends that you continue on your current medications as directed. Please refer to the Current Medication list given to you today.  *If you need a refill on your cardiac medications before your next appointment, please call your pharmacy*  Lab Work: None If you have labs (blood work) drawn today and your tests are completely normal, you will receive your results only by: MyChart Message (if you have MyChart) OR A paper copy in the mail If you have any lab test that is abnormal or we need to change your treatment, we will call you to review the results.  Testing/Procedures: None  Follow-Up: At [facility], you and your health needs are our priority.  As part of our continuing mission to provide you with exceptional heart care, our providers are all part of one team.  This team includes your primary Cardiologist (physician) and Advanced Practice Providers or APPs (Physician Assistants and Nurse Practitioners) who all work together to provide you with the care you need, when you need it.  Your next appointment:   1 year(s)  Provider:   Ralene Burger, MD    We recommend signing up for the patient portal called "MyChart".  Sign up information is provided on this After Visit Summary.  MyChart is used to connect with patients for Virtual Visits (Telemedicine).  Patients are able to view lab/test results, encounter notes, upcoming appointments, etc.  Non-urgent messages can be sent to your provider as well.   To learn more about what you can do with MyChart, go to [email or web address].   Other Instructions None

## 2024-04-12 NOTE — Progress Notes (Signed)
 Cardiology Office Note:    Date:  04/12/2024   ID:  Yvonne Long, DOB 09/28/1951, MRN [id number]  PCP:  Cydney Draft, MD  Cardiologist:  Ralene Burger, MD    Referring MD: Cydney Draft, *   Chief Complaint  Patient presents with   Follow-up    History of Present Illness:    Yvonne Long is a 73 y.o. female past medical history significant for essential hypertension, obstructive sleep apnea, palpitation she was found to have short episode of supraventricular tachycardia we tried verapamil  she was unable to tolerate it now she is without medication doing quite well.  Stress test done last year was negative.  Comes today to months for follow-up overall doing well with rare episode of palpitation does not bother her much she has had to do when she is  Past Medical History:  Diagnosis Date   Allergy    Pollen, dust   Anxiety    Perform exercise to control   Cataract    Will probably have them removed soon.   Chronic kidney disease (CKD) stage G3b/A1, moderately decreased glomerular filtration rate (GFR) between 30-44 mL/min/1.73 square meter and albuminuria creatinine ratio less than 30 mg/g (HCC) 03/19/2016   COVID 04/10/2022   Depression    Diabetes mellitus without complication (HCC)    Encounter for weight management 08/12/2022   GERD (gastroesophageal reflux disease)    Herpes simplex infection 07/17/2019   Oral and genital   Hyperlipidemia 07/24/2008   Qualifier: Diagnosis of  By: Clifford Dam RN, Paula     Hypertension    Take meds to control   Left knee pain 11/20/2015   Major depressive disorder, recurrent episode (HCC) 09/28/2006   Qualifier: Diagnosis of  By: Greer Leak MD, Catherine     OSA (obstructive sleep apnea) 11/11/2018   Study 10/2018 IMPRESSIONS - Mild obstructive sleep apnea occurred during this study (AHI = 14.2/h). - No significant central sleep apnea occurred during this study (CAI = 0.0/h). - Oxygen desaturation was  noted during this study (Min O2 = 85%). - Patient snored.   Palpitations 07/05/2007   Qualifier: Diagnosis of  By: Greer Leak MD, Bynum Cassis     Primary localized osteoarthritis of left knee 11/02/2018   Sleep apnea    Use CPAP    Past Surgical History:  Procedure Laterality Date   ABDOMINAL HYSTERECTOMY  2005   Dr. Luster Salters   BILATERAL SALPINGOOPHORECTOMY  2005   Dr. Earnie Gola   CHOLECYSTECTOMY  2013   CYSTOCELE REPAIR  2005   with bladder tack   JOINT REPLACEMENT  2021   Left knee   SPINE SURGERY  01/2010   Lumbar Laminectomy-Dr. Bevin Bucks   TOTAL KNEE ARTHROPLASTY Left 11/14/2018   Procedure: left TOTAL KNEE ARTHROPLASTY;  Surgeon: Elly Habermann, MD;  Location: [facility] OR;  Service: Orthopedics;  Laterality: Left;   TOTAL KNEE ARTHROPLASTY Right 02/03/2023   Procedure: TOTAL KNEE ARTHROPLASTY;  Surgeon: Murleen Arms, MD;  Location: [facility] ORS;  Service: Orthopedics;  Laterality: Right;    Current Medications: Current Meds  Medication Sig   acetaminophen  (TYLENOL ) 500 MG tablet Take 1,000 mg by mouth every 6 (six) hours as needed for moderate pain.   AMBULATORY NON FORMULARY MEDICATION Medication Name: CPAP with nasal pillows and humidifier.  Set to AutoPap 4-20 cm watere pressure setting for 10 days and then please fax his download so that we can set her pressure.  New diagnosis of obstructive sleep apnea with AHI of 14.2.  Was faxed to  [facility] (Patient taking differently: 1 each by Other route See admin instructions. Medication Name: CPAP with nasal pillows and humidifier.  Set to AutoPap 4-20 cm watere pressure setting for 10 days and then please fax his download so that we can set her pressure.  New diagnosis of obstructive sleep apnea with AHI of 14.2.  Was faxed to [facility])   amLODipine  (NORVASC ) 5 MG tablet TAKE 1 TABLET BY MOUTH DAILY   atorvastatin  (LIPITOR) 20 MG tablet TAKE 1 TABLET BY MOUTH DAILY   b complex vitamins capsule Take 1 capsule by mouth daily.    BIOTIN PO Take 1 tablet by mouth daily.   Cholecalciferol  (VITAMIN D3) 25 MCG (1000 UT) CAPS Take 1,000 Int'l Units by mouth daily.   cyclobenzaprine  (FLEXERIL ) 5 MG tablet Take 1 tablet (5 mg total) by mouth 3 (three) times daily as needed for muscle spasms.   finasteride (PROSCAR) 5 MG tablet Take 5 mg by mouth daily.   Glucosamine HCl 1500 MG TABS Take 1 tablet by mouth daily.   Magnesium  400 MG TABS Take 400 mg by mouth daily.   Melatonin 10 MG TABS Take 10 mg by mouth at bedtime.   metFORMIN  (GLUCOPHAGE ) 500 MG tablet TAKE 1 TABLET BY MOUTH TWICE  DAILY WITH MEALS   minoxidil (LONITEN) 2.5 MG tablet Take 1.25 mg by mouth daily.   Omega-3 Fatty Acids (FISH OIL) 1000 MG CAPS Take 1 capsule by mouth daily.   PARoxetine  (PAXIL ) 30 MG tablet Take 1 tablet (30 mg total) by mouth daily. Needs a follow up appointment.   solifenacin  (VESICARE ) 10 MG tablet Take 1 tablet (10 mg total) by mouth daily.   Tetrahydrozoline HCl (VISINE OP) Place 1 drop into both eyes daily.     Allergies:   Patient has no known allergies.   Social History   Socioeconomic History   Marital status: Married    Spouse name: Bruce   Number of children: 2   Years of education: 16   Highest education level: Master's degree (e.g., MA, MS, MEng, MEd, MSW, MBA)  Occupational History   Occupation: PROGRAM Event organiser: [employer]    Comment: Retired   Occupation: Solicitor    Comment: Part-time  Tobacco Use   Smoking status: Former    Current packs/day: 0.00    Average packs/day: 1 pack/day for 15.0 years (15.0 ttl pk-yrs)    Types: Cigarettes    Start date: 12/22/1979    Quit date: 12/21/1994    Years since quitting: 29.3   Smokeless tobacco: Never  Vaping Use   Vaping status: Never Used  Substance and Sexual Activity   Alcohol  use: Yes    Alcohol /week: 7.0 standard drinks of alcohol     Types: 7 Glasses of wine per week    Comment: 1 alcoholic drink per day   Drug use: No   Sexual activity:  Not Currently    Birth control/protection: Post-menopausal  Other Topics Concern   Not on file  Social History Narrative   Lives with her husband. Enjoys yard work and yoga.    Social Drivers of Corporate investment banker Strain: Low Risk  (03/22/2024)   Overall Financial Resource Strain (CARDIA)    Difficulty of Paying Living Expenses: Not hard at all  Food Insecurity: No Food Insecurity (03/22/2024)   Hunger Vital Sign    Worried About Running Out of Food in the Last Year: Never true    Ran Out of  Food in the Last Year: Never true  Transportation Needs: No Transportation Needs (03/22/2024)   PRAPARE - Administrator, Civil Service (Medical): No    Lack of Transportation (Non-Medical): No  Physical Activity: Sufficiently Active (03/22/2024)   Exercise Vital Sign    Days of Exercise per Week: 6 days    Minutes of Exercise per Session: 30 min  Stress: No Stress Concern Present (03/22/2024)   [employer] of Occupational Health - Occupational Stress Questionnaire    Feeling of Stress : Not at all  Social Connections: Socially Integrated (03/22/2024)   Social Connection and Isolation Panel [NHANES]    Frequency of Communication with Friends and Family: More than three times a week    Frequency of Social Gatherings with Friends and Family: Three times a week    Attends Religious Services: More than 4 times per year    Active Member of Clubs or Organizations: Yes    Attends Engineer, structural: More than 4 times per year    Marital Status: Married     Family History: The patient's family history includes Alcohol  abuse in her father; Anxiety disorder in her father, sister, and son; Arthritis in her maternal grandmother and paternal grandmother; Cancer in her maternal grandmother and another family member; Depression in her father, sister, sister, and son; Drug abuse in her son; Hearing loss in her mother; Heart attack in an other family member; Heart disease in her  maternal grandfather and paternal grandfather; Hypertension in her mother; Obesity in her sister and sister; Peripheral vascular disease in her father; Stroke in an other family member; Thrombosis in her father. ROS:   Please see the history of present illness.    All 14 point review of systems negative except as described per history of present illness  EKGs/Labs/Other Studies Reviewed:    EKG Interpretation Date/Time:  Wednesday April 12 2024 13:47:18 EDT Ventricular Rate:  60 PR Interval:  148 QRS Duration:  78 QT Interval:  398 QTC Calculation: 398 R Axis:   5  Text Interpretation: Normal sinus rhythm Low voltage QRS Cannot rule out Anterior infarct (cited on or before 12-Apr-2024) When compared with ECG of 21-Jan-2023 11:43, No significant change was found Confirmed by Ralene Burger [phone]) on 04/12/2024 1:54:32 PM    Recent Labs: 03/28/2024: ALT 19; BUN 13; Creatinine, Ser 0.99; Potassium 4.2; Sodium 142  Recent Lipid Panel    Component Value Date/Time   CHOL 169 07/16/2023 1513   TRIG 124 07/16/2023 1513   HDL 77 07/16/2023 1513   CHOLHDL 2.2 07/16/2023 1513   CHOLHDL 2.0 06/19/2022 0000   VLDL 31 (H) 07/17/2015 0806   LDLCALC 71 07/16/2023 1513   LDLCALC 61 06/19/2022 0000    Physical Exam:    VS:  BP 130/80 (BP Location: Right Arm, Patient Position: Sitting)   Pulse (!) 58   Ht 5\' 6"  (1.676 m)   Wt 214 lb 9 oz (97.3 kg)   SpO2 97%   BMI 34.63 kg/m     Wt Readings from Last 3 Encounters:  04/12/24 214 lb 9 oz (97.3 kg)  03/28/24 214 lb (97.1 kg)  03/22/24 210 lb (95.3 kg)     GEN:  Well nourished, well developed in no acute distress HEENT: Normal NECK: No JVD; No carotid bruits LYMPHATICS: No lymphadenopathy CARDIAC: RRR, no murmurs, no rubs, no gallops RESPIRATORY:  Clear to auscultation without rales, wheezing or rhonchi  ABDOMEN: Soft, non-tender, non-distended MUSCULOSKELETAL:  No edema; No  deformity  SKIN: Warm and dry LOWER EXTREMITIES: no  swelling NEUROLOGIC:  Alert and oriented x 3 PSYCHIATRIC:  Normal affect   ASSESSMENT:    1. Palpitations   2. Mixed hyperlipidemia   3. Essential hypertension   4. OSA (obstructive sleep apnea)    PLAN:    In order of problems listed above:  Palpitations very rare does not want to take any medications and does not need to do this I told her if she develops some symptoms that is bothering her she need to let me know. Mixed dyslipidemia I did review K PN which show me her LDL 71 HDL 77 continue present management. Essential hypertension blood pressure well-controlled continue present management. Diabetes last hemoglobin A1c 6.1 with decent control.  I encouraged her to be active sadly she tripped at home and broke her big toe but is gradually getting better   Medication Adjustments/Labs and Tests Ordered: Current medicines are reviewed at length with the patient today.  Concerns regarding medicines are outlined above.  Orders Placed This Encounter  Procedures   EKG 12-Lead   Medication changes: No orders of the defined types were placed in this encounter.   Signed, Manfred Seed, MD, [facility] 04/12/2024 2:01 PM    [facility]

## 2024-05-29 ENCOUNTER — Other Ambulatory Visit: Payer: Self-pay | Admitting: Family Medicine

## 2024-05-29 DIAGNOSIS — E1169 Type 2 diabetes mellitus with other specified complication: Secondary | ICD-10-CM

## 2024-05-29 DIAGNOSIS — E785 Hyperlipidemia, unspecified: Secondary | ICD-10-CM

## 2024-05-30 ENCOUNTER — Encounter: Payer: Self-pay | Admitting: Family Medicine

## 2024-06-05 LAB — HM DIABETES EYE EXAM

## 2024-06-19 ENCOUNTER — Encounter: Payer: Self-pay | Admitting: Family Medicine

## 2024-06-19 ENCOUNTER — Ambulatory Visit: Admitting: Family Medicine

## 2024-06-22 ENCOUNTER — Ambulatory Visit (INDEPENDENT_AMBULATORY_CARE_PROVIDER_SITE_OTHER): Admitting: Family Medicine

## 2024-06-22 ENCOUNTER — Encounter: Payer: Self-pay | Admitting: Family Medicine

## 2024-06-22 VITALS — BP 127/56 | HR 59 | Ht 66.0 in | Wt 216.1 lb

## 2024-06-22 DIAGNOSIS — D692 Other nonthrombocytopenic purpura: Secondary | ICD-10-CM | POA: Diagnosis not present

## 2024-06-22 DIAGNOSIS — E1169 Type 2 diabetes mellitus with other specified complication: Secondary | ICD-10-CM

## 2024-06-22 DIAGNOSIS — E669 Obesity, unspecified: Secondary | ICD-10-CM | POA: Diagnosis not present

## 2024-06-22 DIAGNOSIS — Z7984 Long term (current) use of oral hypoglycemic drugs: Secondary | ICD-10-CM | POA: Diagnosis not present

## 2024-06-22 DIAGNOSIS — I10 Essential (primary) hypertension: Secondary | ICD-10-CM

## 2024-06-22 DIAGNOSIS — E782 Mixed hyperlipidemia: Secondary | ICD-10-CM | POA: Diagnosis not present

## 2024-06-22 LAB — POCT GLYCOSYLATED HEMOGLOBIN (HGB A1C): Hemoglobin A1C: 6.1 % — AB (ref 4.0–5.6)

## 2024-06-22 NOTE — Progress Notes (Signed)
   Established Patient Office Visit  Subjective  Patient ID: Yvonne Long, female    DOB: 25-May-1951  Age: 73 y.o. MRN: [id number]  Chief Complaint  Patient presents with   Hypertension   Diabetes    HPI  Hypertension- Pt denies chest pain, SOB, dizziness, or heart palpitations.  Taking meds as directed w/o problems.  Denies medication side effects.    Diabetes - no hypoglycemic events. No wounds or sores that are not healing well. No increased thirst or urination. Checking glucose at home. Taking medications as prescribed without any side effects.     ROS    Objective:     BP (!) 127/56   Pulse (!) 59   Ht 5' 6 (1.676 m)   Wt 216 lb 1.9 oz (98 kg)   SpO2 96%   BMI 34.88 kg/m    Physical Exam Vitals and nursing note reviewed.  Constitutional:      Appearance: Normal appearance.  HENT:     Head: Normocephalic and atraumatic.  Eyes:     Conjunctiva/sclera: Conjunctivae normal.  Cardiovascular:     Rate and Rhythm: Normal rate and regular rhythm.  Pulmonary:     Effort: Pulmonary effort is normal.     Breath sounds: Normal breath sounds.  Skin:    General: Skin is warm and dry.  Neurological:     Mental Status: She is alert.  Psychiatric:        Mood and Affect: Mood normal.      Results for orders placed or performed in visit on 06/22/24  POCT HgB A1C  Result Value Ref Range   Hemoglobin A1C 6.1 (A) 4.0 - 5.6 %   HbA1c POC (<> result, manual entry)     HbA1c, POC (prediabetic range)     HbA1c, POC (controlled diabetic range)        The 10-year ASCVD risk score (Arnett DK, et al., 2019) is: 22.9%    Assessment & Plan:   Problem List Items Addressed This Visit       Cardiovascular and Mediastinum   Senile purpura (HCC)   She bruises very easily on her forearms.  She just got her left one to heal up completely and then her dogs jumped on her and it started to bruise again.      Relevant Orders   Urine Microalbumin w/creat. ratio    Lipid panel   CBC   Essential hypertension - Primary   Pressure looks fantastic today.  Due for some updated labs continue current regimen.      Relevant Orders   Urine Microalbumin w/creat. ratio   Lipid panel   CBC     Endocrine   Type 2 diabetes mellitus with obesity (HCC)   A1c looks good today at 6.1 currently on metformin  continue to work on healthy diet and regular exercise      Relevant Orders   POCT HgB A1C (Completed)   Urine Microalbumin w/creat. ratio   Lipid panel   CBC     Other   Hyperlipidemia   Due for updated lipid panel.      Relevant Orders   Urine Microalbumin w/creat. ratio   Lipid panel   CBC    Return in about 4 months (around 10/23/2024) for Diabetes follow-up, Hypertension.    Dorothyann Byars, MD

## 2024-06-22 NOTE — Assessment & Plan Note (Signed)
 Due for updated lipid panel

## 2024-06-22 NOTE — Assessment & Plan Note (Signed)
 A1c looks good today at 6.1 currently on metformin  continue to work on healthy diet and regular exercise

## 2024-06-22 NOTE — Assessment & Plan Note (Signed)
 Pressure looks fantastic today.  Due for some updated labs continue current regimen.

## 2024-06-22 NOTE — Assessment & Plan Note (Signed)
 She bruises very easily on her forearms.  She just got her left one to heal up completely and then her dogs jumped on her and it started to bruise again.

## 2024-06-24 LAB — CBC
Hematocrit: 43.2 % (ref 34.0–46.6)
Hemoglobin: 13.7 g/dL (ref 11.1–15.9)
MCH: 29.3 pg (ref 26.6–33.0)
MCHC: 31.7 g/dL (ref 31.5–35.7)
MCV: 92 fL (ref 79–97)
Platelets: 281 x10E3/uL (ref 150–450)
RBC: 4.68 x10E6/uL (ref 3.77–5.28)
RDW: 13.5 % (ref 11.7–15.4)
WBC: 9.6 x10E3/uL (ref 3.4–10.8)

## 2024-06-24 LAB — MICROALBUMIN / CREATININE URINE RATIO
Creatinine, Urine: 66.7 mg/dL
Microalb/Creat Ratio: 5 mg/g{creat} (ref 0–29)
Microalbumin, Urine: 3.1 ug/mL

## 2024-06-24 LAB — LIPID PANEL
Chol/HDL Ratio: 2.5 ratio (ref 0.0–4.4)
Cholesterol, Total: 144 mg/dL (ref 100–199)
HDL: 57 mg/dL (ref >39)
LDL Chol Calc (NIH): 66 mg/dL (ref 0–99)
Triglycerides: 118 mg/dL (ref 0–149)
VLDL Cholesterol Cal: 21 mg/dL (ref 5–40)

## 2024-06-25 ENCOUNTER — Ambulatory Visit: Payer: Self-pay | Admitting: Family Medicine

## 2024-06-25 NOTE — Progress Notes (Signed)
 Hi Angie, no excess protein in the urine which is great.  Cholesterol and blood count look wonderful.

## 2024-07-18 ENCOUNTER — Ambulatory Visit: Admitting: Family Medicine

## 2024-07-22 ENCOUNTER — Encounter: Payer: Self-pay | Admitting: Family Medicine

## 2024-07-25 ENCOUNTER — Encounter: Payer: Self-pay | Admitting: Family Medicine

## 2024-07-28 ENCOUNTER — Ambulatory Visit: Admitting: Family Medicine

## 2024-07-29 ENCOUNTER — Encounter: Payer: Self-pay | Admitting: Family Medicine

## 2024-08-03 MED ORDER — AMLODIPINE BESYLATE 2.5 MG PO TABS: 2.5000 mg | ORAL_TABLET | Freq: Every day | ORAL | 0 refills | Status: DC

## 2024-08-03 NOTE — Addendum Note (Signed)
 Addended by: Sky Primo D on: 08/03/2024 07:59 AM   Modules accepted: Orders

## 2024-08-06 ENCOUNTER — Other Ambulatory Visit: Payer: Self-pay | Admitting: Family Medicine

## 2024-08-06 DIAGNOSIS — F330 Major depressive disorder, recurrent, mild: Secondary | ICD-10-CM

## 2024-08-19 ENCOUNTER — Encounter: Payer: Self-pay | Admitting: Cardiology

## 2024-08-20 DIAGNOSIS — J019 Acute sinusitis, unspecified: Secondary | ICD-10-CM | POA: Diagnosis not present

## 2024-08-22 NOTE — Telephone Encounter (Signed)
 She finished the antibiotic yet?  If not then I would say maybe give it a couple of days.  Or her if her ears are still hurting we can always take another look.

## 2024-08-23 ENCOUNTER — Other Ambulatory Visit: Payer: Self-pay | Admitting: Family Medicine

## 2024-08-23 ENCOUNTER — Telehealth: Payer: Self-pay

## 2024-08-23 DIAGNOSIS — F330 Major depressive disorder, recurrent, mild: Secondary | ICD-10-CM

## 2024-08-23 NOTE — Telephone Encounter (Signed)
 Copied from CRM #[id number]. Topic: Clinical - Prescription Issue >> Aug 23, 2024  1:01 PM Merlynn A wrote: Reason for CRM: Patient called in regarding medication refill for PARoxetine  (PAXIL ) 30 MG tablet. Advised patient that refills can take up to 3 business days to be completed. Patient understood and stated she is out of medication and starting to feel sick. Please refill asap for patient.

## 2024-08-23 NOTE — Telephone Encounter (Unsigned)
 Copied from CRM #[id number]. Topic: Clinical - Medication Refill >> Aug 23, 2024 10:39 AM Carrielelia G wrote: Medication: PARoxetine  (PAXIL ) 30 MG tablet  Has the patient contacted their pharmacy? Yes (Agent: If no, request that the patient contact the pharmacy for the refill. If patient does not wish to contact the pharmacy document the reason why and proceed with request.) (Agent: If yes, when and what did the pharmacy advise?)  This is the patient's preferred pharmacy:   [phone] - [place], [place] - [address]  [place] [address] Phone: [phone] Fax: [phone]  IIs the patient out of the medication? Yes  Has the patient been seen for an appointment in the last year OR does the patient have an upcoming appointment? Yes  Can we respond through MyChart? Yes  Agent: Please be advised that Rx refills may take up to 3 business days. We ask that you follow-up with your pharmacy.

## 2024-08-23 NOTE — Telephone Encounter (Signed)
 Spoke with patient  Informed her that  refills were went on 08/182025 as 90 day supply with 3 refills She had already spoken with [facility] who told her they had lost her rx but have now found it  They are overnighting her a prescription of the medication. She should receive  this tomorrow.

## 2024-08-25 ENCOUNTER — Other Ambulatory Visit: Payer: Self-pay

## 2024-08-25 MED ORDER — AZITHROMYCIN 250 MG PO TABS: ORAL_TABLET | ORAL | 0 refills | Status: AC

## 2024-08-25 MED ORDER — AZITHROMYCIN 250 MG PO TABS
ORAL_TABLET | ORAL | 0 refills | Status: DC
Start: 2024-08-25 — End: 2024-08-25

## 2024-08-25 NOTE — Addendum Note (Signed)
 Addended by: Daveyon Kitchings D on: 08/25/2024 01:57 PM   Modules accepted: Orders

## 2024-08-27 ENCOUNTER — Other Ambulatory Visit: Payer: Self-pay | Admitting: Family Medicine

## 2024-08-27 DIAGNOSIS — N3281 Overactive bladder: Secondary | ICD-10-CM

## 2024-09-12 ENCOUNTER — Other Ambulatory Visit: Payer: Self-pay | Admitting: Family Medicine

## 2024-09-20 NOTE — Progress Notes (Signed)
 Yvonne Long                                          MRN: [id number]   09/20/2024   The [facility] Quality Team Specialist reviewed this patient medical record for the purposes of chart review for care gap closure. The following were reviewed: chart review for care gap closure-breast cancer screening.    [facility] Quality Team

## 2024-09-28 ENCOUNTER — Other Ambulatory Visit: Payer: Self-pay | Admitting: Family Medicine

## 2024-09-29 ENCOUNTER — Encounter: Payer: Self-pay | Admitting: Family Medicine

## 2024-10-05 ENCOUNTER — Ambulatory Visit: Payer: Self-pay

## 2024-10-05 NOTE — Telephone Encounter (Signed)
 FYI Only or Action Required?: FYI only for provider.  Patient was last seen in primary care on 06/22/2024 by Alvan Dorothyann BIRCH, MD.  Called Nurse Triage reporting Foot Swelling.  Symptoms began about a month ago.  Interventions attempted: Rest, hydration, or home remedies.  Symptoms are: unchanged.  Triage Disposition: See Physician Within 24 Hours  Patient/caregiver understands and will follow disposition?: No, refuses disposition RN advising visit within 24 hours. Pt declined scheduling with other providers, prefers PCP office only. Went ahead and scheduled appt. Care advice given. Pt verbalized her understanding Copied from CRM #[id number]. Topic: Clinical - Red Word Triage >> Oct 05, 2024  2:23 PM Amy B wrote: Red Word that prompted transfer to Nurse Triage: bilateral foot swelling for one month Reason for Disposition  [1] MODERATE leg swelling (e.g., swelling extends up to knees) AND [2] new-onset or getting worse  Answer Assessment - Initial Assessment Questions 1. ONSET: When did the swelling start? (e.g., minutes, hours, days)     1 month  2. LOCATION: What part of the leg is swollen?  Are both legs swollen or just one leg?     Both legs, from below knee to foot  3. SEVERITY: How bad is the swelling? (e.g., localized; mild, moderate, severe)     Mild- imporves with elevation  4. REDNESS: Is there redness or signs of infection?     No  5. PAIN: Is the swelling painful to touch? If Yes, ask: How painful is it?   (Scale 1-10; mild, moderate or severe)     No  6. FEVER: Do you have a fever? If Yes, ask: What is it, how was it measured, and when did it start?      No  7. CAUSE: What do you think is causing the leg swelling?     Unsure of cause  8. MEDICAL HISTORY: Do you have a history of blood clots (e.g., DVT), cancer, heart failure, kidney disease, or liver failure?     Had recurrent kidney infections, but not diagnosed with kidney disease  9.  RECURRENT SYMPTOM: Have you had leg swelling before? If Yes, ask: When was the last time? What happened that time?     No  10. OTHER SYMPTOMS: Do you have any other symptoms? (e.g., chest pain, difficulty breathing)       No  11. PREGNANCY: Is there any chance you are pregnant? When was your last menstrual period?       no  Protocols used: Leg Swelling and Edema-A-AH

## 2024-10-06 DIAGNOSIS — M7541 Impingement syndrome of right shoulder: Secondary | ICD-10-CM | POA: Diagnosis not present

## 2024-10-09 ENCOUNTER — Encounter: Payer: Self-pay | Admitting: Urgent Care

## 2024-10-09 ENCOUNTER — Ambulatory Visit: Admitting: Urgent Care

## 2024-10-09 VITALS — BP 152/76 | HR 74 | Ht 66.0 in | Wt 217.0 lb

## 2024-10-09 DIAGNOSIS — S90121A Contusion of right lesser toe(s) without damage to nail, initial encounter: Secondary | ICD-10-CM

## 2024-10-09 DIAGNOSIS — R609 Edema, unspecified: Secondary | ICD-10-CM

## 2024-10-09 MED ORDER — FUROSEMIDE 20 MG PO TABS
20.0000 mg | ORAL_TABLET | Freq: Every day | ORAL | 0 refills | Status: DC
Start: 2024-10-09 — End: 2024-10-16

## 2024-10-09 NOTE — Progress Notes (Signed)
 Established Patient Office Visit  Subjective:  Patient ID: Yvonne Long, female    DOB: 12-Jun-1951  Age: 73 y.o. MRN: [id number]  Chief Complaint  Patient presents with   Leg Swelling    Bilateral from knee down for the past month off and on    HPI  Discussed the use of AI scribe software for clinical note transcription with the patient, who gave verbal consent to proceed.  History of Present Illness   Yvonne Long is a 73 year old female who presents with bilateral leg swelling below the knee (primarily just the feet) for at least one month.  She has been experiencing bilateral leg swelling below the knee for at least one month. The swelling worsens with standing and progresses throughout the day. Elevating her legs provides some relief. She has increased her water  intake and reduced her salt intake, but these changes have not led to significant improvement.  No leg pain, decreased sensation, discoloration, or weakness. No shortness of breath, chest pain, or orthopnea. She mentions a bruised pinky toe from a separate incident. There is no significant discoloration or warmth associated with the swelling. No hx of CHF (last ECHO showed EF 65%), cirrhosis or kidney disease).     Patient Active Problem List   Diagnosis Date Noted   Essential hypertension 07/16/2023   Right upper quadrant pain 02/26/2023   Localized osteoarthritis of right knee 02/03/2023   Encounter for weight management 08/12/2022   Generalized abdominal pain 07/14/2022   Senile purpura 06/18/2022   Benign essential microscopic hematuria 06/03/2021   Mid back pain on right side 06/02/2021   Herpes simplex infection 07/17/2019   OSA (obstructive sleep apnea) 11/11/2018   Thoracic back pain 02/12/2016   Type 2 diabetes mellitus with obesity 04/16/2015   Hyperlipidemia 07/24/2008   PALPITATIONS 07/05/2007   Major depressive disorder, recurrent episode 09/28/2006   Past Medical History:   Diagnosis Date   Allergy    Pollen, dust   Anxiety    Perform exercise to control   Cataract    Will probably have them removed soon.   Chronic kidney disease (CKD) stage G3b/A1, moderately decreased glomerular filtration rate (GFR) between 30-44 mL/min/1.73 square meter and albuminuria creatinine ratio less than 30 mg/g (HCC) 03/19/2016   COVID 04/10/2022   Depression    Diabetes mellitus without complication (HCC)    Encounter for weight management 08/12/2022   GERD (gastroesophageal reflux disease)    Herpes simplex infection 07/17/2019   Oral and genital   Hyperlipidemia 07/24/2008   Qualifier: Diagnosis of  By: Chrys RN, Paula     Hypertension    Take meds to control   Left knee pain 11/20/2015   Major depressive disorder, recurrent episode 09/28/2006   Qualifier: Diagnosis of  By: Alvan MD, Catherine     OSA (obstructive sleep apnea) 11/11/2018   Study 10/2018 IMPRESSIONS - Mild obstructive sleep apnea occurred during this study (AHI = 14.2/h). - No significant central sleep apnea occurred during this study (CAI = 0.0/h). - Oxygen desaturation was noted during this study (Min O2 = 85%). - Patient snored.   Palpitations 07/05/2007   Qualifier: Diagnosis of  By: Alvan MD, Dorothyann     Primary localized osteoarthritis of left knee 11/02/2018   Sleep apnea    Use CPAP   Past Surgical History:  Procedure Laterality Date   ABDOMINAL HYSTERECTOMY  2005   Dr. Eldonna   BILATERAL SALPINGOOPHORECTOMY  2005   Dr. Buster  CHOLECYSTECTOMY  2013   CYSTOCELE REPAIR  2005   with bladder tack   JOINT REPLACEMENT  2021   Left knee   SPINE SURGERY  01/2010   Lumbar Laminectomy-Dr. Delores   TOTAL KNEE ARTHROPLASTY Left 11/14/2018   Procedure: left TOTAL KNEE ARTHROPLASTY;  Surgeon: Jane Charleston, MD;  Location: [facility] OR;  Service: Orthopedics;  Laterality: Left;   TOTAL KNEE ARTHROPLASTY Right 02/03/2023   Procedure: TOTAL KNEE ARTHROPLASTY;  Surgeon: Edna Toribio LABOR, MD;  Location: [facility] ORS;  Service: Orthopedics;  Laterality: Right;   Social History   Tobacco Use   Smoking status: Former    Current packs/day: 0.00    Average packs/day: 1 pack/day for 15.0 years (15.0 ttl pk-yrs)    Types: Cigarettes    Start date: 12/22/1979    Quit date: 12/21/1994    Years since quitting: 29.8   Smokeless tobacco: Never  Vaping Use   Vaping status: Never Used  Substance Use Topics   Alcohol  use: Yes    Alcohol /week: 7.0 standard drinks of alcohol     Types: 7 Glasses of wine per week    Comment: 1 alcoholic drink per day   Drug use: No      ROS: as noted in HPI  Objective:     BP (!) 152/76   Pulse 74   Ht 5' 6 (1.676 m)   Wt 217 lb (98.4 kg)   SpO2 95%   BMI 35.02 kg/m  BP Readings from Last 3 Encounters:  10/09/24 (!) 152/76  06/22/24 (!) 127/56  04/12/24 130/80   Wt Readings from Last 3 Encounters:  10/09/24 217 lb (98.4 kg)  06/22/24 216 lb 1.9 oz (98 kg)  04/12/24 214 lb 9 oz (97.3 kg)      Physical Exam Vitals and nursing note reviewed.  Constitutional:      General: She is not in acute distress.    Appearance: Normal appearance. She is not ill-appearing.  Cardiovascular:     Rate and Rhythm: Normal rate and regular rhythm.     Pulses: Normal pulses.     Heart sounds: No murmur heard. Pulmonary:     Effort: Pulmonary effort is normal. No respiratory distress.     Breath sounds: Normal breath sounds. No stridor. No wheezing.  Musculoskeletal:        General: Signs of injury (R pinky toe injury noted - ecchymosis, however FROM) present. No tenderness. Normal range of motion.     Right lower leg: Edema (mild pitting) present.     Left lower leg: Edema (mild pitting) present.     Comments: Negative homan sign Both calves 14.5 bilaterally No warmth or erythema  Skin:    General: Skin is warm and dry.  Neurological:     Mental Status: She is alert and oriented to person, place, and time.     Gait: Gait normal.  Psychiatric:         Mood and Affect: Mood normal.        Behavior: Behavior normal.      No results found for any visits on 10/09/24.  Last CBC Lab Results  Component Value Date   WBC 9.6 06/22/2024   HGB 13.7 06/22/2024   HCT 43.2 06/22/2024   MCV 92 06/22/2024   MCH 29.3 06/22/2024   RDW 13.5 06/22/2024   PLT 281 06/22/2024   Last metabolic panel Lab Results  Component Value Date   GLUCOSE 142 (H) 03/28/2024   NA 142 03/28/2024  K 4.2 03/28/2024   CL 102 03/28/2024   CO2 20 03/28/2024   BUN 13 03/28/2024   CREATININE 0.99 03/28/2024   EGFR 61 03/28/2024   CALCIUM  9.3 03/28/2024   PROT 6.6 03/28/2024   ALBUMIN 4.3 03/28/2024   LABGLOB 2.3 03/28/2024   BILITOT 0.3 03/28/2024   ALKPHOS 85 03/28/2024   AST 14 03/28/2024   ALT 19 03/28/2024   ANIONGAP 10 02/04/2023   Last lipids Lab Results  Component Value Date   CHOL 144 06/22/2024   HDL 57 06/22/2024   LDLCALC 66 06/22/2024   TRIG 118 06/22/2024   CHOLHDL 2.5 06/22/2024   Last hemoglobin A1c Lab Results  Component Value Date   HGBA1C 6.1 (A) 06/22/2024   Last thyroid  functions Lab Results  Component Value Date   TSH 0.93 11/28/2020   FREET4 1.3 11/28/2020   Last vitamin D  No results found for: 25OHVITD2, 25OHVITD3, VD25OH Last vitamin B12 and Folate No results found for: VITAMINB12, FOLATE    The 10-year ASCVD risk score (Arnett DK, et al., 2019) is: 34.8%  Assessment & Plan:  Dependent edema -     Furosemide; Take 1 tablet (20 mg total) by mouth daily.  Dispense: 7 tablet; Refill: 0  Contusion of fifth toe of right foot, initial encounter  Assessment and Plan    Bilateral lower extremity edema, likely due to dependency.  Pitting edema noted. No pain, discoloration, or systemic symptoms. Heart and lung exams unremarkable. Elevation provides relief - elevate legs as seen in picture - use compression hose - lasix 20mg  daily x 7 days - return for more extensive workup if sx persist/  worsen.        No follow-ups on file.   Benton LITTIE Gave, PA

## 2024-10-09 NOTE — Patient Instructions (Addendum)
  Please elevate your legs above the level of your heart as seen in photo above. Please purchase mid-shin compression hose and use this every morning.  Take lasix once daily in the morning. You may want to eat a banana daily while taking this medication.  If you continue having symptoms >7 days, please notify us  for additional labs/ bloodwork.

## 2024-10-13 ENCOUNTER — Ambulatory Visit

## 2024-10-13 ENCOUNTER — Encounter: Payer: Self-pay | Admitting: Family Medicine

## 2024-10-13 DIAGNOSIS — R609 Edema, unspecified: Secondary | ICD-10-CM

## 2024-10-15 ENCOUNTER — Other Ambulatory Visit: Payer: Self-pay | Admitting: Family Medicine

## 2024-10-16 MED ORDER — FUROSEMIDE 20 MG PO TABS: 20.0000 mg | ORAL_TABLET | Freq: Every day | ORAL | 0 refills | Status: DC | PRN

## 2024-10-17 ENCOUNTER — Other Ambulatory Visit: Payer: Self-pay | Admitting: Medical Genetics

## 2024-10-23 ENCOUNTER — Encounter: Payer: Self-pay | Admitting: Family Medicine

## 2024-10-23 ENCOUNTER — Ambulatory Visit: Admitting: Family Medicine

## 2024-10-23 VITALS — BP 128/61 | HR 56 | Ht 66.0 in | Wt 209.1 lb

## 2024-10-23 DIAGNOSIS — E669 Obesity, unspecified: Secondary | ICD-10-CM | POA: Diagnosis not present

## 2024-10-23 DIAGNOSIS — E782 Mixed hyperlipidemia: Secondary | ICD-10-CM | POA: Diagnosis not present

## 2024-10-23 DIAGNOSIS — F330 Major depressive disorder, recurrent, mild: Secondary | ICD-10-CM

## 2024-10-23 DIAGNOSIS — E119 Type 2 diabetes mellitus without complications: Secondary | ICD-10-CM

## 2024-10-23 DIAGNOSIS — Z1231 Encounter for screening mammogram for malignant neoplasm of breast: Secondary | ICD-10-CM

## 2024-10-23 DIAGNOSIS — N1832 Chronic kidney disease, stage 3b: Secondary | ICD-10-CM

## 2024-10-23 DIAGNOSIS — Z7689 Persons encountering health services in other specified circumstances: Secondary | ICD-10-CM

## 2024-10-23 DIAGNOSIS — I10 Essential (primary) hypertension: Secondary | ICD-10-CM

## 2024-10-23 DIAGNOSIS — Z7984 Long term (current) use of oral hypoglycemic drugs: Secondary | ICD-10-CM

## 2024-10-23 LAB — POCT GLYCOSYLATED HEMOGLOBIN (HGB A1C): Hemoglobin A1C: 6.6 % — AB (ref 4.0–5.6)

## 2024-10-23 NOTE — Assessment & Plan Note (Signed)
  Hemoglobin A1c increased from 6.1% to 6.6% due to increased sweets consumption, despite weight loss. - Encouraged reduction of sweets intake, substitute with protein-rich snacks. - Advised increased physical activity, including walking and exercise with bands.

## 2024-10-23 NOTE — Progress Notes (Signed)
 Established Patient Office Visit  Patient ID: Yvonne Long, female    DOB: 11-Dec-1951  Age: 73 y.o. MRN: [id number] PCP: Alvan Dorothyann BIRCH, MD  Chief Complaint  Patient presents with   Diabetes   Hypertension    Subjective:     HPI  Discussed the use of AI scribe software for clinical note transcription with the patient, who gave verbal consent to proceed.  History of Present Illness Yvonne Long is a 73 year old female who presents for a follow-up visit to discuss weight management and diabetes control.  Weight loss and appetite changes - Significant weight loss attributed to use of kale tablets, glucosamine, and zinc - Decreased appetite - Increased energy levels  Peripheral edema - History of swelling in feet, previously resolved after taking a prescribed medication - Keeps medication available for potential recurrence of swelling  DM - Hemoglobin A1c increased from 6.1% to 6.6% - Increase in A1c attributed to increased consumption of sweets, especially during travel - Presence of sweets at home due to both patient and husband having a sweet tooth - Feels better and more active despite elevated A1c  Blood pressure monitoring - Occasional home blood pressure checks with good readings - No chest discomfort, palpitations, or palpitations  Urinary symptoms - Previously used VESIcare  for bladder symptoms, discontinued as no longer necessary  Mood - Mood is good     ROS    Objective:     BP 128/61   Pulse (!) 56   Ht 5' 6 (1.676 m)   Wt 209 lb 1.9 oz (94.9 kg)   SpO2 99%   BMI 33.75 kg/m    Physical Exam Vitals and nursing note reviewed.  Constitutional:      Appearance: Normal appearance.  HENT:     Head: Normocephalic and atraumatic.  Eyes:     Conjunctiva/sclera: Conjunctivae normal.  Cardiovascular:     Rate and Rhythm: Normal rate and regular rhythm.  Pulmonary:     Effort: Pulmonary effort is normal.      Breath sounds: Normal breath sounds.  Skin:    General: Skin is warm and dry.  Neurological:     Mental Status: She is alert.  Psychiatric:        Mood and Affect: Mood normal.      Results for orders placed or performed in visit on 10/23/24  POCT HgB A1C  Result Value Ref Range   Hemoglobin A1C 6.6 (A) 4.0 - 5.6 %   HbA1c POC (<> result, manual entry)     HbA1c, POC (prediabetic range)     HbA1c, POC (controlled diabetic range)        The 10-year ASCVD risk score (Arnett DK, et al., 2019) is: 26.1%    Assessment & Plan:   Problem List Items Addressed This Visit       Cardiovascular and Mediastinum   Essential hypertension - Primary   Well controlled. Continue current regimen. Follow up in  66mo       Relevant Orders   POCT HgB A1C (Completed)   CMP14+EGFR     Endocrine   Type 2 diabetes mellitus in patient with obesity (HCC)    Hemoglobin A1c increased from 6.1% to 6.6% due to increased sweets consumption, despite weight loss. - Encouraged reduction of sweets intake, substitute with protein-rich snacks. - Advised increased physical activity, including walking and exercise with bands.      Relevant Orders   POCT HgB A1C (Completed)  Other   Major depressive disorder, recurrent episode   Stable. Doing well. Continue Paxil        Hyperlipidemia   Relevant Orders   POCT HgB A1C (Completed)   CMP14+EGFR   Encounter for weight management   Down 8 lbs on kale supplement and zince.        Other Visit Diagnoses       Screening mammogram for breast cancer       Relevant Orders   MM 3D SCREENING MAMMOGRAM BILATERAL BREAST       Assessment and Plan Assessment & Plan   Essential hypertension Blood pressure well-controlled with no cardiovascular symptoms reported.  Lower extremity edema, improved Edema resolved with medication; weight loss may have improved venous stasis.  General Health Maintenance Mammogram overdue, last done two years ago.  Recommended continuation until age 60, with consideration beyond based on health status and preference. - Schedule mammogram in person or via MyChart.    Return in about 4 months (around 02/20/2025) for Diabetes follow-up.    Dorothyann Byars, MD [facility] at [facility]

## 2024-10-23 NOTE — Assessment & Plan Note (Signed)
 Down 8 lbs on kale supplement and zince.

## 2024-10-23 NOTE — Assessment & Plan Note (Signed)
 Well controlled. Continue current regimen. Follow up in  6 mo

## 2024-10-23 NOTE — Assessment & Plan Note (Addendum)
 Stable. Doing well. Continue Paxil 

## 2024-10-24 ENCOUNTER — Ambulatory Visit: Payer: Self-pay | Admitting: Family Medicine

## 2024-10-24 LAB — CMP14+EGFR
ALT: 22 IU/L (ref 0–32)
AST: 13 IU/L (ref 0–40)
Albumin: 4.1 g/dL (ref 3.8–4.8)
Alkaline Phosphatase: 90 IU/L (ref 49–135)
BUN/Creatinine Ratio: 16 (ref 12–28)
BUN: 13 mg/dL (ref 8–27)
Bilirubin Total: 0.5 mg/dL (ref 0.0–1.2)
CO2: 22 mmol/L (ref 20–29)
Calcium: 9.6 mg/dL (ref 8.7–10.3)
Chloride: 103 mmol/L (ref 96–106)
Creatinine, Ser: 0.83 mg/dL (ref 0.57–1.00)
Globulin, Total: 2.6 g/dL (ref 1.5–4.5)
Glucose: 114 mg/dL — ABNORMAL HIGH (ref 70–99)
Potassium: 4.6 mmol/L (ref 3.5–5.2)
Sodium: 139 mmol/L (ref 134–144)
Total Protein: 6.7 g/dL (ref 6.0–8.5)
eGFR: 75 mL/min/1.73 (ref >59)

## 2024-10-24 NOTE — Progress Notes (Signed)
 Your lab work is within acceptable range and there are no concerning findings.   ?

## 2024-11-07 ENCOUNTER — Ambulatory Visit

## 2024-11-09 ENCOUNTER — Other Ambulatory Visit: Payer: Self-pay | Admitting: Family Medicine

## 2024-11-09 DIAGNOSIS — R609 Edema, unspecified: Secondary | ICD-10-CM

## 2024-11-29 ENCOUNTER — Other Ambulatory Visit

## 2024-12-01 ENCOUNTER — Telehealth: Payer: Self-pay

## 2024-12-01 NOTE — Telephone Encounter (Signed)
 Copied from CRM #[id number]. Topic: Clinical - Medication Question >> Dec 01, 2024 11:45 AM Winona SAUNDERS wrote: Abby from [employer] calling to ask the provider to review the medications below as some may have side effects on the pt. PARoxetine  (PAXIL ) 30 MG tablet, Cyclodenzaprine if still active

## 2024-12-06 NOTE — Telephone Encounter (Signed)
 We don't have cyclobenzaprine  on her med list, so I think that may be old

## 2024-12-13 ENCOUNTER — Other Ambulatory Visit: Payer: Self-pay | Admitting: Family Medicine

## 2024-12-13 DIAGNOSIS — E669 Obesity, unspecified: Secondary | ICD-10-CM

## 2024-12-13 DIAGNOSIS — E785 Hyperlipidemia, unspecified: Secondary | ICD-10-CM

## 2025-01-26 ENCOUNTER — Other Ambulatory Visit: Payer: Self-pay | Admitting: *Deleted

## 2025-01-26 ENCOUNTER — Other Ambulatory Visit: Payer: Self-pay | Admitting: Family Medicine

## 2025-01-26 MED ORDER — AMLODIPINE BESYLATE 2.5 MG PO TABS: 2.5000 mg | ORAL_TABLET | Freq: Every day | ORAL | 1 refills | Status: AC

## 2025-01-26 NOTE — Telephone Encounter (Signed)
 Task completed. Medication sent

## 2025-01-26 NOTE — Telephone Encounter (Unsigned)
 Copied from CRM #[id number]. Topic: Clinical - Medication Refill >> Jan 26, 2025  3:19 PM Everette C wrote: Medication: amLODipine  (NORVASC ) 2.5 MG tablet [[id number]]  Has the patient contacted their pharmacy? Yes (Agent: If no, request that the patient contact the pharmacy for the refill. If patient does not wish to contact the pharmacy document the reason why and proceed with request.) (Agent: If yes, when and what did the pharmacy advise?)  This is the patient's preferred pharmacy:  [phone] - [place], [place] - [address] [place] [place] [address] Phone: [phone] Fax: [phone]  Is this the correct pharmacy for this prescription? Yes If no, delete pharmacy and type the correct one.   Has the prescription been filled recently? No  Is the patient out of the medication? Yes  Has the patient been seen for an appointment in the last year OR does the patient have an upcoming appointment? Yes  Can we respond through MyChart? No  Agent: Please be advised that Rx refills may take up to 3 business days. We ask that you follow-up with your pharmacy.

## 2025-02-19 ENCOUNTER — Ambulatory Visit: Admitting: Family Medicine

## 2025-03-27 ENCOUNTER — Ambulatory Visit
